# Patient Record
Sex: Female | Born: 1985 | Race: White | Hispanic: No | Marital: Married | Smoking: Former smoker
Health system: Southern US, Community
[De-identification: ages and names within clinical notes are randomized; demographics above are authoritative.]

## PROBLEM LIST (undated history)

## (undated) ENCOUNTER — Inpatient Hospital Stay (HOSPITAL_COMMUNITY): Payer: Self-pay

## (undated) DIAGNOSIS — F32A Depression, unspecified: Secondary | ICD-10-CM

## (undated) DIAGNOSIS — Z789 Other specified health status: Secondary | ICD-10-CM

## (undated) DIAGNOSIS — F329 Major depressive disorder, single episode, unspecified: Secondary | ICD-10-CM

## (undated) HISTORY — PX: TUBAL LIGATION: SHX77

## (undated) HISTORY — DX: Depression, unspecified: F32.A

## (undated) HISTORY — DX: Major depressive disorder, single episode, unspecified: F32.9

## (undated) HISTORY — DX: Other specified health status: Z78.9

## (undated) HISTORY — PX: WISDOM TOOTH EXTRACTION: SHX21

---

## 2012-09-14 ENCOUNTER — Emergency Department (INDEPENDENT_AMBULATORY_CARE_PROVIDER_SITE_OTHER)
Admission: EM | Admit: 2012-09-14 | Discharge: 2012-09-14 | Disposition: A | Discharge status: Home / Self Care | Payer: Managed Care, Other (non HMO) | Source: Home / Self Care | Attending: Family Medicine | Admitting: Family Medicine

## 2012-09-14 ENCOUNTER — Encounter: Payer: Self-pay | Admitting: *Deleted

## 2012-09-14 DIAGNOSIS — J069 Acute upper respiratory infection, unspecified: Secondary | ICD-10-CM

## 2012-09-14 DIAGNOSIS — R062 Wheezing: Secondary | ICD-10-CM

## 2012-09-14 MED ORDER — BENZONATATE 100 MG PO CAPS: 100.0000 mg | ORAL_CAPSULE | Freq: Three times a day (TID) | ORAL | Status: DC | PRN

## 2012-09-14 MED ORDER — METHYLPREDNISOLONE ACETATE 80 MG/ML IJ SUSP
80.0000 mg | Freq: Once | INTRAMUSCULAR | Status: AC
  Administered 2012-09-14: 80 mg via INTRAMUSCULAR

## 2012-09-14 NOTE — ED Notes (Signed)
Pt c/o HA and fatigue x 4 days. She also c/o productive cough x 2 days. Denies fever.

## 2012-09-14 NOTE — ED Provider Notes (Signed)
History     CSN: [id number]  Arrival date & time 09/14/12  2956   First MD Initiated Contact with Patient 09/14/12 [phone]      Chief Complaint  Patient presents with  . Cough   HPI  URI Symptoms Onset: 3-4 days  Description: rhinorrhea, nasal congestion, cough  Modifying factors:  Prior smoker (quit 1 year ago), has had some mild wheezing.   Symptoms Nasal discharge: yes Fever: no Sore throat: no Cough: yes Wheezing: yes Ear pain: yes GI symptoms: mild nausea  Sick contacts: yes  Red Flags  Stiff neck: no Dyspnea: no Rash: no Swallowing difficulty: no  Sinusitis Risk Factors Headache/face pain: no Double sickening: no tooth pain: no  Allergy Risk Factors Sneezing: yes Itchy scratchy throat: yes Seasonal symptoms: no  Flu Risk Factors Headache: no muscle aches: no severe fatigue: no   History reviewed. No pertinent past medical history.  History reviewed. No pertinent past surgical history.  Family History  Problem Relation Age of Onset  . Ulcerative colitis Father   . Seizures Sister     History  Substance Use Topics  . Smoking status: Former Games developer  . Smokeless tobacco: Not on file  . Alcohol Use: No    OB History   Grav Para Term Preterm Abortions TAB SAB Ect Mult Living                  Review of Systems  All other systems reviewed and are negative.    Allergies  Review of patient's allergies indicates no known allergies.  Home Medications   Current Outpatient Rx  Name  Route  Sig  Dispense  Refill  . benzonatate (TESSALON PERLES) 100 MG capsule   Oral   Take 1 capsule (100 mg total) by mouth 3 (three) times daily as needed for cough.   20 capsule   0     BP 136/72  Pulse 66  Temp(Src) 98.8 F (37.1 C) (Oral)  Ht 5\' 3"  (1.6 m)  Wt 156 lb (70.761 kg)  BMI 27.64 kg/m2  SpO2 99%  LMP 08/31/2012  Physical Exam  Constitutional: She appears well-developed and well-nourished.  HENT:  Head: Normocephalic and  atraumatic.  Right Ear: External ear normal.  Left Ear: External ear normal.  +nasal erythema, rhinorrhea bilaterally, + post oropharyngeal erythema    Eyes: Conjunctivae are normal. Pupils are equal, round, and reactive to light.  Neck: Normal range of motion. Neck supple.  Cardiovascular: Normal rate, regular rhythm and normal heart sounds.   Pulmonary/Chest: Effort normal.  Faint wheezes in bases    Abdominal: Soft.  Musculoskeletal: Normal range of motion.  Neurological: She is alert.  Skin: Skin is warm.    ED Course  Procedures (including critical care time)  Labs Reviewed - No data to display No results found.   1. URI (upper respiratory infection)   2. Wheezing       MDM  Suspect viral URI with some allergic overlap.  Discussed supportive care and infectious red flags. No clinical indication for abx currently.  Depomedrol for wheezing.  Tessalon perles for cough.  Discussed infectious and resp red flags.  Follow up as needed.     The patient and/or caregiver has been counseled thoroughly with regard to treatment plan and/or medications prescribed including dosage, schedule, interactions, rationale for use, and possible side effects and they verbalize understanding. Diagnoses and expected course of recovery discussed and will return if not improved as expected or if the condition  worsens. Patient and/or caregiver verbalized understanding.             Doree Albee, MD 09/14/12 [phone]

## 2012-11-30 ENCOUNTER — Emergency Department (INDEPENDENT_AMBULATORY_CARE_PROVIDER_SITE_OTHER)
Admission: EM | Admit: 2012-11-30 | Discharge: 2012-11-30 | Disposition: A | Discharge status: Home / Self Care | Payer: Managed Care, Other (non HMO) | Source: Home / Self Care | Attending: Family Medicine | Admitting: Family Medicine

## 2012-11-30 ENCOUNTER — Encounter: Payer: Self-pay | Admitting: *Deleted

## 2012-11-30 DIAGNOSIS — R6889 Other general symptoms and signs: Secondary | ICD-10-CM

## 2012-11-30 DIAGNOSIS — R509 Fever, unspecified: Secondary | ICD-10-CM

## 2012-11-30 DIAGNOSIS — J029 Acute pharyngitis, unspecified: Secondary | ICD-10-CM

## 2012-11-30 DIAGNOSIS — J111 Influenza due to unidentified influenza virus with other respiratory manifestations: Secondary | ICD-10-CM

## 2012-11-30 LAB — POCT RAPID STREP A (OFFICE): Rapid Strep A Screen: NEGATIVE

## 2012-11-30 MED ORDER — OSELTAMIVIR PHOSPHATE 75 MG PO CAPS: 75.0000 mg | ORAL_CAPSULE | Freq: Two times a day (BID) | ORAL | Status: DC

## 2012-11-30 NOTE — ED Notes (Signed)
Pt c/o sore throat, body aches, and chills x 3 days. Denies fever.

## 2012-11-30 NOTE — ED Provider Notes (Signed)
History     CSN: [id number]  Arrival date & time 11/30/12  1333   First MD Initiated Contact with Patient 11/30/12 1353      Chief Complaint  Patient presents with  . Sore Throat  . Generalized Body Aches  . Chills   HPI URI Symptoms Onset: 2 days  Description: rhinorrhea, congestion, generalized malaise, chills  Modifying factors: Has not had flu shot    Symptoms Nasal discharge: yes Fever: yes Sore throat: no Cough: yes Wheezing: no Ear pain: no GI symptoms: no Sick contacts: no  Red Flags  Stiff neck: no Dyspnea: nn Rash: no Swallowing difficulty: no  Sinusitis Risk Factors Headache/face pain: no Double sickening: no tooth pain: no  Allergy Risk Factors Sneezing: no Itchy scratchy throat: no Seasonal symptoms: no  Flu Risk Factors Headache: yes muscle aches: yes severe fatigue: yes     History reviewed. No pertinent past medical history.  History reviewed. No pertinent past surgical history.  Family History  Problem Relation Age of Onset  . Ulcerative colitis Father   . Seizures Sister     History  Substance Use Topics  . Smoking status: Former Games developer  . Smokeless tobacco: Not on file  . Alcohol Use: No    OB History   Grav Para Term Preterm Abortions TAB SAB Ect Mult Living                  Review of Systems  All other systems reviewed and are negative.    Allergies  Review of patient's allergies indicates no known allergies.  Home Medications   Current Outpatient Rx  Name  Route  Sig  Dispense  Refill  . benzonatate (TESSALON PERLES) 100 MG capsule   Oral   Take 1 capsule (100 mg total) by mouth 3 (three) times daily as needed for cough.   20 capsule   0     BP 121/76  Pulse 93  Temp(Src) 99.6 F (37.6 C) (Oral)  Resp 16  Wt 157 lb (71.215 kg)  BMI 27.82 kg/m2  SpO2 99%  Physical Exam  Constitutional:  Mildly ill appearing    HENT:  Head: Normocephalic and atraumatic.  Eyes: Conjunctivae are normal.  Pupils are equal, round, and reactive to light.  Neck: Normal range of motion. Neck supple.  Cardiovascular: Normal rate and regular rhythm.   Pulmonary/Chest: Effort normal.  Abdominal: Soft.  Musculoskeletal: Normal range of motion.  Neurological: She is alert.  Skin: Skin is warm.    ED Course  Procedures (including critical care time)  Labs Reviewed  STREP A DNA PROBE   Narrative:    Performed at:  [facility]                [address]                [place], [place] [address]  POCT RAPID STREP A (OFFICE)  POCT MONO SCREEN ([facility])   No results found.   1. Flu-like symptoms       MDM  Flu like illness Rapid strep and mono negative.  Tamiflu  Tessalon perles.  Discussed infectious and resp red flags.  Follow up as needed.     The patient and/or caregiver has been counseled thoroughly with regard to treatment plan and/or medications prescribed including dosage, schedule, interactions, rationale for use, and possible side effects and they verbalize understanding. Diagnoses and expected course of recovery discussed and will return if not  improved as expected or if the condition worsens. Patient and/or caregiver verbalized understanding.             Doree Albee, MD 12/09/12 [phone]

## 2012-12-01 LAB — STREP A DNA PROBE: GASP: NEGATIVE

## 2012-12-03 ENCOUNTER — Telehealth: Payer: Self-pay | Admitting: Emergency Medicine

## 2013-06-29 ENCOUNTER — Encounter: Payer: Self-pay | Admitting: Emergency Medicine

## 2013-06-29 ENCOUNTER — Emergency Department (INDEPENDENT_AMBULATORY_CARE_PROVIDER_SITE_OTHER)
Admission: EM | Admit: 2013-06-29 | Discharge: 2013-06-29 | Disposition: A | Discharge status: Home / Self Care | Payer: Managed Care, Other (non HMO) | Source: Home / Self Care | Attending: Family Medicine | Admitting: Family Medicine

## 2013-06-29 DIAGNOSIS — J029 Acute pharyngitis, unspecified: Secondary | ICD-10-CM

## 2013-06-29 LAB — POCT RAPID STREP A (OFFICE): Rapid Strep A Screen: NEGATIVE

## 2013-06-29 MED ORDER — PREDNISONE 20 MG PO TABS: 20.0000 mg | ORAL_TABLET | Freq: Two times a day (BID) | ORAL | Status: DC

## 2013-06-29 NOTE — ED Notes (Signed)
Reports onset sore throat within past 24 hours. Has not had Flu vaccination this season. Did get treated for Flu 11/2012.

## 2013-06-29 NOTE — ED Provider Notes (Signed)
CSN: [id number]     Arrival date & time 06/29/13  1340 History   First MD Initiated Contact with Patient 06/29/13 1506     Chief Complaint  Patient presents with  . Sore Throat     HPI Comments: Patient developed a right-side sore throat yesterday.  She has been fatigued, with headache and sweats.  The history is provided by the patient.    History reviewed. No pertinent past medical history. History reviewed. No pertinent past surgical history. Family History  Problem Relation Age of Onset  . Ulcerative colitis Father   . Seizures Sister    History  Substance Use Topics  . Smoking status: Former Games developer  . Smokeless tobacco: Not on file  . Alcohol Use: No   OB History   Grav Para Term Preterm Abortions TAB SAB Ect Mult Living                 Review of Systems + sore throat No cough No pleuritic pain No wheezing No nasal congestion ? post-nasal drainage No sinus pain/pressure No itchy/red eyes ? earache No hemoptysis No SOB No fever/chills; + sweats No nausea No vomiting No abdominal pain No diarrhea No urinary symptoms No skin rash + fatigue No myalgias + headache Used OTC meds without relief  Allergies  Review of patient's allergies indicates no known allergies.  Home Medications   Current Outpatient Rx  Name  Route  Sig  Dispense  Refill  . benzonatate (TESSALON PERLES) 100 MG capsule   Oral   Take 1 capsule (100 mg total) by mouth 3 (three) times daily as needed for cough.   20 capsule   0   . oseltamivir (TAMIFLU) 75 MG capsule   Oral   Take 1 capsule (75 mg total) by mouth 2 (two) times daily.   10 capsule   0   . predniSONE (DELTASONE) 20 MG tablet   Oral   Take 1 tablet (20 mg total) by mouth 2 (two) times daily.   10 tablet   0    BP 119/79  Pulse 66  Temp(Src) 98.1 F (36.7 C) (Oral)  Resp 16  Ht 5\' 4"  (1.626 m)  Wt 150 lb (68.04 kg)  BMI 25.73 kg/m2  SpO2 100%  LMP 05/31/2013 Physical Exam Nursing notes and Vital  Signs reviewed. Appearance:  Patient appears healthy, stated age, and in no acute distress Eyes:  Pupils are equal, round, and reactive to light and accomodation.  Extraocular movement is intact.  Conjunctivae are not inflamed  Ears:  Canals normal.  Tympanic membranes normal.  Nose:  Mildly congested turbinates.  No sinus tenderness.   Pharynx:  Normal Neck:  Supple.  Nontender shotty posterior nodes are palpated bilaterally  Lungs:  Clear to auscultation.  Breath sounds are equal.  Heart:  Regular rate and rhythm without murmurs, rubs, or gallops.  Abdomen:  Nontender without masses or hepatosplenomegaly.  Bowel sounds are present.  No CVA or flank tenderness.  Extremities:  No edema.  No calf tenderness Skin:  No rash present.   ED Course  Procedures  none    Labs Reviewed  STREP A DNA PROBE  POCT RAPID STREP A (OFFICE) negative         MDM   1. Acute pharyngitis; suspect early viral URI    Throat culture pending. Try warm salt water gargles. If cold like symptoms begin: Take Mucinex D (guaifenesin with decongestant) twice daily for congestion.  Increase fluid intake, rest. May  use Afrin nasal spray (or generic oxymetazoline) twice daily for about 5 days.  Also recommend using saline nasal spray several times daily and saline nasal irrigation (AYR is a common brand) May take Delsym Cough Suppressant at bedtime for nighttime cough.  Stop all antihistamines for now, and other non-prescription cough/cold preparations. Follow-up with family doctor if not improving about 7 to10 days.     Lattie Haw, MD 07/01/13 2003

## 2014-08-04 NOTE — L&D Delivery Note (Signed)
Patient is 29 y.o. [id number] 7994w3d admitted for PD-IOL, uncomplicated pregnancy. Patient progressed well and had epidural placed. She required a manual extraction of placenta and the specimen appeared intact. Of note the patient was noted to be a marginal placenta on US.   Delivery Note At 9:04 PM a viable female was delivered via Vaginal, Spontaneous Delivery (Presentation: Right Occiput Anterior).  APGAR: 9, 9; weight 8 lb 14.7 oz (4045 g).   Placenta status: Intact, Manual removal.  Cord: 3 vessels with the following complications: None.  Cord pH: n/a  Anesthesia: Epidural  Episiotomy: None Lacerations: None Suture Repair: NA Est. Blood Loss (mL): 150  Mom to postpartum.  Baby to Couplet care / Skin to Skin.  Isa RankinKimberly Niles Raynaldo Falco 05/22/2015, 12:20 AM

## 2014-09-25 ENCOUNTER — Other Ambulatory Visit: Payer: Self-pay | Admitting: Advanced Practice Midwife

## 2014-09-25 ENCOUNTER — Ambulatory Visit (INDEPENDENT_AMBULATORY_CARE_PROVIDER_SITE_OTHER): Payer: Managed Care, Other (non HMO) | Admitting: Advanced Practice Midwife

## 2014-09-25 ENCOUNTER — Encounter: Payer: Self-pay | Admitting: Advanced Practice Midwife

## 2014-09-25 VITALS — BP 134/77 | HR 67 | Wt 144.0 lb

## 2014-09-25 DIAGNOSIS — Z124 Encounter for screening for malignant neoplasm of cervix: Secondary | ICD-10-CM

## 2014-09-25 DIAGNOSIS — Z3481 Encounter for supervision of other normal pregnancy, first trimester: Secondary | ICD-10-CM | POA: Diagnosis not present

## 2014-09-25 DIAGNOSIS — Z23 Encounter for immunization: Secondary | ICD-10-CM

## 2014-09-25 DIAGNOSIS — Z3491 Encounter for supervision of normal pregnancy, unspecified, first trimester: Secondary | ICD-10-CM | POA: Insufficient documentation

## 2014-09-25 LAB — OB RESULTS CONSOLE GC/CHLAMYDIA
Chlamydia: NEGATIVE
GC PROBE AMP, GENITAL: NEGATIVE

## 2014-09-25 MED ORDER — INFLUENZA VAC SPLIT QUAD 0.5 ML IM SUSY
0.5000 mL | PREFILLED_SYRINGE | Freq: Once | INTRAMUSCULAR | Status: AC
  Administered 2014-09-25: 0.5 mL via INTRAMUSCULAR

## 2014-09-25 NOTE — Patient Instructions (Signed)
First Trimester of Pregnancy The first trimester of pregnancy is from week 1 until the end of week 12 (months 1 through 3). A week after a sperm fertilizes an egg, the egg will implant on the wall of the uterus. This embryo will begin to develop into a baby. Genes from you and your partner are forming the baby. The female genes determine whether the baby is a boy or a girl. At 6-8 weeks, the eyes and face are formed, and the heartbeat can be seen on ultrasound. At the end of 12 weeks, all the baby's organs are formed.  Now that you are pregnant, you will want to do everything you can to have a healthy baby. Two of the most important things are to get good prenatal care and to follow your health care provider's instructions. Prenatal care is all the medical care you receive before the baby's birth. This care will help prevent, find, and treat any problems during the pregnancy and childbirth. BODY CHANGES Your body goes through many changes during pregnancy. The changes vary from woman to woman.   You may gain or lose a couple of pounds at first.  You may feel sick to your stomach (nauseous) and throw up (vomit). If the vomiting is uncontrollable, call your health care provider.  You may tire easily.  You may develop headaches that can be relieved by medicines approved by your health care provider.  You may urinate more often. Painful urination may mean you have a bladder infection.  You may develop heartburn as a result of your pregnancy.  You may develop constipation because certain hormones are causing the muscles that push waste through your intestines to slow down.  You may develop hemorrhoids or swollen, bulging veins (varicose veins).  Your breasts may begin to grow larger and become tender. Your nipples may stick out more, and the tissue that surrounds them (areola) may become darker.  Your gums may bleed and may be sensitive to brushing and flossing.  Dark spots or blotches (chloasma,  mask of pregnancy) may develop on your face. This will likely fade after the baby is born.  Your menstrual periods will stop.  You may have a loss of appetite.  You may develop cravings for certain kinds of food.  You may have changes in your emotions from day to day, such as being excited to be pregnant or being concerned that something may go wrong with the pregnancy and baby.  You may have more vivid and strange dreams.  You may have changes in your hair. These can include thickening of your hair, rapid growth, and changes in texture. Some women also have hair loss during or after pregnancy, or hair that feels dry or thin. Your hair will most likely return to normal after your baby is born. WHAT TO EXPECT AT YOUR PRENATAL VISITS During a routine prenatal visit:  You will be weighed to make sure you and the baby are growing normally.  Your blood pressure will be taken.  Your abdomen will be measured to track your baby's growth.  The fetal heartbeat will be listened to starting around week 10 or 12 of your pregnancy.  Test results from any previous visits will be discussed. Your health care provider may ask you:  How you are feeling.  If you are feeling the baby move.  If you have had any abnormal symptoms, such as leaking fluid, bleeding, severe headaches, or abdominal cramping.  If you have any questions. Other tests   that may be performed during your first trimester include:  Blood tests to find your blood type and to check for the presence of any previous infections. They will also be used to check for low iron levels (anemia) and Rh antibodies. Later in the pregnancy, blood tests for diabetes will be done along with other tests if problems develop.  Urine tests to check for infections, diabetes, or protein in the urine.  An ultrasound to confirm the proper growth and development of the baby.  An amniocentesis to check for possible genetic problems.  Fetal screens for  spina bifida and Down syndrome.  You may need other tests to make sure you and the baby are doing well. HOME CARE INSTRUCTIONS  Medicines  Follow your health care provider's instructions regarding medicine use. Specific medicines may be either safe or unsafe to take during pregnancy.  Take your prenatal vitamins as directed.  If you develop constipation, try taking a stool softener if your health care provider approves. Diet  Eat regular, well-balanced meals. Choose a variety of foods, such as meat or vegetable-based protein, fish, milk and low-fat dairy products, vegetables, fruits, and whole grain breads and cereals. Your health care provider will help you determine the amount of weight gain that is right for you.  Avoid raw meat and uncooked cheese. These carry germs that can cause birth defects in the baby.  Eating four or five small meals rather than three large meals a day may help relieve nausea and vomiting. If you start to feel nauseous, eating a few soda crackers can be helpful. Drinking liquids between meals instead of during meals also seems to help nausea and vomiting.  If you develop constipation, eat more high-fiber foods, such as fresh vegetables or fruit and whole grains. Drink enough fluids to keep your urine clear or pale yellow. Activity and Exercise  Exercise only as directed by your health care provider. Exercising will help you:  Control your weight.  Stay in shape.  Be prepared for labor and delivery.  Experiencing pain or cramping in the lower abdomen or low back is a good sign that you should stop exercising. Check with your health care provider before continuing normal exercises.  Try to avoid standing for long periods of time. Move your legs often if you must stand in one place for a long time.  Avoid heavy lifting.  Wear low-heeled shoes, and practice good posture.  You may continue to have sex unless your health care provider directs you  otherwise. Relief of Pain or Discomfort  Wear a good support bra for breast tenderness.   Take warm sitz baths to soothe any pain or discomfort caused by hemorrhoids. Use hemorrhoid cream if your health care provider approves.   Rest with your legs elevated if you have leg cramps or low back pain.  If you develop varicose veins in your legs, wear support hose. Elevate your feet for 15 minutes, 3-4 times a day. Limit salt in your diet. Prenatal Care  Schedule your prenatal visits by the twelfth week of pregnancy. They are usually scheduled monthly at first, then more often in the last 2 months before delivery.  Write down your questions. Take them to your prenatal visits.  Keep all your prenatal visits as directed by your health care provider. Safety  Wear your seat belt at all times when driving.  Make a list of emergency phone numbers, including numbers for family, friends, the hospital, and police and fire departments. General Tips    Ask your health care provider for a referral to a local prenatal education class. Begin classes no later than at the beginning of month 6 of your pregnancy.  Ask for help if you have counseling or nutritional needs during pregnancy. Your health care provider can offer advice or refer you to specialists for help with various needs.  Do not use hot tubs, steam rooms, or saunas.  Do not douche or use tampons or scented sanitary pads.  Do not cross your legs for long periods of time.  Avoid cat litter boxes and soil used by cats. These carry germs that can cause birth defects in the baby and possibly loss of the fetus by miscarriage or stillbirth.  Avoid all smoking, herbs, alcohol, and medicines not prescribed by your health care provider. Chemicals in these affect the formation and growth of the baby.  Schedule a dentist appointment. At home, brush your teeth with a soft toothbrush and be gentle when you floss. SEEK MEDICAL CARE IF:   You have  dizziness.  You have mild pelvic cramps, pelvic pressure, or nagging pain in the abdominal area.  You have persistent nausea, vomiting, or diarrhea.  You have a bad smelling vaginal discharge.  You have pain with urination.  You notice increased swelling in your face, hands, legs, or ankles. SEEK IMMEDIATE MEDICAL CARE IF:   You have a fever.  You are leaking fluid from your vagina.  You have spotting or bleeding from your vagina.  You have severe abdominal cramping or pain.  You have rapid weight gain or loss.  You vomit blood or material that looks like coffee grounds.  You are exposed to German measles and have never had them.  You are exposed to fifth disease or chickenpox.  You develop a severe headache.  You have shortness of breath.  You have any kind of trauma, such as from a fall or a car accident. Document Released: 07/15/2001 Document Revised: 12/05/2013 Document Reviewed: 05/31/2013 Exit[employer] Patient Information 2015 [employer]. This information is not intended to replace advice given to you by your health care provider. Make sure you discuss any questions you have with your health care provider.  

## 2014-09-25 NOTE — Addendum Note (Signed)
Addended by: Granville LewisLARK, Gari Trovato L on: 09/25/2014 01:28 PM   Modules accepted: Orders

## 2014-09-25 NOTE — Progress Notes (Signed)
Pt has irregular periods and Beside U/S today shows IUP with CRL of12.811mm and GA is 6639w3d

## 2014-09-25 NOTE — Progress Notes (Signed)
   Subjective:    Bonnie White is a G2P1001 8356w3d by US today being seen today for her first obstetrical visit. Irreg uncertain LMP.   Her obstetrical history is significant for nothing. . Patient does intend to breast feed. Pregnancy history fully reviewed.  Patient reports no bleeding and no cramping.  Filed Vitals:   09/25/14 1120  BP: 134/77  Pulse: 67  Weight: 144 lb (65.318 kg)    HISTORY: OB History  Gravida Para Term Preterm AB SAB TAB Ectopic Multiple Living  2 1 1       1     # Outcome Date GA Lbr Len/2nd Weight Sex Delivery Anes PTL Lv  2 Current           1 Term 06/26/08    F Vag-Spont  Y Y     No past medical history on file. Past Surgical History  Procedure Laterality Date  . Wisdom tooth extraction     Family History  Problem Relation Age of Onset  . Ulcerative colitis Father   . Seizures Sister      Exam    Uterus:   8-week size. Pos cardiac activity per US today.  Pelvic Exam:    Perineum: No Hemorrhoids, Normal Perineum   Vulva: normal   Vagina:  normal mucosa, normal discharge   pH: NA   Cervix: multiparous appearance and scant bleeding following Pap, Cervix anterior, displaced to left.   Adnexa: normal adnexa and no mass, fullness, tenderness   Bony Pelvis: average  System: Breast:  normal appearance, no masses or tenderness, Taught monthly breast self examination   Skin: normal coloration and turgor, no rashes    Neurologic: oriented, normal mood, grossly non-focal   Extremities: normal strength, tone, and muscle mass   HEENT sclera clear, anicteric   Mouth/Teeth dental hygiene good   Neck supple and no masses   Cardiovascular: regular rate and rhythm   Respiratory:  appears well, vitals normal, no respiratory distress, acyanotic, normal RR, chest clear, no wheezing, crepitations, rhonchi, normal symmetric air entry   Abdomen: soft, non-tender; bowel sounds normal; no masses,  no organomegaly   Urinary: urethral meatus normal       Assessment:    Pregnancy: G2P1001 Patient Active Problem List   Diagnosis Date Noted  . Supervision of normal pregnancy in first trimester 09/25/2014        Plan:     Initial labs drawn. Prenatal vitamins. Problem list reviewed and updated. Genetic Screening discussed First Screen: ordered.  Ultrasound discussed; fetal survey: requested.  Follow up in 4 weeks. 50% of 30 min visit spent on counseling and coordination of care.  Flu vaccine today. Desires Waterbirth. Info given. Discussed class requirement and contraindications.    Dorathy KinsmanSMITH, Bonnie White 09/25/2014

## 2014-09-26 LAB — OBSTETRIC PANEL
Antibody Screen: NEGATIVE
Basophils Absolute: 0 10*3/uL (ref 0.0–0.1)
Basophils Relative: 0 % (ref 0–1)
Eosinophils Absolute: 0.1 10*3/uL (ref 0.0–0.7)
Eosinophils Relative: 1 % (ref 0–5)
HCT: 36.4 % (ref 36.0–46.0)
HEMOGLOBIN: 11.9 g/dL — AB (ref 12.0–15.0)
HEP B S AG: NEGATIVE
LYMPHS ABS: 2.2 10*3/uL (ref 0.7–4.0)
LYMPHS PCT: 32 % (ref 12–46)
MCH: 31.2 pg (ref 26.0–34.0)
MCHC: 32.7 g/dL (ref 30.0–36.0)
MCV: 95.3 fL (ref 78.0–100.0)
MPV: 11.3 fL (ref 8.6–12.4)
Monocytes Absolute: 0.4 10*3/uL (ref 0.1–1.0)
Monocytes Relative: 6 % (ref 3–12)
NEUTROS PCT: 61 % (ref 43–77)
Neutro Abs: 4.3 10*3/uL (ref 1.7–7.7)
Platelets: 191 10*3/uL (ref 150–400)
RBC: 3.82 MIL/uL — AB (ref 3.87–5.11)
RDW: 12.8 % (ref 11.5–15.5)
RUBELLA: 1.99 {index} — AB (ref <0.90)
Rh Type: POSITIVE
WBC: 7 10*3/uL (ref 4.0–10.5)

## 2014-09-26 LAB — GC/CHLAMYDIA PROBE AMP
CT Probe RNA: NEGATIVE
GC Probe RNA: NEGATIVE

## 2014-09-26 LAB — CYSTIC FIBROSIS DIAGNOSTIC STUDY

## 2014-09-26 LAB — HIV ANTIBODY (ROUTINE TESTING W REFLEX): HIV: NONREACTIVE

## 2014-09-27 LAB — CULTURE, URINE COMPREHENSIVE
Colony Count: NO GROWTH
ORGANISM ID, BACTERIA: NO GROWTH

## 2014-09-28 LAB — CYTOLOGY - PAP

## 2014-10-23 ENCOUNTER — Ambulatory Visit (INDEPENDENT_AMBULATORY_CARE_PROVIDER_SITE_OTHER): Payer: Managed Care, Other (non HMO) | Admitting: Advanced Practice Midwife

## 2014-10-23 VITALS — BP 123/76 | HR 79 | Wt 150.0 lb

## 2014-10-23 DIAGNOSIS — Z3491 Encounter for supervision of normal pregnancy, unspecified, first trimester: Secondary | ICD-10-CM

## 2014-10-23 DIAGNOSIS — Z3481 Encounter for supervision of other normal pregnancy, first trimester: Secondary | ICD-10-CM

## 2014-10-23 NOTE — Patient Instructions (Signed)
Second Trimester of Pregnancy The second trimester is from week 13 through week 28, months 4 through 6. The second trimester is often a time when you feel your best. Your body has also adjusted to being pregnant, and you begin to feel better physically. Usually, morning sickness has lessened or quit completely, you may have more energy, and you may have an increase in appetite. The second trimester is also a time when the fetus is growing rapidly. At the end of the sixth month, the fetus is about 9 inches long and weighs about 1 pounds. You will likely begin to feel the baby move (quickening) between 18 and 20 weeks of the pregnancy. BODY CHANGES Your body goes through many changes during pregnancy. The changes vary from woman to woman.   Your weight will continue to increase. You will notice your lower abdomen bulging out.  You may begin to get stretch marks on your hips, abdomen, and breasts.  You may develop headaches that can be relieved by medicines approved by your health care provider.  You may urinate more often because the fetus is pressing on your bladder.  You may develop or continue to have heartburn as a result of your pregnancy.  You may develop constipation because certain hormones are causing the muscles that push waste through your intestines to slow down.  You may develop hemorrhoids or swollen, bulging veins (varicose veins).  You may have back pain because of the weight gain and pregnancy hormones relaxing your joints between the bones in your pelvis and as a result of a shift in weight and the muscles that support your balance.  Your breasts will continue to grow and be tender.  Your gums may bleed and may be sensitive to brushing and flossing.  Dark spots or blotches (chloasma, mask of pregnancy) may develop on your face. This will likely fade after the baby is born.  A dark line from your belly button to the pubic area (linea nigra) may appear. This will likely fade  after the baby is born.  You may have changes in your hair. These can include thickening of your hair, rapid growth, and changes in texture. Some women also have hair loss during or after pregnancy, or hair that feels dry or thin. Your hair will most likely return to normal after your baby is born. WHAT TO EXPECT AT YOUR PRENATAL VISITS During a routine prenatal visit:  You will be weighed to make sure you and the fetus are growing normally.  Your blood pressure will be taken.  Your abdomen will be measured to track your baby's growth.  The fetal heartbeat will be listened to.  Any test results from the previous visit will be discussed. Your health care provider may ask you:  How you are feeling.  If you are feeling the baby move.  If you have had any abnormal symptoms, such as leaking fluid, bleeding, severe headaches, or abdominal cramping.  If you have any questions. Other tests that may be performed during your second trimester include:  Blood tests that check for:  Low iron levels (anemia).  Gestational diabetes (between 24 and 28 weeks).  Rh antibodies.  Urine tests to check for infections, diabetes, or protein in the urine.  An ultrasound to confirm the proper growth and development of the baby.  An amniocentesis to check for possible genetic problems.  Fetal screens for spina bifida and Down syndrome. HOME CARE INSTRUCTIONS   Avoid all smoking, herbs, alcohol, and unprescribed   drugs. These chemicals affect the formation and growth of the baby.  Follow your health care provider's instructions regarding medicine use. There are medicines that are either safe or unsafe to take during pregnancy.  Exercise only as directed by your health care provider. Experiencing uterine cramps is a good sign to stop exercising.  Continue to eat regular, healthy meals.  Wear a good support bra for breast tenderness.  Do not use hot tubs, steam rooms, or saunas.  Wear your  seat belt at all times when driving.  Avoid raw meat, uncooked cheese, cat litter boxes, and soil used by cats. These carry germs that can cause birth defects in the baby.  Take your prenatal vitamins.  Try taking a stool softener (if your health care provider approves) if you develop constipation. Eat more high-fiber foods, such as fresh vegetables or fruit and whole grains. Drink plenty of fluids to keep your urine clear or pale yellow.  Take warm sitz baths to soothe any pain or discomfort caused by hemorrhoids. Use hemorrhoid cream if your health care provider approves.  If you develop varicose veins, wear support hose. Elevate your feet for 15 minutes, 3-4 times a day. Limit salt in your diet.  Avoid heavy lifting, wear low heel shoes, and practice good posture.  Rest with your legs elevated if you have leg cramps or low back pain.  Visit your dentist if you have not gone yet during your pregnancy. Use a soft toothbrush to brush your teeth and be gentle when you floss.  A sexual relationship may be continued unless your health care provider directs you otherwise.  Continue to go to all your prenatal visits as directed by your health care provider. SEEK MEDICAL CARE IF:   You have dizziness.  You have mild pelvic cramps, pelvic pressure, or nagging pain in the abdominal area.  You have persistent nausea, vomiting, or diarrhea.  You have a bad smelling vaginal discharge.  You have pain with urination. SEEK IMMEDIATE MEDICAL CARE IF:   You have a fever.  You are leaking fluid from your vagina.  You have spotting or bleeding from your vagina.  You have severe abdominal cramping or pain.  You have rapid weight gain or loss.  You have shortness of breath with chest pain.  You notice sudden or extreme swelling of your face, hands, ankles, feet, or legs.  You have not felt your baby move in over an hour.  You have severe headaches that do not go away with  medicine.  You have vision changes. Document Released: 07/15/2001 Document Revised: 07/26/2013 Document Reviewed: 09/21/2012 [employer] Patient Information 2015 [employer]. This information is not intended to replace advice given to you by your health care provider. Make sure you discuss any questions you have with your health care provider.  

## 2014-10-23 NOTE — Progress Notes (Signed)
First trimester screen scheduled 3/28. Reviewed NOB labs.

## 2014-10-30 ENCOUNTER — Encounter (HOSPITAL_COMMUNITY): Payer: Self-pay

## 2014-10-30 ENCOUNTER — Ambulatory Visit (HOSPITAL_COMMUNITY)
Admission: RE | Admit: 2014-10-30 | Discharge: 2014-10-30 | Disposition: A | Discharge status: Home / Self Care | Payer: Managed Care, Other (non HMO) | Source: Ambulatory Visit | Attending: Advanced Practice Midwife | Admitting: Advanced Practice Midwife

## 2014-10-30 DIAGNOSIS — Z36 Encounter for antenatal screening of mother: Secondary | ICD-10-CM | POA: Insufficient documentation

## 2014-10-30 DIAGNOSIS — Z3A12 12 weeks gestation of pregnancy: Secondary | ICD-10-CM | POA: Diagnosis not present

## 2014-10-30 DIAGNOSIS — Z3491 Encounter for supervision of normal pregnancy, unspecified, first trimester: Secondary | ICD-10-CM

## 2014-11-03 DIAGNOSIS — Z3A12 12 weeks gestation of pregnancy: Secondary | ICD-10-CM | POA: Insufficient documentation

## 2014-11-03 DIAGNOSIS — Z3682 Encounter for antenatal screening for nuchal translucency: Secondary | ICD-10-CM | POA: Insufficient documentation

## 2014-11-07 ENCOUNTER — Encounter: Payer: Self-pay | Admitting: *Deleted

## 2014-11-07 ENCOUNTER — Other Ambulatory Visit (HOSPITAL_COMMUNITY): Payer: Self-pay | Admitting: Advanced Practice Midwife

## 2014-11-20 ENCOUNTER — Ambulatory Visit (INDEPENDENT_AMBULATORY_CARE_PROVIDER_SITE_OTHER): Payer: Managed Care, Other (non HMO) | Admitting: Advanced Practice Midwife

## 2014-11-20 VITALS — BP 110/63 | HR 63 | Wt 152.0 lb

## 2014-11-20 DIAGNOSIS — Z3492 Encounter for supervision of normal pregnancy, unspecified, second trimester: Secondary | ICD-10-CM

## 2014-11-20 DIAGNOSIS — Z36 Encounter for antenatal screening of mother: Secondary | ICD-10-CM

## 2014-11-20 NOTE — Progress Notes (Signed)
Doing well. Informed first screen normal Will do AFP only today.  Schedule US for 20 wks.

## 2014-11-20 NOTE — Patient Instructions (Signed)
Second Trimester of Pregnancy The second trimester is from week 13 through week 28, months 4 through 6. The second trimester is often a time when you feel your best. Your body has also adjusted to being pregnant, and you begin to feel better physically. Usually, morning sickness has lessened or quit completely, you may have more energy, and you may have an increase in appetite. The second trimester is also a time when the fetus is growing rapidly. At the end of the sixth month, the fetus is about 9 inches long and weighs about 1 pounds. You will likely begin to feel the baby move (quickening) between 18 and 20 weeks of the pregnancy. BODY CHANGES Your body goes through many changes during pregnancy. The changes vary from woman to woman.   Your weight will continue to increase. You will notice your lower abdomen bulging out.  You may begin to get stretch marks on your hips, abdomen, and breasts.  You may develop headaches that can be relieved by medicines approved by your health care provider.  You may urinate more often because the fetus is pressing on your bladder.  You may develop or continue to have heartburn as a result of your pregnancy.  You may develop constipation because certain hormones are causing the muscles that push waste through your intestines to slow down.  You may develop hemorrhoids or swollen, bulging veins (varicose veins).  You may have back pain because of the weight gain and pregnancy hormones relaxing your joints between the bones in your pelvis and as a result of a shift in weight and the muscles that support your balance.  Your breasts will continue to grow and be tender.  Your gums may bleed and may be sensitive to brushing and flossing.  Dark spots or blotches (chloasma, mask of pregnancy) may develop on your face. This will likely fade after the baby is born.  A dark line from your belly button to the pubic area (linea nigra) may appear. This will likely fade  after the baby is born.  You may have changes in your hair. These can include thickening of your hair, rapid growth, and changes in texture. Some women also have hair loss during or after pregnancy, or hair that feels dry or thin. Your hair will most likely return to normal after your baby is born. WHAT TO EXPECT AT YOUR PRENATAL VISITS During a routine prenatal visit:  You will be weighed to make sure you and the fetus are growing normally.  Your blood pressure will be taken.  Your abdomen will be measured to track your baby's growth.  The fetal heartbeat will be listened to.  Any test results from the previous visit will be discussed. Your health care provider may ask you:  How you are feeling.  If you are feeling the baby move.  If you have had any abnormal symptoms, such as leaking fluid, bleeding, severe headaches, or abdominal cramping.  If you have any questions. Other tests that may be performed during your second trimester include:  Blood tests that check for:  Low iron levels (anemia).  Gestational diabetes (between 24 and 28 weeks).  Rh antibodies.  Urine tests to check for infections, diabetes, or protein in the urine.  An ultrasound to confirm the proper growth and development of the baby.  An amniocentesis to check for possible genetic problems.  Fetal screens for spina bifida and Down syndrome. HOME CARE INSTRUCTIONS   Avoid all smoking, herbs, alcohol, and unprescribed   drugs. These chemicals affect the formation and growth of the baby.  Follow your health care provider's instructions regarding medicine use. There are medicines that are either safe or unsafe to take during pregnancy.  Exercise only as directed by your health care provider. Experiencing uterine cramps is a good sign to stop exercising.  Continue to eat regular, healthy meals.  Wear a good support bra for breast tenderness.  Do not use hot tubs, steam rooms, or saunas.  Wear your  seat belt at all times when driving.  Avoid raw meat, uncooked cheese, cat litter boxes, and soil used by cats. These carry germs that can cause birth defects in the baby.  Take your prenatal vitamins.  Try taking a stool softener (if your health care provider approves) if you develop constipation. Eat more high-fiber foods, such as fresh vegetables or fruit and whole grains. Drink plenty of fluids to keep your urine clear or pale yellow.  Take warm sitz baths to soothe any pain or discomfort caused by hemorrhoids. Use hemorrhoid cream if your health care provider approves.  If you develop varicose veins, wear support hose. Elevate your feet for 15 minutes, 3-4 times a day. Limit salt in your diet.  Avoid heavy lifting, wear low heel shoes, and practice good posture.  Rest with your legs elevated if you have leg cramps or low back pain.  Visit your dentist if you have not gone yet during your pregnancy. Use a soft toothbrush to brush your teeth and be gentle when you floss.  A sexual relationship may be continued unless your health care provider directs you otherwise.  Continue to go to all your prenatal visits as directed by your health care provider. SEEK MEDICAL CARE IF:   You have dizziness.  You have mild pelvic cramps, pelvic pressure, or nagging pain in the abdominal area.  You have persistent nausea, vomiting, or diarrhea.  You have a bad smelling vaginal discharge.  You have pain with urination. SEEK IMMEDIATE MEDICAL CARE IF:   You have a fever.  You are leaking fluid from your vagina.  You have spotting or bleeding from your vagina.  You have severe abdominal cramping or pain.  You have rapid weight gain or loss.  You have shortness of breath with chest pain.  You notice sudden or extreme swelling of your face, hands, ankles, feet, or legs.  You have not felt your baby move in over an hour.  You have severe headaches that do not go away with  medicine.  You have vision changes. Document Released: 07/15/2001 Document Revised: 07/26/2013 Document Reviewed: 09/21/2012 [employer] Patient Information 2015 [employer]. This information is not intended to replace advice given to you by your health care provider. Make sure you discuss any questions you have with your health care provider.  

## 2014-11-21 LAB — ALPHA FETOPROTEIN, MATERNAL
AFP: 54.6 ng/mL
CURR GEST AGE: 15.3 wks.days
MOM FOR AFP: 1.85
Open Spina bifida: NEGATIVE
Osb Risk: 1:1090 {titer}

## 2014-12-15 ENCOUNTER — Ambulatory Visit (HOSPITAL_COMMUNITY)
Admission: RE | Admit: 2014-12-15 | Discharge: 2014-12-15 | Disposition: A | Discharge status: Home / Self Care | Source: Ambulatory Visit | Attending: Advanced Practice Midwife | Admitting: Advanced Practice Midwife

## 2014-12-15 ENCOUNTER — Other Ambulatory Visit: Payer: Self-pay | Admitting: Advanced Practice Midwife

## 2014-12-15 DIAGNOSIS — Z3492 Encounter for supervision of normal pregnancy, unspecified, second trimester: Secondary | ICD-10-CM

## 2014-12-15 DIAGNOSIS — Z3A19 19 weeks gestation of pregnancy: Secondary | ICD-10-CM | POA: Diagnosis not present

## 2014-12-15 DIAGNOSIS — Z0372 Encounter for suspected placental problem ruled out: Secondary | ICD-10-CM | POA: Insufficient documentation

## 2014-12-15 DIAGNOSIS — Z36 Encounter for antenatal screening of mother: Secondary | ICD-10-CM | POA: Diagnosis not present

## 2014-12-15 DIAGNOSIS — Z3490 Encounter for supervision of normal pregnancy, unspecified, unspecified trimester: Secondary | ICD-10-CM | POA: Insufficient documentation

## 2014-12-18 ENCOUNTER — Ambulatory Visit (INDEPENDENT_AMBULATORY_CARE_PROVIDER_SITE_OTHER): Payer: Managed Care, Other (non HMO) | Admitting: Advanced Practice Midwife

## 2014-12-18 VITALS — BP 110/69 | HR 80 | Wt 156.0 lb

## 2014-12-18 DIAGNOSIS — Z3481 Encounter for supervision of other normal pregnancy, first trimester: Secondary | ICD-10-CM

## 2014-12-18 DIAGNOSIS — Z3491 Encounter for supervision of normal pregnancy, unspecified, first trimester: Secondary | ICD-10-CM

## 2014-12-18 DIAGNOSIS — Z0372 Encounter for suspected placental problem ruled out: Secondary | ICD-10-CM

## 2014-12-18 NOTE — Progress Notes (Signed)
US shows LVEIF, but genetic screening normal. There was concern for marginal previa on US, but final decision was that there was not a previa per Dr. Sherrie Georgeecker.  No F/U needed.

## 2014-12-18 NOTE — Progress Notes (Signed)
Low lying placenta on U/S

## 2014-12-18 NOTE — Patient Instructions (Addendum)
Bonnie White, [facility]  Second Trimester of Pregnancy The second trimester is from week 13 through week 28, months 4 through 6. The second trimester is often a time when you feel your best. Your body has also adjusted to being pregnant, and you begin to feel better physically. Usually, morning sickness has lessened or quit completely, you may have more energy, and you may have an increase in appetite. The second trimester is also a time when the fetus is growing rapidly. At the end of the sixth month, the fetus is about 9 inches long and weighs about 1 pounds. You will likely begin to feel the baby move (quickening) between 18 and 20 weeks of the pregnancy. BODY CHANGES Your body goes through many changes during pregnancy. The changes vary from woman to woman.   Your weight will continue to increase. You will notice your lower abdomen bulging out.  You may begin to get stretch marks on your hips, abdomen, and breasts.  You may develop headaches that can be relieved by medicines approved by your health care provider.  You may urinate more often because the fetus is pressing on your bladder.  You may develop or continue to have heartburn as a result of your pregnancy.  You may develop constipation because certain hormones are causing the muscles that push waste through your intestines to slow down.  You may develop hemorrhoids or swollen, bulging veins (varicose veins).  You may have back pain because of the weight gain and pregnancy hormones relaxing your joints between the bones in your pelvis and as a result of a shift in weight and the muscles that support your balance.  Your breasts will continue to grow and be tender.  Your gums may bleed and may be sensitive to brushing and flossing.  Dark spots or blotches (chloasma, mask of pregnancy) may develop on your face. This will likely fade after the baby is born.  A dark line from your belly button to the pubic area  (linea nigra) may appear. This will likely fade after the baby is born.  You may have changes in your hair. These can include thickening of your hair, rapid growth, and changes in texture. Some women also have hair loss during or after pregnancy, or hair that feels dry or thin. Your hair will most likely return to normal after your baby is born. WHAT TO EXPECT AT YOUR PRENATAL VISITS During a routine prenatal visit:  You will be weighed to make sure you and the fetus are growing normally.  Your blood pressure will be taken.  Your abdomen will be measured to track your baby's growth.  The fetal heartbeat will be listened to.  Any test results from the previous visit will be discussed. Your health care provider may ask you:  How you are feeling.  If you are feeling the baby move.  If you have had any abnormal symptoms, such as leaking fluid, bleeding, severe headaches, or abdominal cramping.  If you have any questions. Other tests that may be performed during your second trimester include:  Blood tests that check for:  Low iron levels (anemia).  Gestational diabetes (between 24 and 28 weeks).  Rh antibodies.  Urine tests to check for infections, diabetes, or protein in the urine.  An ultrasound to confirm the proper growth and development of the baby.  An amniocentesis to check for possible genetic problems.  Fetal screens for spina bifida and Down syndrome. HOME CARE INSTRUCTIONS  Avoid all smoking, herbs, alcohol, and unprescribed drugs. These chemicals affect the formation and growth of the baby.  Follow your health care provider's instructions regarding medicine use. There are medicines that are either safe or unsafe to take during pregnancy.  Exercise only as directed by your health care provider. Experiencing uterine cramps is a good sign to stop exercising.  Continue to eat regular, healthy meals.  Wear a good support bra for breast tenderness.  Do not use  hot tubs, steam rooms, or saunas.  Wear your seat belt at all times when driving.  Avoid raw meat, uncooked cheese, cat litter boxes, and soil used by cats. These carry germs that can cause birth defects in the baby.  Take your prenatal vitamins.  Try taking a stool softener (if your health care provider approves) if you develop constipation. Eat more high-fiber foods, such as fresh vegetables or fruit and whole grains. Drink plenty of fluids to keep your urine clear or pale yellow.  Take warm sitz baths to soothe any pain or discomfort caused by hemorrhoids. Use hemorrhoid cream if your health care provider approves.  If you develop varicose veins, wear support hose. Elevate your feet for 15 minutes, 3-4 times a day. Limit salt in your diet.  Avoid heavy lifting, wear low heel shoes, and practice good posture.  Rest with your legs elevated if you have leg cramps or low back pain.  Visit your dentist if you have not gone yet during your pregnancy. Use a soft toothbrush to brush your teeth and be gentle when you floss.  A sexual relationship may be continued unless your health care provider directs you otherwise.  Continue to go to all your prenatal visits as directed by your health care provider. SEEK MEDICAL CARE IF:   You have dizziness.  You have mild pelvic cramps, pelvic pressure, or nagging pain in the abdominal area.  You have persistent nausea, vomiting, or diarrhea.  You have a bad smelling vaginal discharge.  You have pain with urination. SEEK IMMEDIATE MEDICAL CARE IF:   You have a fever.  You are leaking fluid from your vagina.  You have spotting or bleeding from your vagina.  You have severe abdominal cramping or pain.  You have rapid weight gain or loss.  You have shortness of breath with chest pain.  You notice sudden or extreme swelling of your face, hands, ankles, feet, or legs.  You have not felt your baby move in over an hour.  You have severe  headaches that do not go away with medicine.  You have vision changes. Document Released: 07/15/2001 Document Revised: 07/26/2013 Document Reviewed: 09/21/2012 [facility] Patient Information 2015 [place], [place]. This information is not intended to replace advice given to you by your health care provider. Make sure you discuss any questions you have with your health care provider.  Breastfeeding Deciding to breastfeed is one of the best choices you can make for you and your baby. A change in hormones during pregnancy causes your breast tissue to grow and increases the number and size of your milk ducts. These hormones also allow proteins, sugars, and fats from your blood supply to make breast milk in your milk-producing glands. Hormones prevent breast milk from being released before your baby is born as well as prompt milk flow after birth. Once breastfeeding has begun, thoughts of your baby, as well as his or her sucking or crying, can stimulate the release of milk from your milk-producing glands.  BENEFITS OF  BREASTFEEDING For Your Baby  Your first milk (colostrum) helps your baby's digestive system function better.   There are antibodies in your milk that help your baby fight off infections.   Your baby has a lower incidence of asthma, allergies, and sudden infant death syndrome.   The nutrients in breast milk are better for your baby than infant formulas and are designed uniquely for your baby's needs.   Breast milk improves your baby's brain development.   Your baby is less likely to develop other conditions, such as childhood obesity, asthma, or type 2 diabetes mellitus.  For You   Breastfeeding helps to create a very special bond between you and your baby.   Breastfeeding is convenient. Breast milk is always available at the correct temperature and costs nothing.   Breastfeeding helps to burn calories and helps you lose the weight gained during pregnancy.   Breastfeeding  makes your uterus contract to its prepregnancy size faster and slows bleeding (lochia) after you give birth.   Breastfeeding helps to lower your risk of developing type 2 diabetes mellitus, osteoporosis, and breast or ovarian cancer later in life. SIGNS THAT YOUR BABY IS HUNGRY Early Signs of Hunger  Increased alertness or activity.  Stretching.  Movement of the head from side to side.  Movement of the head and opening of the mouth when the corner of the mouth or cheek is stroked (rooting).  Increased sucking sounds, smacking lips, cooing, sighing, or squeaking.  Hand-to-mouth movements.  Increased sucking of fingers or hands. Late Signs of Hunger  Fussing.  Intermittent crying. Extreme Signs of Hunger Signs of extreme hunger will require calming and consoling before your baby will be able to breastfeed successfully. Do not wait for the following signs of extreme hunger to occur before you initiate breastfeeding:   Restlessness.  A loud, strong cry.   Screaming. BREASTFEEDING BASICS Breastfeeding Initiation  Find a comfortable place to sit or lie down, with your neck and back well supported.  Place a pillow or rolled up blanket under your baby to bring him or her to the level of your breast (if you are seated). Nursing pillows are specially designed to help support your arms and your baby while you breastfeed.  Make sure that your baby's abdomen is facing your abdomen.   Gently massage your breast. With your fingertips, massage from your chest wall toward your nipple in a circular motion. This encourages milk flow. You may need to continue this action during the feeding if your milk flows slowly.  Support your breast with 4 fingers underneath and your thumb above your nipple. Make sure your fingers are well away from your nipple and your baby's mouth.   Stroke your baby's lips gently with your finger or nipple.   When your baby's mouth is open wide enough, quickly  bring your baby to your breast, placing your entire nipple and as much of the colored area around your nipple (areola) as possible into your baby's mouth.   More areola should be visible above your baby's upper lip than below the lower lip.   Your baby's tongue should be between his or her lower gum and your breast.   Ensure that your baby's mouth is correctly positioned around your nipple (latched). Your baby's lips should create a seal on your breast and be turned out (everted).  It is common for your baby to suck about 2-3 minutes in order to start the flow of breast milk. Latching Teaching your baby  how to latch on to your breast properly is very important. An improper latch can cause nipple pain and decreased milk supply for you and poor weight gain in your baby. Also, if your baby is not latched onto your nipple properly, he or she may swallow some air during feeding. This can make your baby fussy. Burping your baby when you switch breasts during the feeding can help to get rid of the air. However, teaching your baby to latch on properly is still the best way to prevent fussiness from swallowing air while breastfeeding. Signs that your baby has successfully latched on to your nipple:    Silent tugging or silent sucking, without causing you pain.   Swallowing heard between every 3-4 sucks.    Muscle movement above and in front of his or her ears while sucking.  Signs that your baby has not successfully latched on to nipple:   Sucking sounds or smacking sounds from your baby while breastfeeding.  Nipple pain. If you think your baby has not latched on correctly, slip your finger into the corner of your baby's mouth to break the suction and place it between your baby's gums. Attempt breastfeeding initiation again. Signs of Successful Breastfeeding Signs from your baby:   A gradual decrease in the number of sucks or complete cessation of sucking.   Falling asleep.    Relaxation of his or her body.   Retention of a small amount of milk in his or her mouth.   Letting go of your breast by himself or herself. Signs from you:  Breasts that have increased in firmness, weight, and size 1-3 hours after feeding.   Breasts that are softer immediately after breastfeeding.  Increased milk volume, as well as a change in milk consistency and color by the fifth day of breastfeeding.   Nipples that are not sore, cracked, or bleeding. Signs That Your Pecola Leisure is Getting Enough Milk  Wetting at least 3 diapers in a 24-hour period. The urine should be clear and pale yellow by age 29 days.  At least 3 stools in a 24-hour period by age 29 days. The stool should be soft and yellow.  At least 3 stools in a 24-hour period by age 287 days. The stool should be seedy and yellow.  No loss of weight greater than 10% of birth weight during the first 52 days of age.  Average weight gain of 4-7 ounces (113-198 g) per week after age 24 days.  Consistent daily weight gain by age 29 days, without weight loss after the age of 2 weeks. After a feeding, your baby may spit up a small amount. This is common. BREASTFEEDING FREQUENCY AND DURATION Frequent feeding will help you make more milk and can prevent sore nipples and breast engorgement. Breastfeed when you feel the need to reduce the fullness of your breasts or when your baby shows signs of hunger. This is called "breastfeeding on demand." Avoid introducing a pacifier to your baby while you are working to establish breastfeeding (the first 4-6 weeks after your baby is born). After this time you may choose to use a pacifier. Research has shown that pacifier use during the first year of a baby's life decreases the risk of sudden infant death syndrome (SIDS). Allow your baby to feed on each breast as long as he or she wants. Breastfeed until your baby is finished feeding. When your baby unlatches or falls asleep while feeding from the  first breast, offer the second breast.  Because newborns are often sleepy in the first few weeks of life, you may need to awaken your baby to get him or her to feed. Breastfeeding times will vary from baby to baby. However, the following rules can serve as a guide to help you ensure that your baby is properly fed:  Newborns (babies 47 weeks of age or younger) may breastfeed every 1-3 hours.  Newborns should not go longer than 3 hours during the day or 5 hours during the night without breastfeeding.  You should breastfeed your baby a minimum of 8 times in a 24-hour period until you begin to introduce solid foods to your baby at around 32 months of age. BREAST MILK PUMPING Pumping and storing breast milk allows you to ensure that your baby is exclusively fed your breast milk, even at times when you are unable to breastfeed. This is especially important if you are going back to work while you are still breastfeeding or when you are not able to be present during feedings. Your lactation consultant can give you guidelines on how long it is safe to store breast milk.  A breast pump is a machine that allows you to pump milk from your breast into a sterile bottle. The pumped breast milk can then be stored in a refrigerator or freezer. Some breast pumps are operated by hand, while others use electricity. Ask your lactation consultant which type will work best for you. Breast pumps can be purchased, but some hospitals and breastfeeding support groups lease breast pumps on a monthly basis. A lactation consultant can teach you how to hand express breast milk, if you prefer not to use a pump.  CARING FOR YOUR BREASTS WHILE YOU BREASTFEED Nipples can become dry, cracked, and sore while breastfeeding. The following recommendations can help keep your breasts moisturized and healthy:  Avoid using soap on your nipples.   Wear a supportive bra. Although not required, special nursing bras and tank tops are designed to  allow access to your breasts for breastfeeding without taking off your entire bra or top. Avoid wearing underwire-style bras or extremely tight bras.  Air dry your nipples for 3-18minutes after each feeding.   Use only cotton bra pads to absorb leaked breast milk. Leaking of breast milk between feedings is normal.   Use lanolin on your nipples after breastfeeding. Lanolin helps to maintain your skin's normal moisture barrier. If you use pure lanolin, you do not need to wash it off before feeding your baby again. Pure lanolin is not toxic to your baby. You may also hand express a few drops of breast milk and gently massage that milk into your nipples and allow the milk to air dry. In the first few weeks after giving birth, some women experience extremely full breasts (engorgement). Engorgement can make your breasts feel heavy, warm, and tender to the touch. Engorgement peaks within 3-5 days after you give birth. The following recommendations can help ease engorgement:  Completely empty your breasts while breastfeeding or pumping. You may want to start by applying warm, moist heat (in the shower or with warm water-soaked hand towels) just before feeding or pumping. This increases circulation and helps the milk flow. If your baby does not completely empty your breasts while breastfeeding, pump any extra milk after he or she is finished.  Wear a snug bra (nursing or regular) or tank top for 1-2 days to signal your body to slightly decrease milk production.  Apply ice packs to your breasts, unless  this is too uncomfortable for you.  Make sure that your baby is latched on and positioned properly while breastfeeding. If engorgement persists after 48 hours of following these recommendations, contact your health care provider or a Advertising copywriter. OVERALL HEALTH CARE RECOMMENDATIONS WHILE BREASTFEEDING  Eat healthy foods. Alternate between meals and snacks, eating 3 of each per day. Because what you  eat affects your breast milk, some of the foods may make your baby more irritable than usual. Avoid eating these foods if you are sure that they are negatively affecting your baby.  Drink milk, fruit juice, and water to satisfy your thirst (about 10 glasses a day).   Rest often, relax, and continue to take your prenatal vitamins to prevent fatigue, stress, and anemia.  Continue breast self-awareness checks.  Avoid chewing and smoking tobacco.  Avoid alcohol and drug use. Some medicines that may be harmful to your baby can pass through breast milk. It is important to ask your health care provider before taking any medicine, including all over-the-counter and prescription medicine as well as vitamin and herbal supplements. It is possible to become pregnant while breastfeeding. If birth control is desired, ask your health care provider about options that will be safe for your baby. SEEK MEDICAL CARE IF:   You feel like you want to stop breastfeeding or have become frustrated with breastfeeding.  You have painful breasts or nipples.  Your nipples are cracked or bleeding.  Your breasts are red, tender, or warm.  You have a swollen area on either breast.  You have a fever or chills.  You have nausea or vomiting.  You have drainage other than breast milk from your nipples.  Your breasts do not become full before feedings by the fifth day after you give birth.  You feel sad and depressed.  Your baby is too sleepy to eat well.  Your baby is having trouble sleeping.   Your baby is wetting less than 3 diapers in a 24-hour period.  Your baby has less than 3 stools in a 24-hour period.  Your baby's skin or the white part of his or her eyes becomes yellow.   Your baby is not gaining weight by 54 days of age. SEEK IMMEDIATE MEDICAL CARE IF:   Your baby is overly tired (lethargic) and does not want to wake up and feed.  Your baby develops an unexplained fever. Document Released:  07/21/2005 Document Revised: 07/26/2013 Document Reviewed: 01/12/2013 [facility] Patient Information 2015 [place], [place]. This information is not intended to replace advice given to you by your health care provider. Make sure you discuss any questions you have with your health care provider.

## 2015-01-15 ENCOUNTER — Ambulatory Visit (INDEPENDENT_AMBULATORY_CARE_PROVIDER_SITE_OTHER): Admitting: Advanced Practice Midwife

## 2015-01-15 VITALS — Wt 161.0 lb

## 2015-01-15 DIAGNOSIS — Z3491 Encounter for supervision of normal pregnancy, unspecified, first trimester: Secondary | ICD-10-CM

## 2015-01-15 DIAGNOSIS — Z3481 Encounter for supervision of other normal pregnancy, first trimester: Secondary | ICD-10-CM

## 2015-01-15 MED ORDER — CYCLOBENZAPRINE HCL 10 MG PO TABS: 5.0000 mg | ORAL_TABLET | Freq: Three times a day (TID) | ORAL | Status: DC | PRN

## 2015-01-15 NOTE — Addendum Note (Signed)
Addended by: Dorathy Kinsman on: 01/15/2015 09:47 AM   Modules accepted: Orders

## 2015-01-15 NOTE — Progress Notes (Signed)
Pt states she had a lot of abd tightening, pain, pressure last night.

## 2015-01-15 NOTE — Progress Notes (Signed)
Anatomy scan shows EIF, normal first trimester screen normal. No F/U Needed.

## 2015-01-15 NOTE — Patient Instructions (Signed)

## 2015-02-07 ENCOUNTER — Encounter (HOSPITAL_COMMUNITY): Payer: Self-pay | Admitting: *Deleted

## 2015-02-07 ENCOUNTER — Inpatient Hospital Stay (HOSPITAL_COMMUNITY)
Admission: AD | Admit: 2015-02-07 | Discharge: 2015-02-08 | DRG: 778 | Disposition: A | Discharge status: Home / Self Care | Source: Ambulatory Visit | Attending: Obstetrics & Gynecology | Admitting: Obstetrics & Gynecology

## 2015-02-07 DIAGNOSIS — Z87891 Personal history of nicotine dependence: Secondary | ICD-10-CM

## 2015-02-07 DIAGNOSIS — Z3491 Encounter for supervision of normal pregnancy, unspecified, first trimester: Secondary | ICD-10-CM

## 2015-02-07 DIAGNOSIS — R1032 Left lower quadrant pain: Secondary | ICD-10-CM | POA: Diagnosis present

## 2015-02-07 DIAGNOSIS — Z3A26 26 weeks gestation of pregnancy: Secondary | ICD-10-CM | POA: Diagnosis present

## 2015-02-07 LAB — TYPE AND SCREEN
ABO/RH(D): O POS
Antibody Screen: NEGATIVE

## 2015-02-07 LAB — CBC
HCT: 32.9 % — ABNORMAL LOW (ref 36.0–46.0)
HEMOGLOBIN: 11.4 g/dL — AB (ref 12.0–15.0)
MCH: 32.1 pg (ref 26.0–34.0)
MCHC: 34.7 g/dL (ref 30.0–36.0)
MCV: 92.7 fL (ref 78.0–100.0)
Platelets: 233 10*3/uL (ref 150–400)
RBC: 3.55 MIL/uL — AB (ref 3.87–5.11)
RDW: 12.6 % (ref 11.5–15.5)
WBC: 15.9 10*3/uL — AB (ref 4.0–10.5)

## 2015-02-07 LAB — FETAL FIBRONECTIN: Fetal Fibronectin: POSITIVE — AB

## 2015-02-07 LAB — ABO/RH: ABO/RH(D): O POS

## 2015-02-07 MED ORDER — PRENATAL MULTIVITAMIN CH
1.0000 | ORAL_TABLET | Freq: Every day | ORAL | Status: DC
  Administered 2015-02-07 – 2015-02-08 (×2): 1 via ORAL
  Filled 2015-02-07 (×4): qty 1

## 2015-02-07 MED ORDER — ACETAMINOPHEN 325 MG PO TABS: 650.0000 mg | ORAL_TABLET | ORAL | Status: DC | PRN

## 2015-02-07 MED ORDER — MAGNESIUM SULFATE 50 % IJ SOLN
2.0000 g/h | INTRAVENOUS | Status: AC
  Administered 2015-02-07: 2 g/h via INTRAVENOUS
  Filled 2015-02-07: qty 80

## 2015-02-07 MED ORDER — CALCIUM CARBONATE ANTACID 500 MG PO CHEW
2.0000 | CHEWABLE_TABLET | ORAL | Status: DC | PRN
  Filled 2015-02-07: qty 2

## 2015-02-07 MED ORDER — NIFEDIPINE 10 MG PO CAPS
10.0000 mg | ORAL_CAPSULE | Freq: Once | ORAL | Status: AC
  Administered 2015-02-07: 10 mg via ORAL
  Filled 2015-02-07: qty 1

## 2015-02-07 MED ORDER — MAGNESIUM SULFATE BOLUS VIA INFUSION
4.0000 g | Freq: Once | INTRAVENOUS | Status: AC
  Administered 2015-02-07: 4 g via INTRAVENOUS
  Filled 2015-02-07: qty 500

## 2015-02-07 MED ORDER — DOCUSATE SODIUM 100 MG PO CAPS
100.0000 mg | ORAL_CAPSULE | Freq: Every day | ORAL | Status: DC
  Administered 2015-02-07 – 2015-02-08 (×2): 100 mg via ORAL
  Filled 2015-02-07 (×4): qty 1

## 2015-02-07 MED ORDER — ZOLPIDEM TARTRATE 5 MG PO TABS
5.0000 mg | ORAL_TABLET | Freq: Every evening | ORAL | Status: DC | PRN
  Administered 2015-02-07: 5 mg via ORAL
  Filled 2015-02-07: qty 1

## 2015-02-07 MED ORDER — LACTATED RINGERS IV SOLN
INTRAVENOUS | Status: DC
  Administered 2015-02-07 – 2015-02-08 (×2): via INTRAVENOUS

## 2015-02-07 MED ORDER — BETAMETHASONE SOD PHOS & ACET 6 (3-3) MG/ML IJ SUSP
12.0000 mg | INTRAMUSCULAR | Status: AC
  Administered 2015-02-07 – 2015-02-08 (×2): 12 mg via INTRAMUSCULAR
  Filled 2015-02-07 (×2): qty 2

## 2015-02-07 NOTE — H&P (Signed)
History     CSN: [id number]  Arrival date and time: 02/07/15 0336   First Provider Initiated Contact with Patient 02/07/15 0413      No chief complaint on file.  Abdominal Pain This is a new problem. The current episode started today. The onset quality is gradual. The problem occurs intermittently. The problem has been gradually improving. The pain is located in the suprapubic region, RLQ and LLQ (More on LLQ). The pain is mild. The quality of the pain is aching and cramping. The abdominal pain does not radiate. Pertinent negatives include no constipation, diarrhea, dysuria, fever, frequency, headaches, hematuria, nausea or vomiting. Nothing aggravates the pain. The pain is relieved by bowel movements and urination. She has tried nothing for the symptoms. The treatment provided no relief.   This is a 29 y.o. female at [redacted]w[redacted]d who presents from [facility] ED with c/o lower abdominal pain and contractions since 8-9pm this evening. States they got worse, then got better after BM. Pain is crampy, dull. Comes and goes.  Went to ED and was sent here for evaluation. Denies leaking or bleeding. Has never had any preterm labor before.  Gets care at our [place] office  OB History    Gravida Para Term Preterm AB TAB SAB Ectopic Multiple Living   2 1 1       1      History reviewed. No pertinent past medical history.  Past Surgical History  Procedure Laterality Date  . Wisdom tooth extraction      Family History  Problem Relation Age of Onset  . Ulcerative colitis Father   . Seizures Sister     History  Substance Use Topics  . Smoking status: Former Smoker -- 8.00 packs/day for 1 years    Types: Cigarettes  . Smokeless tobacco: Never Used  . Alcohol Use: No    Allergies: No Known Allergies  Prescriptions prior to admission  Medication Sig Dispense Refill Last Dose  . cyclobenzaprine (FLEXERIL) 10 MG tablet Take 0.5-1 tablets (5-10 mg total) by mouth 3 (three) times daily as needed  for muscle spasms. 20 tablet 0 02/06/2015 at Unknown time  . Prenatal Multivit-Min-Fe-FA (PRENATAL VITAMINS PO) Take by mouth daily.   02/06/2015 at Unknown time   Medical, Surgical, Family and Social histories reviewed and are listed above.  Medications and allergies reviewed.   Review of Systems  Constitutional: Negative for fever, chills and malaise/fatigue.  Gastrointestinal: Positive for abdominal pain. Negative for nausea, vomiting, diarrhea and constipation.  Genitourinary: Negative for dysuria, urgency, frequency, hematuria and flank pain.  Musculoskeletal: Negative for back pain and neck pain.  Neurological: Negative for dizziness, weakness and headaches.  Other systems negative  Physical Exam   Blood pressure 112/57, pulse 71, temperature 98.3 F (36.8 C), temperature source Oral, resp. rate 15, height 5' 3" (1.6 m), weight 165 lb (74.844 kg), last menstrual period 07/28/2014, SpO2 100 %.  Physical Exam  Constitutional: She is oriented to person, place, and time. She appears well-developed and well-nourished. No distress.  HENT:  Head: Normocephalic.  Neck: Normal range of motion.  Cardiovascular: Normal rate and regular rhythm.   Respiratory: Effort normal. No respiratory distress.  GI: Soft. She exhibits no distension and no mass. There is no tenderness. There is no rebound and no guarding.  Genitourinary: Vagina normal. No vaginal discharge found.  Dilation: Fingertip Effacement (%): 40 Exam by:: Dexton Zwilling,CNM   Musculoskeletal: Normal range of motion.  Neurological: She is alert and oriented to person, place,   and time.  Skin: Skin is warm and dry.  Psychiatric: She has a normal mood and affect.    MAU Course  Procedures  MDM Given one Procardia which did lessen severity of UCs but did not stop them Second dose given FFn came back Positive Consulted Dr Stinson Admit for steroids and MgSO4 neuroprophylaxis  Assessment and Plan  A:  SIUP at [redacted]w[redacted]d        Preterm  labor with + FFn        P:  Admit to antenatal      Routine orders      Magnesium Sulfate for neuroprophylaxis      Betamethasone series  Jp Eastham 02/07/2015, 4:20 AM  

## 2015-02-07 NOTE — MAU Provider Note (Signed)
History     CSN: [id number]  Arrival date and time: 02/07/15 4098   First Provider Initiated Contact with Patient 02/07/15 0413      No chief complaint on file.  Abdominal Pain This is a new problem. The current episode started today. The onset quality is gradual. The problem occurs intermittently. The problem has been gradually improving. The pain is located in the suprapubic region, RLQ and LLQ (More on LLQ). The pain is mild. The quality of the pain is aching and cramping. The abdominal pain does not radiate. Pertinent negatives include no constipation, diarrhea, dysuria, fever, frequency, headaches, hematuria, nausea or vomiting. Nothing aggravates the pain. The pain is relieved by bowel movements and urination. She has tried nothing for the symptoms. The treatment provided no relief.   This is a 29 y.o. female at [redacted]w[redacted]d who presents from [facility] ED with c/o lower abdominal pain and contractions since 8-9pm this evening. States they got worse, then got better after BM. Pain is crampy, dull. Comes and goes.  Went to ED and was sent here for evaluation. Denies leaking or bleeding. Has never had any preterm labor before.  Gets care at our [place] office  OB History    Gravida Para Term Preterm AB TAB SAB Ectopic Multiple Living   History reviewed. No pertinent past medical history.  Past Surgical History  Procedure Laterality Date  . Wisdom tooth extraction      Family History  Problem Relation Age of Onset  . Ulcerative colitis Father   . Seizures Sister     History  Substance Use Topics  . Smoking status: Former Smoker -- 8.00 packs/day for 1 years    Types: Cigarettes  . Smokeless tobacco: Never Used  . Alcohol Use: No    Allergies: No Known Allergies  Prescriptions prior to admission  Medication Sig Dispense Refill Last Dose  . cyclobenzaprine (FLEXERIL) 10 MG tablet Take 0.5-1 tablets (5-10 mg total) by mouth 3 (three) times daily as needed  for muscle spasms. 20 tablet 0 02/06/2015 at Unknown time  . Prenatal Multivit-Min-Fe-FA (PRENATAL VITAMINS PO) Take by mouth daily.   02/06/2015 at Unknown time   Medical, Surgical, Family and Social histories reviewed and are listed above.  Medications and allergies reviewed.   Review of Systems  Constitutional: Negative for fever, chills and malaise/fatigue.  Gastrointestinal: Positive for abdominal pain. Negative for nausea, vomiting, diarrhea and constipation.  Genitourinary: Negative for dysuria, urgency, frequency, hematuria and flank pain.  Musculoskeletal: Negative for back pain and neck pain.  Neurological: Negative for dizziness, weakness and headaches.  Other systems negative  Physical Exam   Blood pressure 112/57, pulse 71, temperature 98.3 F (36.8 C), temperature source Oral, resp. rate 15, height  (1.6 m), weight 165 lb (74.844 kg), last menstrual period 07/28/2014, SpO2 100 %.  Physical Exam  Constitutional: She is oriented to person, place, and time. She appears well-developed and well-nourished. No distress.  HENT:  Head: Normocephalic.  Neck: Normal range of motion.  Cardiovascular: Normal rate and regular rhythm.   Respiratory: Effort normal. No respiratory distress.  GI: Soft. She exhibits no distension and no mass. There is no tenderness. There is no rebound and no guarding.  Genitourinary: Vagina normal. No vaginal discharge found.  Dilation: Fingertip Effacement (%): 40 Exam by:: Aadi Bordner,CNM   Musculoskeletal: Normal range of motion.  Neurological: She is alert and oriented to person, place,  and time.  Skin: Skin is warm and dry.  Psychiatric: She has a normal mood and affect.    MAU Course  Procedures  MDM Given one Procardia which did lessen severity of UCs but did not stop them Second dose given FFn came back Positive Consulted Dr Adrian BlackwaterStinson Admit for steroids and MgSO4 neuroprophylaxis  Assessment and Plan  A:  SIUP at 6561w5d        Preterm  labor with + FFn        P:  Admit to antenatal      Routine orders      Magnesium Sulfate for neuroprophylaxis      Betamethasone series  [facility]Carrah Eppolito 02/07/2015, 4:20 AM

## 2015-02-07 NOTE — MAU Note (Signed)
[facility]

## 2015-02-08 MED ORDER — NIFEDIPINE ER OSMOTIC RELEASE 30 MG PO TB24: 30.0000 mg | ORAL_TABLET | Freq: Two times a day (BID) | ORAL | Status: DC | PRN

## 2015-02-08 NOTE — Discharge Summary (Signed)
Antenatal Physician Discharge Summary  Patient ID: Bonnie White MRN: [id number] DOB/AGE: [birth year] 29 y.o.  Admit date: 02/07/2015 Discharge date: 02/08/2015  Admission Diagnoses: Principal Problem:   Preterm labor in second trimester without delivery   Discharge Diagnoses: same  Prenatal Procedures: NST  Consults: Neonatology, Maternal Fetal Medicine  Significant Diagnostic Studies:  Results for orders placed or performed during the hospital encounter of 02/07/15 (from the past 168 hour(s))  Fetal fibronectin   Collection Time: 02/07/15  4:14 AM  Result Value Ref Range   Fetal Fibronectin POSITIVE (A) NEGATIVE  CBC   Collection Time: 02/07/15  6:15 AM  Result Value Ref Range   WBC 15.9 (H) 4.0 - 10.5 K/uL   RBC 3.55 (L) 3.87 - 5.11 MIL/uL   Hemoglobin 11.4 (L) 12.0 - 15.0 g/dL   HCT 40.932.9 (L) 81.136.0 - 91.446.0 %   MCV 92.7 78.0 - 100.0 fL   MCH 32.1 26.0 - 34.0 pg   MCHC 34.7 30.0 - 36.0 g/dL   RDW 78.212.6 95.611.5 - 21.315.5 %   Platelets 233 150 - 400 K/uL  Type and screen   Collection Time: 02/07/15  6:15 AM  Result Value Ref Range   ABO/RH(D) O POS    Antibody Screen NEG    Sample Expiration 02/10/2015   ABO/Rh   Collection Time: 02/07/15  6:15 AM  Result Value Ref Range   ABO/RH(D) O POS     Treatments: IV hydration, steroids: Betamethasone x 2 and Magnesium Sulfate for CP prophylaxis  Hospital Course:  This is a 29 y.o. G2P1001 with IUP at 3240w6d admitted for Preterm labor. She was admitted with contractions, noted to have a cervical exam of 0.5/40/high.  No leaking of fluid and no bleeding.  She was initially started on magnesium sulfate for tocolysis and neuroprotection and also received betamethasone x 2 doses.  Her tocolysis was transitioned to Procardia.   She was observed, fetal heart rate monitoring remained reassuring, and she had no signs/symptoms of progressing preterm labor or other maternal-fetal concerns.  Her cervical exam was unchanged from admission.  She was  deemed stable for discharge to home with outpatient follow up.  Discharge Exam: BP 115/79 mmHg  Pulse 93  Temp(Src) 97.9 F (36.6 C) (Oral)  Resp 18  Ht 5\' 3"  (1.6 m)  Wt 165 lb (74.844 kg)  BMI 29.24 kg/m2  SpO2 99%  LMP 07/28/2014 (Approximate) General appearance: alert, cooperative and appears stated age GI: gravid, NT Extremities: Homans sign is negative, no sign of DVT  Discharge Condition: Stable  Disposition: 01-Home or Self Care  Discharge Instructions    Discharge activity:  Bathroom / Shower only    Complete by:  As directed      Discharge activity: Bedrest    Complete by:  As directed      Discharge diet:  No restrictions    Complete by:  As directed      Do not have sex or do anything that might make you have an orgasm    Complete by:  As directed      Fetal Kick Count:  Lie on our left side for one hour after a meal, and count the number of times your baby kicks.  If it is less than 5 times, get up, move around and drink some juice.  Repeat the test 30 minutes later.  If it is still less than 5 kicks in an hour, notify your doctor.    Complete by:  As directed  Notify physician for a general feeling that "something is not right"    Complete by:  As directed      Notify physician for increase or change in vaginal discharge    Complete by:  As directed      Notify physician for intestinal cramps, with or without diarrhea, sometimes described as "gas pain"    Complete by:  As directed      Notify physician for leaking of fluid    Complete by:  As directed      Notify physician for low, dull backache, unrelieved by heat or Tylenol    Complete by:  As directed      Notify physician for menstrual like cramps    Complete by:  As directed      Notify physician for pelvic pressure    Complete by:  As directed      Notify physician for uterine contractions.  These may be painless and feel like the uterus is tightening or the baby is  "balling up"    Complete by:   As directed      Notify physician for vaginal bleeding    Complete by:  As directed      PRETERM LABOR:  Includes any of the follwing symptoms that occur between 20 - [redacted] weeks gestation.  If these symptoms are not stopped, preterm labor can result in preterm delivery, placing your baby at risk    Complete by:  As directed             Medication List    TAKE these medications        cyclobenzaprine 10 MG tablet  Commonly known as:  FLEXERIL  Take 0.5-1 tablets (5-10 mg total) by mouth 3 (three) times daily as needed for muscle spasms.     NIFEdipine 30 MG 24 hr tablet  Commonly known as:  PROCARDIA XL  Take 1 tablet (30 mg total) by mouth 2 (two) times daily as needed (contractions).     PRENATAL VITAMINS PO  Take by mouth daily.           Follow-up Information    Follow up with [facility] [place].   Specialty:  Obstetrics and Gynecology   Why:  keep next scheduled appointment   Contact information:   [address], [address] [place] [address] [phone]      Signed: Reva Bores M.D. 02/08/2015, 11:59 AM

## 2015-02-08 NOTE — Progress Notes (Signed)
Pr discharged to home with spouse.  Understands instructions.

## 2015-02-08 NOTE — Discharge Instructions (Signed)

## 2015-02-12 ENCOUNTER — Ambulatory Visit (INDEPENDENT_AMBULATORY_CARE_PROVIDER_SITE_OTHER): Admitting: Advanced Practice Midwife

## 2015-02-12 VITALS — BP 117/71 | HR 70 | Wt 165.0 lb

## 2015-02-12 DIAGNOSIS — Z23 Encounter for immunization: Secondary | ICD-10-CM

## 2015-02-12 DIAGNOSIS — Z3491 Encounter for supervision of normal pregnancy, unspecified, first trimester: Secondary | ICD-10-CM

## 2015-02-12 DIAGNOSIS — Z3490 Encounter for supervision of normal pregnancy, unspecified, unspecified trimester: Secondary | ICD-10-CM

## 2015-02-12 DIAGNOSIS — Z3483 Encounter for supervision of other normal pregnancy, third trimester: Secondary | ICD-10-CM

## 2015-02-12 LAB — CBC
HEMATOCRIT: 33.8 % — AB (ref 36.0–46.0)
Hemoglobin: 11.4 g/dL — ABNORMAL LOW (ref 12.0–15.0)
MCH: 32.2 pg (ref 26.0–34.0)
MCHC: 33.7 g/dL (ref 30.0–36.0)
MCV: 95.5 fL (ref 78.0–100.0)
MPV: 10.7 fL (ref 8.6–12.4)
Platelets: 255 10*3/uL (ref 150–400)
RBC: 3.54 MIL/uL — ABNORMAL LOW (ref 3.87–5.11)
RDW: 13 % (ref 11.5–15.5)
WBC: 9.5 10*3/uL (ref 4.0–10.5)

## 2015-02-12 MED ORDER — TETANUS-DIPHTH-ACELL PERTUSSIS 5-2.5-18.5 LF-MCG/0.5 IM SUSP
0.5000 mL | Freq: Once | INTRAMUSCULAR | Status: AC
  Administered 2015-02-12: 0.5 mL via INTRAMUSCULAR

## 2015-02-12 NOTE — Progress Notes (Signed)
28 week labs today. Hospitalized 7/6-7/7 for preterm contractions,pos fFN. BMZ x 2. TDaP.

## 2015-02-12 NOTE — Addendum Note (Signed)
Addended by: Anell BarrHOWARD, Clarann Helvey L on: 02/12/2015 10:18 AM   Modules accepted: Orders

## 2015-02-12 NOTE — Patient Instructions (Addendum)

## 2015-02-13 ENCOUNTER — Telehealth: Payer: Self-pay | Admitting: *Deleted

## 2015-02-13 LAB — RPR

## 2015-02-13 LAB — GLUCOSE TOLERANCE, 1 HOUR (50G) W/O FASTING: GLUCOSE 1 HOUR GTT: 151 mg/dL — AB (ref 70–140)

## 2015-02-13 LAB — HIV ANTIBODY (ROUTINE TESTING W REFLEX): HIV 1&2 Ab, 4th Generation: NONREACTIVE

## 2015-02-13 NOTE — Telephone Encounter (Signed)
Pt notified of abnormal 1 hr GTT and is scheduled for 3 hr GTT.

## 2015-02-14 ENCOUNTER — Other Ambulatory Visit

## 2015-02-14 DIAGNOSIS — R7309 Other abnormal glucose: Secondary | ICD-10-CM

## 2015-02-15 ENCOUNTER — Telehealth: Payer: Self-pay | Admitting: *Deleted

## 2015-02-15 LAB — GLUCOSE TOLERANCE, 3 HOURS
GLUCOSE, FASTING-GESTATIONAL: 77 mg/dL (ref 70–104)
Glucose Tolerance, 1 hour: 173 mg/dL (ref 70–189)
Glucose Tolerance, 2 hour: 158 mg/dL (ref 70–164)
Glucose, GTT - 3 Hour: 103 mg/dL (ref 70–144)

## 2015-02-15 NOTE — Telephone Encounter (Signed)
Pt notified of 3 hr GTT results 

## 2015-02-26 ENCOUNTER — Ambulatory Visit (INDEPENDENT_AMBULATORY_CARE_PROVIDER_SITE_OTHER): Admitting: Advanced Practice Midwife

## 2015-02-26 VITALS — BP 98/68 | HR 74 | Wt 169.0 lb

## 2015-02-26 DIAGNOSIS — Z3491 Encounter for supervision of normal pregnancy, unspecified, first trimester: Secondary | ICD-10-CM

## 2015-02-26 DIAGNOSIS — K5900 Constipation, unspecified: Secondary | ICD-10-CM

## 2015-02-26 DIAGNOSIS — O99613 Diseases of the digestive system complicating pregnancy, third trimester: Secondary | ICD-10-CM

## 2015-02-26 DIAGNOSIS — Z3483 Encounter for supervision of other normal pregnancy, third trimester: Secondary | ICD-10-CM

## 2015-02-26 MED ORDER — ZOLPIDEM TARTRATE 5 MG PO TABS: 5.0000 mg | ORAL_TABLET | Freq: Every evening | ORAL | Status: DC | PRN

## 2015-02-26 NOTE — Patient Instructions (Signed)
Basic Carbohydrate Counting for Diabetes Mellitus Carbohydrate counting is a method for keeping track of the amount of carbohydrates you eat. Eating carbohydrates naturally increases the level of sugar (glucose) in your blood, so it is important for you to know the amount that is okay for you to have in every meal. Carbohydrate counting helps keep the level of glucose in your blood within normal limits. The amount of carbohydrates allowed is different for every person. A dietitian can help you calculate the amount that is right for you. Once you know the amount of carbohydrates you can have, you can count the carbohydrates in the foods you want to eat. Carbohydrates are found in the following foods:  Grains, such as breads and cereals.  Dried beans and soy products.  Starchy vegetables, such as potatoes, peas, and corn.  Fruit and fruit juices.  Milk and yogurt.  Sweets and snack foods, such as cake, cookies, candy, chips, soft drinks, and fruit drinks. CARBOHYDRATE COUNTING There are two ways to count the carbohydrates in your food. You can use either of the methods or a combination of both. Reading the "Nutrition Facts" on Packaged Food The "Nutrition Facts" is an area that is included on the labels of almost all packaged food and beverages in the [place]. It includes the serving size of that food or beverage and information about the nutrients in each serving of the food, including the grams (g) of carbohydrate per serving.  Decide the number of servings of this food or beverage that you will be able to eat or drink. Multiply that number of servings by the number of grams of carbohydrate that is listed on the label for that serving. The total will be the amount of carbohydrates you will be having when you eat or drink this food or beverage. Learning Standard Serving Sizes of Food When you eat food that is not packaged or does not include "Nutrition Facts" on the label, you need to  measure the servings in order to count the amount of carbohydrates.A serving of most carbohydrate-rich foods contains about 15 g of carbohydrates. The following list includes serving sizes of carbohydrate-rich foods that provide 15 g ofcarbohydrate per serving:   1 slice of bread (1 oz) or 1 six-inch tortilla.    of a hamburger bun or English muffin.  4-6 crackers.   cup unsweetened dry cereal.    cup hot cereal.   cup rice or pasta.    cup mashed potatoes or  of a large baked potato.  1 cup fresh fruit or one small piece of fruit.    cup canned or frozen fruit or fruit juice.  1 cup milk.   cup plain fat-free yogurt or yogurt sweetened with artificial sweeteners.   cup cooked dried beans or starchy vegetable, such as peas, corn, or potatoes.  Decide the number of standard-size servings that you will eat. Multiply that number of servings by 15 (the grams of carbohydrates in that serving). For example, if you eat 2 cups of strawberries, you will have eaten 2 servings and 30 g of carbohydrates (2 servings x 15 g = 30 g). For foods such as soups and casseroles, in which more than one food is mixed in, you will need to count the carbohydrates in each food that is included. EXAMPLE OF CARBOHYDRATE COUNTING Sample Dinner  3 oz chicken breast.   cup of brown rice.   cup of corn.  1 cup milk.   1 cup strawberries with   sugar-free whipped topping.  Carbohydrate Calculation Step 1: Identify the foods that contain carbohydrates:   Rice.   Corn.   Milk.   Strawberries. Step 2:Calculate the number of servings eaten of each:   2 servings of rice.   1 serving of corn.   1 serving of milk.   1 serving of strawberries. Step 3: Multiply each of those number of servings by 15 g:   2 servings of rice x 15 g = 30 g.   1 serving of corn x 15 g = 15 g.   1 serving of milk x 15 g = 15 g.   1 serving of strawberries x 15 g = 15 g. Step 4: Add  together all of the amounts to find the total grams of carbohydrates eaten: 30 g + 15 g + 15 g + 15 g = 75 g. Document Released: 07/21/2005 Document Revised: 12/05/2013 Document Reviewed: 06/17/2013 ExitCare Patient Information 2015 [employer]. This information is not intended to replace advice given to you by your health care provider. Make sure you discuss any questions you have with your health care provider. Diabetes Mellitus and Food It is important for you to manage your blood sugar (glucose) level. Your blood glucose level can be greatly affected by what you eat. Eating healthier foods in the appropriate amounts throughout the day at about the same time each day will help you control your blood glucose level. It can also help slow or prevent worsening of your diabetes mellitus. Healthy eating may even help you improve the level of your blood pressure and reach or maintain a healthy weight.  HOW CAN FOOD AFFECT ME? Carbohydrates Carbohydrates affect your blood glucose level more than any other type of food. Your dietitian will help you determine how many carbohydrates to eat at each meal and teach you how to count carbohydrates. Counting carbohydrates is important to keep your blood glucose at a healthy level, especially if you are using insulin or taking certain medicines for diabetes mellitus. Alcohol Alcohol can cause sudden decreases in blood glucose (hypoglycemia), especially if you use insulin or take certain medicines for diabetes mellitus. Hypoglycemia can be a life-threatening condition. Symptoms of hypoglycemia (sleepiness, dizziness, and disorientation) are similar to symptoms of having too much alcohol.  If your health care provider has given you approval to drink alcohol, do so in moderation and use the following guidelines:  Women should not have more than one drink per day, and men should not have more than two drinks per day. One drink is equal to:  12 oz of beer.  5 oz of  wine.  1 oz of hard liquor.  Do not drink on an empty stomach.  Keep yourself hydrated. Have water, diet soda, or unsweetened iced tea.  Regular soda, juice, and other mixers might contain a lot of carbohydrates and should be counted. WHAT FOODS ARE NOT RECOMMENDED? As you make food choices, it is important to remember that all foods are not the same. Some foods have fewer nutrients per serving than other foods, even though they might have the same number of calories or carbohydrates. It is difficult to get your body what it needs when you eat foods with fewer nutrients. Examples of foods that you should avoid that are high in calories and carbohydrates but low in nutrients include:  Trans fats (most processed foods list trans fats on the Nutrition Facts label).  Regular soda.  Juice.  Candy.  Sweets, such as cake, pie,   doughnuts, and cookies.  Fried foods. WHAT FOODS CAN I EAT? Have nutrient-rich foods, which will nourish your body and keep you healthy. The food you should eat also will depend on several factors, including:  The calories you need.  The medicines you take.  Your weight.  Your blood glucose level.  Your blood pressure level.  Your cholesterol level. You also should eat a variety of foods, including:  Protein, such as meat, poultry, fish, tofu, nuts, and seeds (lean animal proteins are best).  Fruits.  Vegetables.  Dairy products, such as milk, cheese, and yogurt (low fat is best).  Breads, grains, pasta, cereal, rice, and beans.  Fats such as olive oil, trans fat-free margarine, canola oil, avocado, and olives. DOES EVERYONE WITH DIABETES MELLITUS HAVE THE SAME MEAL PLAN? Because every person with diabetes mellitus is different, there is not one meal plan that works for everyone. It is very important that you meet with a dietitian who will help you create a meal plan that is just right for you. Document Released: 04/17/2005 Document Revised:  07/26/2013 Document Reviewed: 06/17/2013 ExitCare Patient Information 2015 [employer]. This information is not intended to replace advice given to you by your health care provider. Make sure you discuss any questions you have with your health care provider.  

## 2015-02-26 NOTE — Progress Notes (Signed)
3 hour GTT 1 abn value. Rec diabetic diet. Constipation. Discussed diet, exercise, Miralax.

## 2015-03-06 ENCOUNTER — Other Ambulatory Visit: Payer: Self-pay | Admitting: *Deleted

## 2015-03-06 MED ORDER — NIFEDIPINE ER OSMOTIC RELEASE 30 MG PO TB24: 30.0000 mg | ORAL_TABLET | Freq: Two times a day (BID) | ORAL | Status: DC | PRN

## 2015-03-12 ENCOUNTER — Ambulatory Visit (INDEPENDENT_AMBULATORY_CARE_PROVIDER_SITE_OTHER): Admitting: Advanced Practice Midwife

## 2015-03-12 VITALS — BP 106/67 | HR 90 | Wt 168.0 lb

## 2015-03-12 DIAGNOSIS — Z3481 Encounter for supervision of other normal pregnancy, first trimester: Secondary | ICD-10-CM

## 2015-03-12 DIAGNOSIS — N949 Unspecified condition associated with female genital organs and menstrual cycle: Secondary | ICD-10-CM

## 2015-03-12 DIAGNOSIS — O26893 Other specified pregnancy related conditions, third trimester: Secondary | ICD-10-CM

## 2015-03-12 DIAGNOSIS — Z3491 Encounter for supervision of normal pregnancy, unspecified, first trimester: Secondary | ICD-10-CM

## 2015-03-12 NOTE — Progress Notes (Signed)
Increased pelvic pressure. Rare contractions. Less than when hospitalized for preterm contractions. Cervix long and closed.  PTL precautions.

## 2015-03-12 NOTE — Patient Instructions (Signed)
Braxton Hicks Contractions Contractions of the uterus can occur throughout pregnancy. Contractions are not always a sign that you are in labor.  WHAT ARE BRAXTON HICKS CONTRACTIONS?  Contractions that occur before labor are called Braxton Hicks contractions, or false labor. Toward the end of pregnancy (32-34 weeks), these contractions can develop more often and may become more forceful. This is not true labor because these contractions do not result in opening (dilatation) and thinning of the cervix. They are sometimes difficult to tell apart from true labor because these contractions can be forceful and people have different pain tolerances. You should not feel embarrassed if you go to the hospital with false labor. Sometimes, the only way to tell if you are in true labor is for your health care provider to look for changes in the cervix. If there are no prenatal problems or other health problems associated with the pregnancy, it is completely safe to be sent home with false labor and await the onset of true labor. HOW CAN YOU TELL THE DIFFERENCE BETWEEN TRUE AND FALSE LABOR? False Labor  The contractions of false labor are usually shorter and not as hard as those of true labor.   The contractions are usually irregular.   The contractions are often felt in the front of the lower abdomen and in the groin.   The contractions may go away when you walk around or change positions while lying down.   The contractions get weaker and are shorter lasting as time goes on.   The contractions do not usually become progressively stronger, regular, and closer together as with true labor.  True Labor  Contractions in true labor last 30-70 seconds, become very regular, usually become more intense, and increase in frequency.   The contractions do not go away with walking.   The discomfort is usually felt in the top of the uterus and spreads to the lower abdomen and low back.   True labor can be  determined by your health care provider with an exam. This will show that the cervix is dilating and getting thinner.  WHAT TO REMEMBER  Keep up with your usual exercises and follow other instructions given by your health care provider.   Take medicines as directed by your health care provider.   Keep your regular prenatal appointments.   Eat and drink lightly if you think you are going into labor.   If Braxton Hicks contractions are making you uncomfortable:   Change your position from lying down or resting to walking, or from walking to resting.   Sit and rest in a tub of warm water.   Drink 2-3 glasses of water. Dehydration may cause these contractions.   Do slow and deep breathing several times an hour.  WHEN SHOULD I SEEK IMMEDIATE MEDICAL CARE? Seek immediate medical care if:  Your contractions become stronger, more regular, and closer together.   You have fluid leaking or gushing from your vagina.   You have a fever.   You pass blood-tinged mucus.   You have vaginal bleeding.   You have continuous abdominal pain.   You have low back pain that you never had before.   You feel your baby's head pushing down and causing pelvic pressure.   Your baby is not moving as much as it used to.  Document Released: 07/21/2005 Document Revised: 07/26/2013 Document Reviewed: 05/02/2013 [employer] Patient Information 2015 [employer]. This information is not intended to replace advice given to you by your health care   provider. Make sure you discuss any questions you have with your health care provider.   Breastfeeding Challenges and Solutions Even though breastfeeding is natural, it can be challenging, especially in the first few weeks after childbirth. It is normal for problems to arise when starting to breastfeed your new baby, even if you have breastfed before. This document provides some solutions to the most common breastfeeding challenges.  CHALLENGES AND  SOLUTIONS Challenge--Cracked or Sore Nipples Cracked or sore nipples are commonly experienced by breastfeeding mothers. Cracked or sore nipples often are caused by inadequate latching (when your baby's mouth attaches to your breast to breastfeed). Soreness can also happen if your baby is not positioned properly at your breast. Although nipple cracking and soreness are common during the first week after birth, nipple pain is never normal. If you experience nipple cracking or soreness that lasts longer than 1 week or nipple pain, call your health care provider or lactation consultant.  Solution Ensure proper latching and positioning of your baby by following the steps below:  Find a comfortable place to sit or lie down, with your neck and back well supported.  Place a pillow or rolled up blanket under your baby to bring him or her to the level of your breast (if you are seated).  Make sure that your baby's abdomen is facing your abdomen.  Gently massage your breast. With your fingertips, massage from your chest wall toward your nipple in a circular motion. This encourages milk flow. You may need to continue this action during the feeding if your milk flows slowly.  Support your breast with 4 fingers underneath and your thumb above your nipple. Make sure your fingers are well away from your nipple and your baby's mouth.  Stroke your baby's lips gently with your finger or nipple.  When your baby's mouth is open wide enough, quickly bring your baby to your breast, placing your entire nipple and as much of the colored area around your nipple (areola) as possible into your baby's mouth.  More areola should be visible above your baby's upper lip than below the lower lip.  Your baby's tongue should be between his or her lower gum and your breast.  Ensure that your baby's mouth is correctly positioned around your nipple (latched). Your baby's lips should create a seal on your breast and be turned out  (everted).  It is common for your baby to suck for about 2-3 minutes in order to start the flow of breast milk. Signs that your baby has successfully latched on to your nipple include:   Quietly tugging or quietly sucking without causing you pain.   Swallowing heard between every 3-4 sucks.   Muscle movement above and in front of his or her ears with sucking.  Signs that your baby has not successfully latched on to nipple include:   Sucking sounds or smacking sounds from your baby while nursing.   Nipple pain.  Ensure that your breasts stay moisturized and healthy by:  Avoiding the use of soap on your nipples.   Wearing a supportive bra. Avoid wearing underwire-style bras or tight bras.   Air drying your nipples for 3-4 minutes after each feeding.   Using only cotton bra pads to absorb breast milk leakage. Leaking of breast milk between feedings is normal. Be sure to change the pads if they become soaked with milk.  Using lanolin on your nipples after nursing. Lanolin helps to maintain your skin's normal moisture barrier. If you use   pure lanolin you do not need to wash it off before feeding your baby again. Pure lanolin is not toxic to your baby. You may also hand express a few drops of breast milk and gently massage that milk into your nipples, allowing it to air dry. Challenge--Breast Engorgement Breast engorgement is the overfilling of your breasts with breast milk. In the first few weeks after giving birth, you may experience breast engorgement. Breast engorgement can make your breasts throb and feel hard, tightly stretched, warm, and tender. Engorgement peaks about the fifth day after you give birth. Having breast engorgement does not mean you have to stop breastfeeding your baby. Solution  Breastfeed when you feel the need to reduce the fullness of your breasts or when your baby shows signs of hunger. This is called "breastfeeding on demand."  Newborns (babies younger  than 4 weeks) often breastfeed every 1-3 hours during the day. You may need to awaken your baby to feed if he or she is asleep at a feeding time.  Do not allow your baby to sleep longer than 5 hours during the night without a feeding.  Pump or hand express breast milk before breastfeeding to soften your breast, areola, and nipple.  Apply warm, moist heat (in the shower or with warm water-soaked hand towels) just before feeding or pumping, or massage your breast before or during breastfeeding. This increases circulation and helps your milk to flow.  Completely empty your breasts when breastfeeding or pumping. Afterward, wear a snug bra (nursing or regular) or tank top for 1-2 days to signal your body to slightly decrease milk production. Only wear snug bras or tank tops to treat engorgement. Tight bras typically should be avoided by breastfeeding mothers. Once engorgement is relieved, return to wearing regular, loose-fitting clothes.  Apply ice packs to your breasts to lessen the pain from engorgement and relieve swelling, unless the ice is uncomfortable for you.  Do not delay feedings. Try to relax when it is time to feed your baby. This helps to trigger your "let-down reflex," which releases milk from your breast.  Ensure your baby is latched on to your breast and positioned properly while breastfeeding.  Allow your baby to remain at your breast as long as he or she is latched on well and actively sucking. Your baby will let you know when he or she is done breastfeeding by pulling away from your breast or falling asleep.  Avoid introducing bottles or pacifiers to your baby in the early weeks of breastfeeding. Wait to introduce these things until after resolving any breastfeeding challenges.  Try to pump your milk on the same schedule as when your baby would breastfeed if you are returning to work or away from home for an extended period.  Drink plenty of fluids to avoid dehydration, which can  eventually put you at greater risk of breast engorgement. If you follow these suggestions, your engorgement should improve in 24-48 hours. If you are still experiencing difficulty, call your lactation consultant or health care provider.  Challenge--Plugged Milk Ducts Plugged milk ducts occur when the duct does not drain milk effectively and becomes swollen. Wearing a tight-fitting nursing bra or having difficulty with latching may cause plugged milk ducts. Not drinking enough water (8-10 c [1.9-2.4 L] per day) can contribute to plugged milk ducts. Once a duct has become plugged, hard lumps, soreness, and redness may develop in your breast.  Solution Do not delay feedings. Feed your baby frequently and try to empty your   breasts of milk at each feeding. Try breastfeeding from the affected side first so there is a better chance that the milk will drain completely from that breast. Apply warm, moist towels to your breasts for 5-10 minutes before feeding. Alternatively, a hot shower right before breastfeeding can provide the moist heat that can encourage milk flow. Gentle massage of the sore area before and during a feeding may also help. Avoid wearing tight clothing or bras that put pressure on your breasts. Wear bras that offer good support to your breasts, but avoid underwire bras. If you have a plugged milk duct and develop a fever, you need to see your health care provider.  Challenge--Mastitis Mastitis is inflammation of your breast. It usually is caused by a bacterial infection and can cause flu-like symptoms. You may develop redness in your breast and a fever. Often when mastitis occurs, your breast becomes firm, warm, and very painful. The most common causes of mastitis are poor latching, ineffective sucking from your baby, consistent pressure on your breast (possibly from wearing a tight-fitting bra or shirt that restricts the milk flow), unusual stress or fatigue, or missed feedings.  Solution You  will be given antibiotic medicine to treat the infection. It is still important to breastfeed frequently to empty your breasts. Continuing to breastfeed while you recover from mastitis will not harm your baby. Make sure your baby is positioned properly during every feeding. Apply moist heat to your breasts for a few minutes before feeding to help the milk flow and to help your breasts empty more easily. Challenge--Thrush Thrush is a yeast infection that can form on your nipples, in your breast, or in your baby's mouth. It causes itching, soreness, burning or stabbing pain, and sometimes a rash.  Solution You will be given a medicated ointment for your nipples, and your baby will be given a liquid medicine for his or her mouth. It is important that you and your baby are treated at the same time because thrush can be passed between you and your baby. Change disposable nursing pads often. Any bras, towels, or clothing that come in contact with infected areas of your body or your baby's body need to be washed in very hot water every day. Wash your hands and your baby's hands often. All pacifiers, bottle nipples, or toys your baby puts in his or her mouth should be boiled once a day for 20 minutes. After 1 week of treatment, discard pacifiers and bottle nipples and buy new ones. All breast pump parts that touch the milk need to be boiled for 20 minutes every day. Challenge--Low Milk Supply You may not be producing enough milk if your baby is not gaining the proper amount of weight. Breast milk production is based on a supply-and-demand system. Your milk supply depends on how frequently and effectively your baby empties your breast. Solution The more you breastfeed and pump, the more breast milk you will produce. It is important that your baby empties at least one of your breasts at each feeding. If this is not happening, then use a breast pump or hand express any milk that remains. This will help to drain as  much milk as possible at each feeding. It will also signal your body to produce more milk. If your baby is not emptying your breasts, it may be due to latching, sucking, or positioning problems. If low milk supply continues after addressing these issues, contact your health care provider or a lactation specialist as soon   as possible. Challenge--Inverted or Flat Nipples Some women have nipples that turn inward instead of protruding outward. Other women have nipples that are flat. Inverted or flat nipples can sometimes make it more difficult for your baby to latch onto your breast. Solution You may be given a small device that pulls out inverted nipples. This device should be applied right before your baby is brought to your breast. You can also try using a breast pump for a short time before placing the baby at your breast. The pump can pull your nipple outwards to help your infant latch more easily. The baby's sucking motion will help the inverted nipple protrude as well.  If you have flat nipples, encourage your baby to latch onto your breast and feed frequently in the early days after birth. This will give your baby practice latching on correctly while your breast is still soft. When your milk supply increases, between the second and fifth day after birth and your breasts become full, your baby will have an easier time latching.  Contact a lactation consultant if you still have concerns. She or he can teach you additional techniques to address breastfeeding problems related to nipple shape and position.  FOR MORE INFORMATION [employer]: www.llli.org Document Released: 01/12/2006 Document Revised: 07/26/2013 Document Reviewed: 01/14/2013 [employer] Patient Information 2015 [employer]. This information is not intended to replace advice given to you by your health care provider. Make sure you discuss any questions you have with your health care provider.  

## 2015-03-26 ENCOUNTER — Ambulatory Visit (INDEPENDENT_AMBULATORY_CARE_PROVIDER_SITE_OTHER): Admitting: Advanced Practice Midwife

## 2015-03-26 VITALS — BP 103/67 | HR 112 | Wt 168.0 lb

## 2015-03-26 DIAGNOSIS — Z3483 Encounter for supervision of other normal pregnancy, third trimester: Secondary | ICD-10-CM

## 2015-03-26 DIAGNOSIS — O26843 Uterine size-date discrepancy, third trimester: Secondary | ICD-10-CM

## 2015-03-26 NOTE — Progress Notes (Signed)
Subjective:  Bonnie White is a 29 y.o. G2P1001 at [redacted]w[redacted]d being seen today for ongoing prenatal care.  Patient reports no complaints.  Contractions: Not present.  Vag. Bleeding: None. Movement: Present. Denies leaking of fluid.   The following portions of the patient's history were reviewed and updated as appropriate: allergies, current medications, past family history, past medical history, past social history, past surgical history and problem list.   Objective:   Filed Vitals:   03/26/15 0932  BP: 103/67  Pulse: 112  Weight: 168 lb (76.204 kg)    Fetal Status: Fetal Heart Rate (bpm): 143   Movement: Present     General:  Alert, oriented and cooperative. Patient is in no acute distress.  Skin: Skin is warm and dry. No rash noted.   Cardiovascular: Normal heart rate noted  Respiratory: Normal respiratory effort, no problems with respiration noted  Abdomen: Soft, gravid, appropriate for gestational age. Pain/Pressure: Absent     Pelvic: Vag. Bleeding: None Vag D/C Character: Thin   Cervical exam deferred        Extremities: Normal range of motion.  Edema: Trace  Mental Status: Normal mood and affect. Normal behavior. Normal judgment and thought content.   Urinalysis: Urine Protein: Trace Urine Glucose: Negative  Assessment and Plan:  Pregnancy: G2P1001 at [redacted]w[redacted]d  1. Uterine size date discrepancy pregnancy, third trimester  - US OB Follow Up; Future  Preterm labor symptoms and general obstetric precautions including but not limited to vaginal bleeding, contractions, leaking of fluid and fetal movement were reviewed in detail with the patient. Please refer to After Visit Summary for other counseling recommendations.  No Follow-up on file.   Hurshel Party, CNM

## 2015-04-05 ENCOUNTER — Other Ambulatory Visit: Payer: Self-pay | Admitting: Advanced Practice Midwife

## 2015-04-05 ENCOUNTER — Ambulatory Visit (HOSPITAL_COMMUNITY)
Admission: RE | Admit: 2015-04-05 | Discharge: 2015-04-05 | Disposition: A | Discharge status: Home / Self Care | Source: Ambulatory Visit | Attending: Advanced Practice Midwife | Admitting: Advanced Practice Midwife

## 2015-04-05 DIAGNOSIS — O26843 Uterine size-date discrepancy, third trimester: Secondary | ICD-10-CM | POA: Diagnosis present

## 2015-04-05 DIAGNOSIS — Z3A34 34 weeks gestation of pregnancy: Secondary | ICD-10-CM

## 2015-04-13 ENCOUNTER — Other Ambulatory Visit: Payer: Self-pay | Admitting: Family

## 2015-04-13 ENCOUNTER — Ambulatory Visit (INDEPENDENT_AMBULATORY_CARE_PROVIDER_SITE_OTHER): Admitting: Family

## 2015-04-13 VITALS — BP 105/71 | HR 100 | Wt 171.0 lb

## 2015-04-13 DIAGNOSIS — Z36 Encounter for antenatal screening of mother: Secondary | ICD-10-CM | POA: Diagnosis not present

## 2015-04-13 DIAGNOSIS — O4700 False labor before 37 completed weeks of gestation, unspecified trimester: Secondary | ICD-10-CM

## 2015-04-13 DIAGNOSIS — Z3483 Encounter for supervision of other normal pregnancy, third trimester: Secondary | ICD-10-CM

## 2015-04-13 DIAGNOSIS — Z3493 Encounter for supervision of normal pregnancy, unspecified, third trimester: Secondary | ICD-10-CM

## 2015-04-13 LAB — OB RESULTS CONSOLE GBS: GBS: POSITIVE

## 2015-04-13 NOTE — Progress Notes (Signed)
Subjective:  Bonnie White is a 29 y.o. G2P1001 at [redacted]w[redacted]d being seen today for ongoing prenatal care.  Patient reports occasional contractions.  Contractions: Irritability.  Vag. Bleeding: None. Movement: Present. Denies leaking of fluid. Closing on house today!!!  The following portions of the patient's history were reviewed and updated as appropriate: allergies, current medications, past family history, past medical history, past social history, past surgical history and problem list.   Objective:   Filed Vitals:   04/13/15 0848  BP: 105/71  Pulse: 100  Weight: 171 lb (77.565 kg)    Fetal Status: Fetal Heart Rate (bpm): 141   Movement: Present     General:  Alert, oriented and cooperative. Patient is in no acute distress.  Skin: Skin is warm and dry. No rash noted.   Cardiovascular: Normal heart rate noted  Respiratory: Normal respiratory effort, no problems with respiration noted  Abdomen: Soft, gravid, appropriate for gestational age. Pain/Pressure: Absent     Pelvic: Vag. Bleeding: None Vag D/C Character: Thin   Cervical exam performed      1/thick  Extremities: Normal range of motion.  Edema: Trace  Mental Status: Normal mood and affect. Normal behavior. Normal judgment and thought content.   Urinalysis: Urine Protein: Trace Urine Glucose: Negative  Assessment and Plan:  Pregnancy: G2P1001 at [redacted]w[redacted]d  1. Normal pregnancy, third trimester - Culture, beta strep (group b only) - GC/Chlamydia Probe Amp  Preterm labor symptoms and general obstetric precautions including but not limited to vaginal bleeding, contractions, leaking of fluid and fetal movement were reviewed in detail with the patient. Please refer to After Visit Summary for other counseling recommendations.  Return in about 1 week (around 04/20/2015).   Eino Farber Kennith Gain, CNM

## 2015-04-14 LAB — CULTURE, BETA STREP (GROUP B ONLY)

## 2015-04-16 LAB — GC/CHLAMYDIA PROBE AMP
CT Probe RNA: NEGATIVE
GC Probe RNA: NEGATIVE

## 2015-04-20 ENCOUNTER — Ambulatory Visit (INDEPENDENT_AMBULATORY_CARE_PROVIDER_SITE_OTHER): Admitting: Advanced Practice Midwife

## 2015-04-20 VITALS — BP 116/72 | HR 76 | Wt 176.0 lb

## 2015-04-20 DIAGNOSIS — Z3481 Encounter for supervision of other normal pregnancy, first trimester: Secondary | ICD-10-CM

## 2015-04-20 DIAGNOSIS — O403XX1 Polyhydramnios, third trimester, fetus 1: Secondary | ICD-10-CM

## 2015-04-20 DIAGNOSIS — Z3491 Encounter for supervision of normal pregnancy, unspecified, first trimester: Secondary | ICD-10-CM

## 2015-04-20 DIAGNOSIS — O99820 Streptococcus B carrier state complicating pregnancy: Secondary | ICD-10-CM

## 2015-04-23 DIAGNOSIS — O403XX0 Polyhydramnios, third trimester, not applicable or unspecified: Secondary | ICD-10-CM | POA: Insufficient documentation

## 2015-04-23 DIAGNOSIS — O99820 Streptococcus B carrier state complicating pregnancy: Secondary | ICD-10-CM | POA: Insufficient documentation

## 2015-04-23 NOTE — Patient Instructions (Signed)
Group B Streptococcus Infection During Pregnancy Group B streptococcus (GBS) is a type of bacteria often found in healthy women. GBS is not the same as the bacteria that causes strep throat. You may have GBS in your vagina, rectum, or bladder. GBS does not spread through sexual contact, but it can be passed to a baby during childbirth. This can be dangerous for your baby. It is not dangerous to you and usually does not cause any symptoms. Your health care provider may test you for GBS when your pregnancy is between 35 and 37 weeks. GBS is dangerous only during birth, so there is no need to test for it earlier. It is possible to have GBS during pregnancy and never pass it to your baby. If your test results are positive for GBS, your health care provider may recommend giving you antibiotic medicine during delivery to make sure your baby stays healthy. RISK FACTORS You are more likely to pass GBS to your baby if:   Your water breaks (ruptured membrane) or you go into labor before 37 weeks.  Your water breaks 18 hours before you deliver.  You passed GBS during a previous pregnancy.  You have a urinary tract infection caused by GBS any time during pregnancy.  You have a fever during labor. SYMPTOMS Most women who have GBS do not have any symptoms. If you have a urinary tract infection caused by GBS, you might have frequent or painful urination and fever. Babies who get GBS usually show symptoms within 7 days of birth. Symptoms may include:   Breathing problems.  Heart and blood pressure problems.  Digestive and kidney problems. DIAGNOSIS Routine screening for GBS is recommended for all pregnant women. A health care provider takes a sample of the fluid in your vagina and rectum with a swab. It is then sent to a lab to be checked for GBS. A sample of your urine may also be checked for the bacteria.  TREATMENT If you test positive for GBS, you may need treatment with an antibiotic medicine during  labor. As soon as you go into labor, or as soon as your membranes rupture, you will get the antibiotic medicine through an IV access. You will continue to get the medicine until after you give birth. You do not need antibiotic medicine if you are having a cesarean delivery.If your baby shows signs or symptoms of GBS after birth, your baby can also be treated with an antibiotic medicine. HOME CARE INSTRUCTIONS   Take all antibiotic medicine as prescribed by your health care provider. Only take medicine as directed.   Continue with prenatal visits and care.   Keep all follow-up appointments.  SEEK MEDICAL CARE IF:   You have pain when you urinate.   You have to urinate frequently.   You have a fever.  SEEK IMMEDIATE MEDICAL CARE IF:   Your membranes rupture.  You go into labor. Document Released: 10/28/2007 Document Revised: 07/26/2013 Document Reviewed: 05/13/2013 [employer] Patient Information 2015 [employer]. This information is not intended to replace advice given to you by your health care provider. Make sure you discuss any questions you have with your health care provider.  

## 2015-04-23 NOTE — Progress Notes (Signed)
Subjective:  Bonnie White is a 29 y.o. G2P1001 at [redacted]w[redacted]d being seen today for ongoing prenatal care.  Patient reports no complaints.  Contractions: Irregular.  Vag. Bleeding: None. Movement: Present. Denies leaking of fluid.   The following portions of the patient's history were reviewed and updated as appropriate: allergies, current medications, past family history, past medical history, past social history, past surgical history and problem list.   Objective:   Filed Vitals:   04/20/15 1140  BP: 116/72  Pulse: 76  Weight: 176 lb (79.833 kg)    Fetal Status: Fetal Heart Rate (bpm): 138   Movement: Present     General:  Alert, oriented and cooperative. Patient is in no acute distress.  Skin: Skin is warm and dry. No rash noted.   Cardiovascular: Normal heart rate noted  Respiratory: Normal respiratory effort, no problems with respiration noted  Abdomen: Soft, gravid, appropriate for gestational age. Pain/Pressure: Absent     Pelvic: Vag. Bleeding: None Vag D/C Character: Thin   Cervical exam deferred        Extremities: Normal range of motion.  Edema: Mild pitting, slight indentation  Mental Status: Normal mood and affect. Normal behavior. Normal judgment and thought content.   Urinalysis: Urine Protein: Negative Urine Glucose: Negative  Assessment and Plan:  Pregnancy: G2P1001 at [redacted]w[redacted]d 1. Polyhydramnios, third trimester, antepartum condition or complication, fetus 1   2. Group B Streptococcus carrier, antepartum   3. Supervision of normal pregnancy in first trimester   Term labor symptoms and general obstetric precautions including but not limited to vaginal bleeding, contractions, leaking of fluid and fetal movement were reviewed in detail with the patient. Please refer to After Visit Summary for other counseling recommendations.  F/U 1 week. PCN in labor  [place], CNM   Addendum: Reviewed [place] from 9/1. No report. Mild poly present. Will call pt and start  antenatal testing.

## 2015-04-24 ENCOUNTER — Inpatient Hospital Stay (HOSPITAL_COMMUNITY)
Admission: AD | Admit: 2015-04-24 | Discharge: 2015-04-24 | Disposition: A | Discharge status: Home / Self Care | Source: Ambulatory Visit | Attending: Obstetrics & Gynecology | Admitting: Obstetrics & Gynecology

## 2015-04-24 ENCOUNTER — Ambulatory Visit (INDEPENDENT_AMBULATORY_CARE_PROVIDER_SITE_OTHER): Admitting: *Deleted

## 2015-04-24 ENCOUNTER — Inpatient Hospital Stay (HOSPITAL_COMMUNITY)

## 2015-04-24 ENCOUNTER — Telehealth: Payer: Self-pay | Admitting: Advanced Practice Midwife

## 2015-04-24 VITALS — BP 119/66 | HR 88 | Wt 172.0 lb

## 2015-04-24 DIAGNOSIS — O368390 Maternal care for abnormalities of the fetal heart rate or rhythm, unspecified trimester, not applicable or unspecified: Secondary | ICD-10-CM

## 2015-04-24 DIAGNOSIS — O403XX1 Polyhydramnios, third trimester, fetus 1: Secondary | ICD-10-CM

## 2015-04-24 DIAGNOSIS — O99820 Streptococcus B carrier state complicating pregnancy: Secondary | ICD-10-CM

## 2015-04-24 DIAGNOSIS — Z3A37 37 weeks gestation of pregnancy: Secondary | ICD-10-CM

## 2015-04-24 DIAGNOSIS — Z3493 Encounter for supervision of normal pregnancy, unspecified, third trimester: Secondary | ICD-10-CM | POA: Diagnosis present

## 2015-04-24 DIAGNOSIS — O4700 False labor before 37 completed weeks of gestation, unspecified trimester: Secondary | ICD-10-CM

## 2015-04-24 DIAGNOSIS — Z3491 Encounter for supervision of normal pregnancy, unspecified, first trimester: Secondary | ICD-10-CM

## 2015-04-24 MED ORDER — ZOLPIDEM TARTRATE 5 MG PO TABS: 5.0000 mg | ORAL_TABLET | Freq: Every day | ORAL | Status: DC

## 2015-04-24 MED ORDER — CYCLOBENZAPRINE HCL 10 MG PO TABS: 5.0000 mg | ORAL_TABLET | Freq: Three times a day (TID) | ORAL | Status: DC | PRN

## 2015-04-24 NOTE — OB Triage Note (Signed)
Per MD pt BPP normal and pt is ready for discharge with follow up appointment 04/27/15.

## 2015-04-24 NOTE — Discharge Instructions (Signed)

## 2015-04-24 NOTE — Progress Notes (Signed)
Pt seen in office for AFI, NST for polyhydramnios. AFI 21cm (normal). NST with either prolonged accels or tachycardia and variable decel x 1. I reviewed tracing remotely. Pt sent to MAU for BPP. Consulted w/ Dr. Erin Fulling, agrees.

## 2015-04-24 NOTE — Telephone Encounter (Signed)
Called to discuss Dx Polyhydramnios, need to start twice weekly testing.

## 2015-04-24 NOTE — Progress Notes (Signed)
Patient ID: Bonnie White, female   DOB: Jun 28, 1986, 29 y.o.   MRN: [id number] Chief Complaint:  for monitoring    First Provider Initiated Contact with Patient 04/24/2015 at 1920.    HPI: Bonnie White is a 29 y.o. G2P1001 at [redacted]w[redacted]d who was sent to maternity admissions after NST in the office (performed for polyhydramnios which is now resolved) showed possible fetal tachycardia versus prolonged accelerations in the office.   Denies fever, chills, abdominal pain, contractions, leakage of fluid or vaginal bleeding. Good fetal movement.   Pregnancy Course: Elevated 1 hour GTT with normal 3 hour gtt. Mild Polyhydramnios diagnosed 04/05/2015. High-normal AFI on ultrasound today.   Past Medical History: No past medical history on file.  Past obstetric history: OB History  Gravida Para Term Preterm AB SAB TAB Ectopic Multiple Living  # Outcome Date GA Lbr Len/2nd Weight Sex Delivery Anes PTL Lv  2 Current           1 Term 06/26/08    F Vag-Spont  Y Y      Past Surgical History: Past Surgical History  Procedure Laterality Date  . Wisdom tooth extraction       Family History: Family History  Problem Relation Age of Onset  . Ulcerative colitis Father   . Seizures Sister     Social History: Social History  Substance Use Topics  . Smoking status: Former Smoker -- 8.00 packs/day for 1 years    Types: Cigarettes  . Smokeless tobacco: Never Used  . Alcohol Use: No    Allergies: No Known Allergies  Meds:  Prescriptions prior to admission  Medication Sig Dispense Refill Last Dose  . cyclobenzaprine (FLEXERIL) 10 MG tablet Take 0.5-1 tablets (5-10 mg total) by mouth 3 (three) times daily as needed for muscle spasms. 20 tablet 0 Taking  . NIFEdipine (PROCARDIA XL) 30 MG 24 hr tablet Take 1 tablet (30 mg total) by mouth 2 (two) times daily as needed (contractions). 60 tablet 3 Taking  . Prenatal Multivit-Min-Fe-FA (PRENATAL VITAMINS PO) Take by mouth daily.    Taking  . zolpidem (AMBIEN) 5 MG tablet Take 1 tablet (5 mg total) by mouth at bedtime as needed for sleep. 30 tablet 1 Taking    I have reviewed patient's Past Medical Hx, Surgical Hx, Family Hx, Social Hx, medications and allergies.   ROS:  Review of Systems  Constitutional: Negative for fever and chills.  HENT: Negative for congestion and sore throat.   Respiratory: Negative for cough.   Gastrointestinal: Negative for abdominal pain.  Genitourinary: Negative for dysuria, urgency, frequency, hematuria, flank pain and vaginal bleeding.       Negative for leaking of fluid.    Physical Exam  Patient Vitals for the past 24 hrs:  BP Pulse  04/24/15 1818 126/81 mmHg (!) 108   Constitutional: Well-developed, well-nourished female in no acute distress.  Cardiovascular: Mild tachycardia Respiratory: normal effort GI: Abd soft, non-tender, gravid , size greater than dates.  MS: Extremities nontender, no edema, normal ROM Neurologic: Alert and oriented x 4.  GU: Neg CVAT.   FHT:  Baseline 135 , moderate variability, accelerations present, 2 questionable variables versus tracing maternal heart rate and 3 mild decelerations. Contractions: Rare, painless   Labs: No results found for this or any previous visit (from the past 24 hour(s)).  Imaging:  BPP 8/8  MAU Course: Few variableS seen on prolonged monitoring. BPP  ordered.  MDM: 29 year old pregnant female at 37.4 weeks with BPP performed for few variables on NST performed for polyhydramnios. BPP 8/8 and monitoring 1 hour after variables showed no further variables. Fetal status overall reassuring.   Assessment: 1. Polyhydramnios, third trimester, antepartum condition or complication, fetus 1   2. Variable fetal heart rate decelerations, antepartum    Plan: Discharge home in stable condition.  Labor precautions and fetal kick counts   Follow-up Information    Follow up with [facility] On  04/27/2015.   Specialty:  Obstetrics and Gynecology   Why:  Routine prenatal visit   Contact information:   [address], [address] [place] [address] [phone]      Follow up with [facility] [place] [facility].   Why:  As needed in emergencies   Contact information:   [address] [id number] mc [place] [address] [phone]         Medication List    STOP taking these medications        NIFEdipine 30 MG 24 hr tablet  Commonly known as:  PROCARDIA XL      TAKE these medications        acetaminophen 325 MG tablet  Commonly known as:  TYLENOL  Take 650 mg by mouth every 6 (six) hours as needed for moderate pain.     calcium carbonate 500 MG chewable tablet  Commonly known as:  TUMS - dosed in mg elemental calcium  Chew 2 tablets by mouth as needed for indigestion or heartburn.     cyclobenzaprine 10 MG tablet  Commonly known as:  FLEXERIL  Take 0.5 tablets (5 mg total) by mouth 3 (three) times daily as needed for muscle spasms. Muscle spasms     PRENATAL VITAMINS PO  Take 1 tablet by mouth daily.     zolpidem 5 MG tablet  Commonly known as:  AMBIEN  Take 1 tablet (5 mg total) by mouth at bedtime.        [place], [place] 04/24/2015 7:16 PM

## 2015-04-27 ENCOUNTER — Ambulatory Visit (INDEPENDENT_AMBULATORY_CARE_PROVIDER_SITE_OTHER): Admitting: Obstetrics and Gynecology

## 2015-04-27 ENCOUNTER — Encounter: Payer: Self-pay | Admitting: Obstetrics and Gynecology

## 2015-04-27 VITALS — BP 111/80 | HR 92 | Wt 172.0 lb

## 2015-04-27 DIAGNOSIS — Z3483 Encounter for supervision of other normal pregnancy, third trimester: Secondary | ICD-10-CM

## 2015-04-27 DIAGNOSIS — O403XX1 Polyhydramnios, third trimester, fetus 1: Secondary | ICD-10-CM

## 2015-04-27 DIAGNOSIS — Z3493 Encounter for supervision of normal pregnancy, unspecified, third trimester: Secondary | ICD-10-CM

## 2015-04-27 NOTE — Patient Instructions (Signed)
Vaginal Delivery During delivery, your health care Joscelynn Brutus will help you give birth to your baby. During a vaginal delivery, you will work to push the baby out of your vagina. However, before you can push your baby out, a few things need to happen. The opening of your uterus (cervix) has to soften, thin out, and open up (dilate) all the way to 10 cm. Also, your baby has to move down from the uterus into your vagina.  SIGNS OF LABOR  Your health care Davionna Blacksher will first need to make sure you are in labor. Signs of labor include:   Passing what is called the mucous plug before labor begins. This is a small amount of blood-stained mucus.  Having regular, painful uterine contractions.   The time between contractions gets shorter.   The discomfort and pain gradually get more intense.  Contraction pains get worse when walking and do not go away when resting.   Your cervix becomes thinner (effacement) and dilates. BEFORE THE DELIVERY Once you are in labor and admitted into the hospital or care center, your health care Brayant Dorr may do the following:   Perform a complete physical exam.  Review any complications related to pregnancy or labor.  Check your blood pressure, pulse, temperature, and heart rate (vital signs).   Determine if, and when, the rupture of amniotic membranes occurred.  Do a vaginal exam (using a sterile glove and lubricant) to determine:   The position (presentation) of the baby. Is the baby's head presenting first (vertex) in the birth canal (vagina), or are the feet or buttocks first (breech)?   The level (station) of the baby's head within the birth canal.   The effacement and dilatation of the cervix.   An electronic fetal monitor is usually placed on your abdomen when you first arrive. This is used to monitor your contractions and the baby's heart rate.  When the monitor is on your abdomen (external fetal monitor), it can only pick up the frequency and  length of your contractions. It cannot tell the strength of your contractions.  If it becomes necessary for your health care Ferrah Panagopoulos to know exactly how strong your contractions are or to see exactly what the baby's heart rate is doing, an internal monitor may be inserted into your vagina and uterus. Your health care Lyndzee Kliebert will discuss the benefits and risks of using an internal monitor and obtain your permission before inserting the device.  Continuous fetal monitoring may be needed if you have an epidural, are receiving certain medicines (such as oxytocin), or have pregnancy or labor complications.  An IV access tube may be placed into a vein in your arm to deliver fluids and medicines if necessary. THREE STAGES OF LABOR AND DELIVERY Normal labor and delivery is divided into three stages. First Stage This stage starts when you begin to contract regularly and your cervix begins to efface and dilate. It ends when your cervix is completely open (fully dilated). The first stage is the longest stage of labor and can last from 3 hours to 15 hours.  Several methods are available to help with labor pain. You and your health care Taleeya Blondin will decide which option is best for you. Options include:   Opioid medicines. These are strong pain medicines that you can get through your IV tube or as a shot into your muscle. These medicines lessen pain but do not make it go away completely.  Epidural. A medicine is given through a thin tube that   is inserted in your back. The medicine numbs the lower part of your body and prevents any pain in that area.  Paracervical pain medicine. This is an injection of an anesthetic on each side of your cervix.   You may request natural childbirth, which does not involve the use of pain medicines or an epidural during labor and delivery. Instead, you will use other things, such as breathing exercises, to help cope with the pain. Second Stage The second stage of labor  begins when your cervix is fully dilated at 10 cm. It continues until you push your baby down through the birth canal and the baby is born. This stage can take only minutes or several hours.  The location of your baby's head as it moves through the birth canal is reported as a number called a station. If the baby's head has not started its descent, the station is described as being at minus 3 (-3). When your baby's head is at the zero station, it is at the middle of the birth canal and is engaged in the pelvis. The station of your baby helps indicate the progress of the second stage of labor.  When your baby is born, your health care Martinez Boxx may hold the baby with his or her head lowered to prevent amniotic fluid, mucus, and blood from getting into the baby's lungs. The baby's mouth and nose may be suctioned with a small bulb syringe to remove any additional fluid.  Your health care Raymona Boss may then place the baby on your stomach. It is important to keep the baby from getting cold. To do this, the health care Byrd Terrero will dry the baby off, place the baby directly on your skin (with no blankets between you and the baby), and cover the baby with warm, dry blankets.   The umbilical cord is cut. Third Stage During the third stage of labor, your health care Sanda Dejoy will deliver the placenta (afterbirth) and make sure your bleeding is under control. The delivery of the placenta usually takes about 5 minutes but can take up to 30 minutes. After the placenta is delivered, a medicine may be given either by IV or injection to help contract the uterus and control bleeding. If you are planning to breastfeed, you can try to do so now. After you deliver the placenta, your uterus should contract and get very firm. If your uterus does not remain firm, your health care Loda Bialas will massage it. This is important because the contraction of the uterus helps cut off bleeding at the site where the placenta was attached  to your uterus. If your uterus does not contract properly and stay firm, you may continue to bleed heavily. If there is a lot of bleeding, medicines may be given to contract the uterus and stop the bleeding.  Document Released: 04/29/2008 Document Revised: 12/05/2013 Document Reviewed: 01/09/2013 Exit[employer] Patient Information 2015 [employer]. This information is not intended to replace advice given to you by your health care Mieko Kneebone. Make sure you discuss any questions you have with your health care Ary Rudnick.  

## 2015-04-27 NOTE — Progress Notes (Signed)
Subjective:  Bonnie White is a 29 y.o. G2P1001 at [redacted]w[redacted]d being seen today for ongoing prenatal care.  Patient reports no complaints.  Contractions: Irregular.  Vag. Bleeding: None. Movement: Present. Denies leaking of fluid. More intense and frequent UCs. No mucus d/c.   The following portions of the patient's history were reviewed and updated as appropriate: allergies, current medications, past family history, past medical history, past social history, past surgical history and problem list. AFI normalized.  Objective:   Filed Vitals:   04/27/15 1041  BP: 111/80  Pulse: 92  Weight: 172 lb (78.019 kg)    Fetal Status: Fetal Heart Rate (bpm): NST   Movement: Present     General:  Alert, oriented and cooperative. Patient is in no acute distress.  Skin: Skin is warm and dry. No rash noted.   Cardiovascular: Normal heart rate noted  Respiratory: Normal respiratory effort, no problems with respiration noted  Abdomen: Soft, gravid, appropriate for gestational age. Pain/Pressure: Present     Pelvic: Vag. Bleeding: None Vag D/C Character: Thin   Cervical exam performed      1/L/H  Extremities: Normal range of motion.  Edema: Mild pitting, slight indentation  Mental Status: Normal mood and affect. Normal behavior. Normal judgment and thought content.   Urinalysis: Urine Protein: Negative Urine Glucose: Negative  Assessment and Plan:  Pregnancy: G2P1001 at [redacted]w[redacted]d  Normal pregnancy, third trimester Will d/c fetal testing.  Term labor symptoms and general obstetric precautions including but not limited to vaginal bleeding, contractions, leaking of fluid and fetal movement were reviewed in detail with the patient. Please refer to After Visit Summary for other counseling recommendations.  Return in about 1 week (around 05/04/2015).   Danae Orleans, CNM

## 2015-04-27 NOTE — Progress Notes (Signed)
Pt here for OB and NST - Wants cervical check

## 2015-05-04 ENCOUNTER — Ambulatory Visit (INDEPENDENT_AMBULATORY_CARE_PROVIDER_SITE_OTHER): Admitting: Family

## 2015-05-04 VITALS — BP 125/78 | HR 84 | Wt 172.0 lb

## 2015-05-04 DIAGNOSIS — Z3483 Encounter for supervision of other normal pregnancy, third trimester: Secondary | ICD-10-CM

## 2015-05-04 DIAGNOSIS — Z3493 Encounter for supervision of normal pregnancy, unspecified, third trimester: Secondary | ICD-10-CM

## 2015-05-04 NOTE — Progress Notes (Signed)
Subjective:  Bonnie White is a 29 y.o. G2P1001 [redacted]w[redacted]d being seen today for ongoing prenatal care.  Patient reports occasional contractions.  Contractions: Irregular.  Vag. Bleeding: None. Movement: Present. Denies leaking of fluid.   The following portions of the patient's history were reviewed and updated as appropriate: allergies, current medications, past family history, past medical history, past social history, past surgical history and problem list.   Objective:   Filed Vitals:   05/04/15 0829  BP: 125/78  Pulse: 84  Weight: 172 lb (78.019 kg)    Fetal Status: Fetal Heart Rate (bpm): 134   Movement: Present     General:  Alert, oriented and cooperative. Patient is in no acute distress.  Skin: Skin is warm and dry. No rash noted.   Cardiovascular: Normal heart rate noted  Respiratory: Normal respiratory effort, no problems with respiration noted  Abdomen: Soft, gravid, appropriate for gestational age. Pain/Pressure: Present     Pelvic: Vag. Bleeding: None Vag D/C Character: Thin   Cervical exam performed      1/thick/ballatoble  Extremities: Normal range of motion.  Edema: Trace  Mental Status: Normal mood and affect. Normal behavior. Normal judgment and thought content.   Urinalysis: Urine Protein: Negative Urine Glucose: Negative  Assessment and Plan:  Pregnancy: G2P1001 at [redacted]w[redacted]d  Term labor symptoms and general obstetric precautions including but not limited to vaginal bleeding, contractions, leaking of fluid and fetal movement were reviewed in detail with the patient. Please refer to After Visit Summary for other counseling recommendations.  Return in about 1 week (around 05/11/2015).   Eino Farber Kennith Gain, CNM

## 2015-05-11 ENCOUNTER — Ambulatory Visit (INDEPENDENT_AMBULATORY_CARE_PROVIDER_SITE_OTHER): Admitting: Advanced Practice Midwife

## 2015-05-11 VITALS — BP 120/82 | HR 78 | Wt 173.0 lb

## 2015-05-11 DIAGNOSIS — O368131 Decreased fetal movements, third trimester, fetus 1: Secondary | ICD-10-CM

## 2015-05-11 DIAGNOSIS — Z3483 Encounter for supervision of other normal pregnancy, third trimester: Secondary | ICD-10-CM

## 2015-05-11 DIAGNOSIS — Z3493 Encounter for supervision of normal pregnancy, unspecified, third trimester: Secondary | ICD-10-CM

## 2015-05-11 NOTE — Patient Instructions (Signed)
Vaginal Delivery °During delivery, your health care provider will help you give birth to your baby. During a vaginal delivery, you will work to push the baby out of your vagina. However, before you can push your baby out, a few things need to happen. The opening of your uterus (cervix) has to soften, thin out, and open up (dilate) all the way to 10 cm. Also, your baby has to move down from the uterus into your vagina.  °SIGNS OF LABOR  °Your health care provider will first need to make sure you are in labor. Signs of labor include:  °· Passing what is called the mucous plug before labor begins. This is a small amount of blood-stained mucus. °· Having regular, painful uterine contractions.   °· The time between contractions gets shorter.   °· The discomfort and pain gradually get more intense. °· Contraction pains get worse when walking and do not go away when resting.   °· Your cervix becomes thinner (effacement) and dilates. °BEFORE THE DELIVERY °Once you are in labor and admitted into the hospital or care center, your health care provider may do the following:  °· Perform a complete physical exam. °· Review any complications related to pregnancy or labor.  °· Check your blood pressure, pulse, temperature, and heart rate (vital signs).   °· Determine if, and when, the rupture of amniotic membranes occurred. °· Do a vaginal exam (using a sterile glove and lubricant) to determine:   °¨ The position (presentation) of the baby. Is the baby's head presenting first (vertex) in the birth canal (vagina), or are the feet or buttocks first (breech)?   °¨ The level (station) of the baby's head within the birth canal.   °¨ The effacement and dilatation of the cervix.   °· An electronic fetal monitor is usually placed on your abdomen when you first arrive. This is used to monitor your contractions and the baby's heart rate. °¨ When the monitor is on your abdomen (external fetal monitor), it can only pick up the frequency and  length of your contractions. It cannot tell the strength of your contractions. °¨ If it becomes necessary for your health care provider to know exactly how strong your contractions are or to see exactly what the baby's heart rate is doing, an internal monitor may be inserted into your vagina and uterus. Your health care provider will discuss the benefits and risks of using an internal monitor and obtain your permission before inserting the device. °¨ Continuous fetal monitoring may be needed if you have an epidural, are receiving certain medicines (such as oxytocin), or have pregnancy or labor complications. °· An IV access tube may be placed into a vein in your arm to deliver fluids and medicines if necessary. °THREE STAGES OF LABOR AND DELIVERY °Normal labor and delivery is divided into three stages. °First Stage °This stage starts when you begin to contract regularly and your cervix begins to efface and dilate. It ends when your cervix is completely open (fully dilated). The first stage is the longest stage of labor and can last from 3 hours to 15 hours.  °Several methods are available to help with labor pain. You and your health care provider will decide which option is best for you. Options include:  °· Opioid medicines. These are strong pain medicines that you can get through your IV tube or as a shot into your muscle. These medicines lessen pain but do not make it go away completely.  °· Epidural. A medicine is given through a thin tube that   is inserted in your back. The medicine numbs the lower part of your body and prevents any pain in that area. °· Paracervical pain medicine. This is an injection of an anesthetic on each side of your cervix.   °· You may request natural childbirth, which does not involve the use of pain medicines or an epidural during labor and delivery. Instead, you will use other things, such as breathing exercises, to help cope with the pain. °Second Stage °The second stage of labor  begins when your cervix is fully dilated at 10 cm. It continues until you push your baby down through the birth canal and the baby is born. This stage can take only minutes or several hours. °· The location of your baby's head as it moves through the birth canal is reported as a number called a station. If the baby's head has not started its descent, the station is described as being at minus 3 (-3). When your baby's head is at the zero station, it is at the middle of the birth canal and is engaged in the pelvis. The station of your baby helps indicate the progress of the second stage of labor. °· When your baby is born, your health care provider may hold the baby with his or her head lowered to prevent amniotic fluid, mucus, and blood from getting into the baby's lungs. The baby's mouth and nose may be suctioned with a small bulb syringe to remove any additional fluid. °· Your health care provider may then place the baby on your stomach. It is important to keep the baby from getting cold. To do this, the health care provider will dry the baby off, place the baby directly on your skin (with no blankets between you and the baby), and cover the baby with warm, dry blankets.   °· The umbilical cord is cut. °Third Stage °During the third stage of labor, your health care provider will deliver the placenta (afterbirth) and make sure your bleeding is under control. The delivery of the placenta usually takes about 5 minutes but can take up to 30 minutes. After the placenta is delivered, a medicine may be given either by IV or injection to help contract the uterus and control bleeding. If you are planning to breastfeed, you can try to do so now. °After you deliver the placenta, your uterus should contract and get very firm. If your uterus does not remain firm, your health care provider will massage it. This is important because the contraction of the uterus helps cut off bleeding at the site where the placenta was attached  to your uterus. If your uterus does not contract properly and stay firm, you may continue to bleed heavily. If there is a lot of bleeding, medicines may be given to contract the uterus and stop the bleeding.  °  °This information is not intended to replace advice given to you by your health care provider. Make sure you discuss any questions you have with your health care provider. °  °Document Released: 04/29/2008 Document Revised: 08/11/2014 Document Reviewed: 03/17/2012 °Elsevier Interactive Patient Education ©2016 [employer] Inc[employer] ° °

## 2015-05-11 NOTE — Progress Notes (Signed)
Subjective:  Bonnie White is a 29 y.o. G2P1001 at [redacted]w[redacted]d being seen today for ongoing prenatal care.  Patient reports no leaking and occasional contractions.  Contractions: Irregular.  Vag. Bleeding: None. Movement: (!) Decreased. Denies leaking of fluid.   The following portions of the patient's history were reviewed and updated as appropriate: allergies, current medications, past family history, past medical history, past social history, past surgical history and problem list.   Objective:   Filed Vitals:   05/11/15 0831  BP: 120/82  Pulse: 78  Weight: 78.472 kg (173 lb)    Fetal Status: Fetal Heart Rate (bpm): 136   Movement: (!) Decreased     General:  Alert, oriented and cooperative. Patient is in no acute distress.  Skin: Skin is warm and dry. No rash noted.   Cardiovascular: Normal heart rate noted  Respiratory: Normal respiratory effort, no problems with respiration noted  Abdomen: Soft, gravid, appropriate for gestational age. Pain/Pressure: Present     Pelvic: Vag. Bleeding: None Vag D/C Character: Watery   Cervical exam performed        Membranes swept per request  Extremities: Normal range of motion.  Edema: Trace  Mental Status: Normal mood and affect. Normal behavior. Normal judgment and thought content.   Urinalysis: Urine Protein: Negative Urine Glucose: Negative  Assessment and Plan:  Pregnancy: G2P1001 at [redacted]w[redacted]d  There are no diagnoses linked to this encounter. Term labor symptoms and general obstetric precautions including but not limited to vaginal bleeding, contractions, leaking of fluid and fetal movement were reviewed in detail with the patient. Please refer to After Visit Summary for other counseling recommendations.  No Follow-up on file. NST reactive  IOL for 05/21/15  At 0630   Aviva Signs, CNM

## 2015-05-12 ENCOUNTER — Inpatient Hospital Stay (HOSPITAL_COMMUNITY)
Admission: AD | Admit: 2015-05-12 | Discharge: 2015-05-12 | Disposition: A | Discharge status: Home / Self Care | Source: Ambulatory Visit | Attending: Obstetrics & Gynecology | Admitting: Obstetrics & Gynecology

## 2015-05-12 ENCOUNTER — Encounter (HOSPITAL_COMMUNITY): Payer: Self-pay | Admitting: *Deleted

## 2015-05-12 DIAGNOSIS — Z3491 Encounter for supervision of normal pregnancy, unspecified, first trimester: Secondary | ICD-10-CM

## 2015-05-12 DIAGNOSIS — O4700 False labor before 37 completed weeks of gestation, unspecified trimester: Secondary | ICD-10-CM

## 2015-05-12 DIAGNOSIS — O403XX0 Polyhydramnios, third trimester, not applicable or unspecified: Secondary | ICD-10-CM

## 2015-05-12 DIAGNOSIS — Z3493 Encounter for supervision of normal pregnancy, unspecified, third trimester: Secondary | ICD-10-CM | POA: Diagnosis not present

## 2015-05-12 DIAGNOSIS — O99820 Streptococcus B carrier state complicating pregnancy: Secondary | ICD-10-CM

## 2015-05-12 NOTE — MAU Note (Signed)
Contractions since 2100. Was seen at office yesterday and membranes stripped. Some bloody show. 1-2cm on Friday.

## 2015-05-12 NOTE — Discharge Instructions (Signed)
Third Trimester of Pregnancy °The third trimester is from week 29 through week 42, months 7 through 9. The third trimester is a time when the fetus is growing rapidly. At the end of the ninth month, the fetus is about 20 inches in length and weighs 6-10 pounds.  °BODY CHANGES °Your body goes through many changes during pregnancy. The changes vary from woman to woman.  °· Your weight will continue to increase. You can expect to gain 25-35 pounds (11-16 kg) by the end of the pregnancy. °· You may begin to get stretch marks on your hips, abdomen, and breasts. °· You may urinate more often because the fetus is moving lower into your pelvis and pressing on your bladder. °· You may develop or continue to have heartburn as a result of your pregnancy. °· You may develop constipation because certain hormones are causing the muscles that push waste through your intestines to slow down. °· You may develop hemorrhoids or swollen, bulging veins (varicose veins). °· You may have pelvic pain because of the weight gain and pregnancy hormones relaxing your joints between the bones in your pelvis. Backaches may result from overexertion of the muscles supporting your posture. °· You may have changes in your hair. These can include thickening of your hair, rapid growth, and changes in texture. Some women also have hair loss during or after pregnancy, or hair that feels dry or thin. Your hair will most likely return to normal after your baby is born. °· Your breasts will continue to grow and be tender. A yellow discharge may leak from your breasts called colostrum. °· Your belly button may stick out. °· You may feel short of breath because of your expanding uterus. °· You may notice the fetus "dropping," or moving lower in your abdomen. °· You may have a bloody mucus discharge. This usually occurs a few days to a week before labor begins. °· Your cervix becomes thin and soft (effaced) near your due date. °WHAT TO EXPECT AT YOUR PRENATAL  EXAMS  °You will have prenatal exams every 2 weeks until week 36. Then, you will have weekly prenatal exams. During a routine prenatal visit: °· You will be weighed to make sure you and the fetus are growing normally. °· Your blood pressure is taken. °· Your abdomen will be measured to track your baby's growth. °· The fetal heartbeat will be listened to. °· Any test results from the previous visit will be discussed. °· You may have a cervical check near your due date to see if you have effaced. °At around 36 weeks, your caregiver will check your cervix. At the same time, your caregiver will also perform a test on the secretions of the vaginal tissue. This test is to determine if a type of bacteria, Group B streptococcus, is present. Your caregiver will explain this further. °Your caregiver may ask you: °· What your birth plan is. °· How you are feeling. °· If you are feeling the baby move. °· If you have had any abnormal symptoms, such as leaking fluid, bleeding, severe headaches, or abdominal cramping. °· If you are using any tobacco products, including cigarettes, chewing tobacco, and electronic cigarettes. °· If you have any questions. °Other tests or screenings that may be performed during your third trimester include: °· Blood tests that check for low iron levels (anemia). °· Fetal testing to check the health, activity level, and growth of the fetus. Testing is done if you have certain medical conditions or if   there are problems during the pregnancy. °· HIV (human immunodeficiency virus) testing. If you are at high risk, you may be screened for HIV during your third trimester of pregnancy. °FALSE LABOR °You may feel small, irregular contractions that eventually go away. These are called Braxton Hicks contractions, or false labor. Contractions may last for hours, days, or even weeks before true labor sets in. If contractions come at regular intervals, intensify, or become painful, it is best to be seen by your  caregiver.  °SIGNS OF LABOR  °· Menstrual-like cramps. °· Contractions that are 5 minutes apart or less. °· Contractions that start on the top of the uterus and spread down to the lower abdomen and back. °· A sense of increased pelvic pressure or back pain. °· A watery or bloody mucus discharge that comes from the vagina. °If you have any of these signs before the 37th week of pregnancy, call your caregiver right away. You need to go to the hospital to get checked immediately. °HOME CARE INSTRUCTIONS  °· Avoid all smoking, herbs, alcohol, and unprescribed drugs. These chemicals affect the formation and growth of the baby. °· Do not use any tobacco products, including cigarettes, chewing tobacco, and electronic cigarettes. If you need help quitting, ask your health care provider. You may receive counseling support and other resources to help you quit. °· Follow your caregiver's instructions regarding medicine use. There are medicines that are either safe or unsafe to take during pregnancy. °· Exercise only as directed by your caregiver. Experiencing uterine cramps is a good sign to stop exercising. °· Continue to eat regular, healthy meals. °· Wear a good support bra for breast tenderness. °· Do not use hot tubs, steam rooms, or saunas. °· Wear your seat belt at all times when driving. °· Avoid raw meat, uncooked cheese, cat litter boxes, and soil used by cats. These carry germs that can cause birth defects in the baby. °· Take your prenatal vitamins. °· Take 1500-2000 mg of calcium daily starting at the 20th week of pregnancy until you deliver your baby. °· Try taking a stool softener (if your caregiver approves) if you develop constipation. Eat more high-fiber foods, such as fresh vegetables or fruit and whole grains. Drink plenty of fluids to keep your urine clear or pale yellow. °· Take warm sitz baths to soothe any pain or discomfort caused by hemorrhoids. Use hemorrhoid cream if your caregiver approves. °· If  you develop varicose veins, wear support hose. Elevate your feet for 15 minutes, 3-4 times a day. Limit salt in your diet. °· Avoid heavy lifting, wear low heal shoes, and practice good posture. °· Rest a lot with your legs elevated if you have leg cramps or low back pain. °· Visit your dentist if you have not gone during your pregnancy. Use a soft toothbrush to brush your teeth and be gentle when you floss. °· A sexual relationship may be continued unless your caregiver directs you otherwise. °· Do not travel far distances unless it is absolutely necessary and only with the approval of your caregiver. °· Take prenatal classes to understand, practice, and ask questions about the labor and delivery. °· Make a trial run to the hospital. °· Pack your hospital bag. °· Prepare the baby's nursery. °· Continue to go to all your prenatal visits as directed by your caregiver. °SEEK MEDICAL CARE IF: °· You are unsure if you are in labor or if your water has broken. °· You have dizziness. °· You have   mild pelvic cramps, pelvic pressure, or nagging pain in your abdominal area.  You have persistent nausea, vomiting, or diarrhea.  You have a bad smelling vaginal discharge.  You have pain with urination. SEEK IMMEDIATE MEDICAL CARE IF:   You have a fever.  You are leaking fluid from your vagina.  You have spotting or bleeding from your vagina.  You have severe abdominal cramping or pain.  You have rapid weight loss or gain.  You have shortness of breath with chest pain.  You notice sudden or extreme swelling of your face, hands, ankles, feet, or legs.  You have not felt your baby move in over an hour.  You have severe headaches that do not go away with medicine.  You have vision changes.   This information is not intended to replace advice given to you by your health care provider. Make sure you discuss any questions you have with your health care provider.   Document Released: 07/15/2001 Document  Revised: 08/11/2014 Document Reviewed: 09/21/2012 Elsevier Interactive Patient Education 2016 [employer] of the uterus can occur throughout pregnancy. Contractions are not always a sign that you are in labor.  WHAT ARE BRAXTON HICKS CONTRACTIONS?  Contractions that occur before labor are called Braxton Hicks contractions, or false labor. Toward the end of pregnancy (32-34 weeks), these contractions can develop more often and may become more forceful. This is not true labor because these contractions do not result in opening (dilatation) and thinning of the cervix. They are sometimes difficult to tell apart from true labor because these contractions can be forceful and people have different pain tolerances. You should not feel embarrassed if you go to the hospital with false labor. Sometimes, the only way to tell if you are in true labor is for your health care provider to look for changes in the cervix. If there are no prenatal problems or other health problems associated with the pregnancy, it is completely safe to be sent home with false labor and await the onset of true labor. HOW CAN YOU TELL THE DIFFERENCE BETWEEN TRUE AND FALSE LABOR? False Labor  The contractions of false labor are usually shorter and not as hard as those of true labor.   The contractions are usually irregular.   The contractions are often felt in the front of the lower abdomen and in the groin.   The contractions may go away when you walk around or change positions while lying down.   The contractions get weaker and are shorter lasting as time goes on.   The contractions do not usually become progressively stronger, regular, and closer together as with true labor.  True Labor  Contractions in true labor last 30-70 seconds, become very regular, usually become more intense, and increase in frequency.   The contractions do not go away with walking.   The discomfort  is usually felt in the top of the uterus and spreads to the lower abdomen and low back.   True labor can be determined by your health care provider with an exam. This will show that the cervix is dilating and getting thinner.  WHAT TO REMEMBER  Keep up with your usual exercises and follow other instructions given by your health care provider.   Take medicines as directed by your health care provider.   Keep your regular prenatal appointments.   Eat and drink lightly if you think you are going into labor.   If Braxton Hicks contractions are making  you uncomfortable:   °· Change your position from lying down or resting to walking, or from walking to resting.   °· Sit and rest in a tub of warm water.   °· Drink 2-3 glasses of water. Dehydration may cause these contractions.   °· Do slow and deep breathing several times an hour.   °WHEN SHOULD I SEEK IMMEDIATE MEDICAL CARE? °Seek immediate medical care if: °· Your contractions become stronger, more regular, and closer together.   °· You have fluid leaking or gushing from your vagina.   °· You have a fever.   °· You pass blood-tinged mucus.   °· You have vaginal bleeding.   °· You have continuous abdominal pain.   °· You have low back pain that you never had before.   °· You feel your baby's head pushing down and causing pelvic pressure.   °· Your baby is not moving as much as it used to.   °  °This information is not intended to replace advice given to you by your health care provider. Make sure you discuss any questions you have with your health care provider. °  °Document Released: 07/21/2005 Document Revised: 07/26/2013 Document Reviewed: 05/02/2013 °Elsevier Interactive Patient Education ©2016 [employer] Inc[employer] ° °

## 2015-05-15 ENCOUNTER — Ambulatory Visit (INDEPENDENT_AMBULATORY_CARE_PROVIDER_SITE_OTHER): Admitting: *Deleted

## 2015-05-15 VITALS — BP 112/72 | HR 114 | Wt 173.0 lb

## 2015-05-15 DIAGNOSIS — O480 Post-term pregnancy: Secondary | ICD-10-CM

## 2015-05-18 ENCOUNTER — Encounter (HOSPITAL_COMMUNITY): Payer: Self-pay | Admitting: *Deleted

## 2015-05-18 ENCOUNTER — Telehealth (HOSPITAL_COMMUNITY): Payer: Self-pay | Admitting: *Deleted

## 2015-05-18 ENCOUNTER — Ambulatory Visit (INDEPENDENT_AMBULATORY_CARE_PROVIDER_SITE_OTHER): Admitting: Advanced Practice Midwife

## 2015-05-18 VITALS — BP 133/69 | HR 71 | Wt 179.0 lb

## 2015-05-18 DIAGNOSIS — O480 Post-term pregnancy: Secondary | ICD-10-CM | POA: Diagnosis not present

## 2015-05-18 DIAGNOSIS — Z3483 Encounter for supervision of other normal pregnancy, third trimester: Secondary | ICD-10-CM

## 2015-05-18 NOTE — Telephone Encounter (Signed)
Preadmission screen  

## 2015-05-18 NOTE — Progress Notes (Signed)
Subjective:  Ashley Royaltyllen C Gorsline is a 29 y.o. G2P1001 at 429w0d being seen today for ongoing prenatal care.  Patient reports no complaints.  Contractions: Irregular.  Vag. Bleeding: None. Movement: Present. Denies leaking of fluid.   The following portions of the patient's history were reviewed and updated as appropriate: allergies, current medications, past family history, past medical history, past social history, past surgical history and problem list. Problem list updated.  Objective:   Filed Vitals:   05/18/15 0855  BP: 133/69  Pulse: 71  Weight: 81.194 kg (179 lb)    Fetal Status: Fetal Heart Rate (bpm): NST   Movement: Present  Presentation: Vertex  General:  Alert, oriented and cooperative. Patient is in no acute distress.  Skin: Skin is warm and dry. No rash noted.   Cardiovascular: Normal heart rate noted  Respiratory: Normal respiratory effort, no problems with respiration noted  Abdomen: Soft, gravid, appropriate for gestational age. Pain/Pressure: Present     Pelvic: Vag. Bleeding: None Vag D/C Character: Thin   Cervical exam performed Dilation: 3 Effacement (%): 60 Station: -3  Extremities: Normal range of motion.  Edema: Mild pitting, slight indentation  Mental Status: Normal mood and affect. Normal behavior. Normal judgment and thought content.   Urinalysis:      Assessment and Plan:  Pregnancy: G2P1001 at 429w0d  1. Post-term pregnancy, 40-42 weeks of gestation  - Fetal nonstress test - Membranes swept during cervical exam today at pt request  Term labor symptoms and general obstetric precautions including but not limited to vaginal bleeding, contractions, leaking of fluid and fetal movement were reviewed in detail with the patient. Please refer to After Visit Summary for other counseling recommendations.   IOL scheduled 05/21/15.  No Follow-up on file.   Hurshel PartyLisa A Leftwich-Kirby, CNM

## 2015-05-21 ENCOUNTER — Inpatient Hospital Stay (HOSPITAL_COMMUNITY): Admitting: Anesthesiology

## 2015-05-21 ENCOUNTER — Encounter (HOSPITAL_COMMUNITY): Payer: Self-pay

## 2015-05-21 ENCOUNTER — Inpatient Hospital Stay (HOSPITAL_COMMUNITY)
Admission: RE | Admit: 2015-05-21 | Discharge: 2015-05-23 | DRG: 775 | Disposition: A | Discharge status: Home / Self Care | Source: Ambulatory Visit | Attending: Family Medicine | Admitting: Family Medicine

## 2015-05-21 DIAGNOSIS — O99214 Obesity complicating childbirth: Secondary | ICD-10-CM | POA: Diagnosis present

## 2015-05-21 DIAGNOSIS — Z8759 Personal history of other complications of pregnancy, childbirth and the puerperium: Secondary | ICD-10-CM

## 2015-05-21 DIAGNOSIS — O403XX0 Polyhydramnios, third trimester, not applicable or unspecified: Secondary | ICD-10-CM | POA: Diagnosis present

## 2015-05-21 DIAGNOSIS — Z6831 Body mass index (BMI) 31.0-31.9, adult: Secondary | ICD-10-CM

## 2015-05-21 DIAGNOSIS — E669 Obesity, unspecified: Secondary | ICD-10-CM | POA: Diagnosis present

## 2015-05-21 DIAGNOSIS — O99820 Streptococcus B carrier state complicating pregnancy: Secondary | ICD-10-CM | POA: Diagnosis not present

## 2015-05-21 DIAGNOSIS — Z820 Family history of epilepsy and other diseases of the nervous system: Secondary | ICD-10-CM | POA: Diagnosis not present

## 2015-05-21 DIAGNOSIS — Z3A41 41 weeks gestation of pregnancy: Secondary | ICD-10-CM

## 2015-05-21 DIAGNOSIS — Z8379 Family history of other diseases of the digestive system: Secondary | ICD-10-CM | POA: Diagnosis not present

## 2015-05-21 DIAGNOSIS — Z0372 Encounter for suspected placental problem ruled out: Secondary | ICD-10-CM | POA: Diagnosis present

## 2015-05-21 DIAGNOSIS — Z3491 Encounter for supervision of normal pregnancy, unspecified, first trimester: Secondary | ICD-10-CM

## 2015-05-21 DIAGNOSIS — O480 Post-term pregnancy: Secondary | ICD-10-CM | POA: Diagnosis present

## 2015-05-21 DIAGNOSIS — O99824 Streptococcus B carrier state complicating childbirth: Secondary | ICD-10-CM | POA: Diagnosis present

## 2015-05-21 DIAGNOSIS — Z3490 Encounter for supervision of normal pregnancy, unspecified, unspecified trimester: Secondary | ICD-10-CM

## 2015-05-21 DIAGNOSIS — Z87891 Personal history of nicotine dependence: Secondary | ICD-10-CM | POA: Diagnosis not present

## 2015-05-21 LAB — CBC
HCT: 33 % — ABNORMAL LOW (ref 36.0–46.0)
Hemoglobin: 11.1 g/dL — ABNORMAL LOW (ref 12.0–15.0)
MCH: 31.1 pg (ref 26.0–34.0)
MCHC: 33.6 g/dL (ref 30.0–36.0)
MCV: 92.4 fL (ref 78.0–100.0)
PLATELETS: 184 10*3/uL (ref 150–400)
RBC: 3.57 MIL/uL — ABNORMAL LOW (ref 3.87–5.11)
RDW: 13 % (ref 11.5–15.5)
WBC: 8 10*3/uL (ref 4.0–10.5)

## 2015-05-21 LAB — TYPE AND SCREEN
ABO/RH(D): O POS
ANTIBODY SCREEN: NEGATIVE

## 2015-05-21 LAB — RPR: RPR: NONREACTIVE

## 2015-05-21 MED ORDER — FLEET ENEMA 7-19 GM/118ML RE ENEM: 1.0000 | ENEMA | RECTAL | Status: DC | PRN

## 2015-05-21 MED ORDER — ACETAMINOPHEN 325 MG PO TABS: 650.0000 mg | ORAL_TABLET | ORAL | Status: DC | PRN

## 2015-05-21 MED ORDER — DIPHENHYDRAMINE HCL 50 MG/ML IJ SOLN: 12.5000 mg | INTRAMUSCULAR | Status: DC | PRN

## 2015-05-21 MED ORDER — ONDANSETRON HCL 4 MG PO TABS: 4.0000 mg | ORAL_TABLET | ORAL | Status: DC | PRN

## 2015-05-21 MED ORDER — ONDANSETRON HCL 4 MG/2ML IJ SOLN: 4.0000 mg | INTRAMUSCULAR | Status: DC | PRN

## 2015-05-21 MED ORDER — SIMETHICONE 80 MG PO CHEW: 80.0000 mg | CHEWABLE_TABLET | ORAL | Status: DC | PRN

## 2015-05-21 MED ORDER — OXYTOCIN 40 UNITS IN LACTATED RINGERS INFUSION - SIMPLE MED
1.0000 m[IU]/min | INTRAVENOUS | Status: DC
  Administered 2015-05-21: 2 m[IU]/min via INTRAVENOUS
  Filled 2015-05-21: qty 1000

## 2015-05-21 MED ORDER — LACTATED RINGERS IV SOLN: 500.0000 mL | INTRAVENOUS | Status: DC | PRN

## 2015-05-21 MED ORDER — LIDOCAINE HCL (PF) 1 % IJ SOLN
INTRAMUSCULAR | Status: DC | PRN
  Administered 2015-05-21: 6 mL via EPIDURAL
  Administered 2015-05-21: 4 mL via EPIDURAL

## 2015-05-21 MED ORDER — PRENATAL MULTIVITAMIN CH
1.0000 | ORAL_TABLET | Freq: Every day | ORAL | Status: DC
  Administered 2015-05-22 – 2015-05-23 (×2): 1 via ORAL
  Filled 2015-05-21 (×2): qty 1

## 2015-05-21 MED ORDER — FENTANYL 2.5 MCG/ML BUPIVACAINE 1/10 % EPIDURAL INFUSION (WH - ANES)
INTRAMUSCULAR | Status: AC
  Filled 2015-05-21: qty 125

## 2015-05-21 MED ORDER — ONDANSETRON HCL 4 MG/2ML IJ SOLN
4.0000 mg | Freq: Four times a day (QID) | INTRAMUSCULAR | Status: DC | PRN
  Administered 2015-05-21: 4 mg via INTRAVENOUS
  Filled 2015-05-21: qty 2

## 2015-05-21 MED ORDER — FENTANYL CITRATE (PF) 100 MCG/2ML IJ SOLN
100.0000 ug | INTRAMUSCULAR | Status: DC | PRN
  Administered 2015-05-21: 100 ug via INTRAVENOUS
  Filled 2015-05-21: qty 2

## 2015-05-21 MED ORDER — INFLUENZA VAC SPLIT QUAD 0.5 ML IM SUSY
0.5000 mL | PREFILLED_SYRINGE | INTRAMUSCULAR | Status: AC
  Administered 2015-05-22: 0.5 mL via INTRAMUSCULAR

## 2015-05-21 MED ORDER — OXYTOCIN BOLUS FROM INFUSION: 500.0000 mL | INTRAVENOUS | Status: DC

## 2015-05-21 MED ORDER — DEXTROSE 5 % IV SOLN
5.0000 10*6.[IU] | Freq: Once | INTRAVENOUS | Status: AC
  Administered 2015-05-21: 5 10*6.[IU] via INTRAVENOUS
  Filled 2015-05-21: qty 5

## 2015-05-21 MED ORDER — OXYCODONE-ACETAMINOPHEN 5-325 MG PO TABS
1.0000 | ORAL_TABLET | ORAL | Status: DC | PRN
Start: 2015-05-21 — End: 2015-05-21

## 2015-05-21 MED ORDER — TERBUTALINE SULFATE 1 MG/ML IJ SOLN
0.2500 mg | Freq: Once | INTRAMUSCULAR | Status: DC | PRN
  Filled 2015-05-21: qty 1

## 2015-05-21 MED ORDER — LANOLIN HYDROUS EX OINT: TOPICAL_OINTMENT | CUTANEOUS | Status: DC | PRN

## 2015-05-21 MED ORDER — OXYCODONE-ACETAMINOPHEN 5-325 MG PO TABS
1.0000 | ORAL_TABLET | ORAL | Status: DC | PRN
  Administered 2015-05-22 – 2015-05-23 (×3): 1 via ORAL
  Filled 2015-05-21 (×3): qty 1

## 2015-05-21 MED ORDER — LIDOCAINE HCL (PF) 1 % IJ SOLN
30.0000 mL | INTRAMUSCULAR | Status: DC | PRN
  Filled 2015-05-21: qty 30

## 2015-05-21 MED ORDER — PHENYLEPHRINE 40 MCG/ML (10ML) SYRINGE FOR IV PUSH (FOR BLOOD PRESSURE SUPPORT)
PREFILLED_SYRINGE | INTRAVENOUS | Status: AC
  Filled 2015-05-21: qty 20

## 2015-05-21 MED ORDER — EPHEDRINE 5 MG/ML INJ
10.0000 mg | INTRAVENOUS | Status: DC | PRN
  Filled 2015-05-21: qty 2

## 2015-05-21 MED ORDER — OXYCODONE-ACETAMINOPHEN 5-325 MG PO TABS
2.0000 | ORAL_TABLET | ORAL | Status: DC | PRN
Start: 2015-05-21 — End: 2015-05-21

## 2015-05-21 MED ORDER — WITCH HAZEL-GLYCERIN EX PADS: 1.0000 "application " | MEDICATED_PAD | CUTANEOUS | Status: DC | PRN

## 2015-05-21 MED ORDER — PENICILLIN G POTASSIUM 5000000 UNITS IJ SOLR
2.5000 10*6.[IU] | INTRAMUSCULAR | Status: DC
  Administered 2015-05-21 (×3): 2.5 10*6.[IU] via INTRAVENOUS
  Filled 2015-05-21 (×8): qty 2.5

## 2015-05-21 MED ORDER — DIBUCAINE 1 % RE OINT: 1.0000 "application " | TOPICAL_OINTMENT | RECTAL | Status: DC | PRN

## 2015-05-21 MED ORDER — LACTATED RINGERS IV SOLN
INTRAVENOUS | Status: DC
  Administered 2015-05-21 (×2): via INTRAVENOUS

## 2015-05-21 MED ORDER — OXYTOCIN 40 UNITS IN LACTATED RINGERS INFUSION - SIMPLE MED: 62.5000 mL/h | INTRAVENOUS | Status: DC | PRN

## 2015-05-21 MED ORDER — IBUPROFEN 600 MG PO TABS
600.0000 mg | ORAL_TABLET | Freq: Four times a day (QID) | ORAL | Status: DC
  Administered 2015-05-22 – 2015-05-23 (×7): 600 mg via ORAL
  Filled 2015-05-21 (×7): qty 1

## 2015-05-21 MED ORDER — BENZOCAINE-MENTHOL 20-0.5 % EX AERO: 1.0000 "application " | INHALATION_SPRAY | CUTANEOUS | Status: DC | PRN

## 2015-05-21 MED ORDER — CITRIC ACID-SODIUM CITRATE 334-500 MG/5ML PO SOLN: 30.0000 mL | ORAL | Status: DC | PRN

## 2015-05-21 MED ORDER — FENTANYL 2.5 MCG/ML BUPIVACAINE 1/10 % EPIDURAL INFUSION (WH - ANES)
14.0000 mL/h | INTRAMUSCULAR | Status: DC | PRN
  Administered 2015-05-21: 9 mL/h via EPIDURAL

## 2015-05-21 MED ORDER — PHENYLEPHRINE 40 MCG/ML (10ML) SYRINGE FOR IV PUSH (FOR BLOOD PRESSURE SUPPORT)
80.0000 ug | PREFILLED_SYRINGE | INTRAVENOUS | Status: DC | PRN
  Filled 2015-05-21: qty 2

## 2015-05-21 MED ORDER — OXYTOCIN 40 UNITS IN LACTATED RINGERS INFUSION - SIMPLE MED: 62.5000 mL/h | INTRAVENOUS | Status: DC

## 2015-05-21 MED ORDER — DIPHENHYDRAMINE HCL 25 MG PO CAPS: 25.0000 mg | ORAL_CAPSULE | Freq: Four times a day (QID) | ORAL | Status: DC | PRN

## 2015-05-21 MED ORDER — DOCUSATE SODIUM 100 MG PO CAPS
100.0000 mg | ORAL_CAPSULE | Freq: Two times a day (BID) | ORAL | Status: DC
  Administered 2015-05-22 – 2015-05-23 (×3): 100 mg via ORAL
  Filled 2015-05-21 (×3): qty 1

## 2015-05-21 NOTE — H&P (Signed)
OBSTETRIC ADMISSION HISTORY AND PHYSICAL  Bonnie White is a 29 y.o. female G2P1001 with IUP at 1961w3d by L/7 presenting for postdates IOL. She reports +FMs, No LOF, no VB, no blurry vision, headaches or peripheral edema, and RUQ pain.  She plans on breast feeding. She request IUD-Mirena for birth control.  Dating: By L/7 --->  Estimated Date of Delivery: 05/11/15   Prenatal History/Complications:  Clinic KV Prenatal Labs  Dating 7 week US Blood type: --/--/O POS, O POS (07/06 10270615)   Genetic Screen 1 Screen: Normal  AFP:   Neg  Quad:     NIPS:   CF: Neg Antibody:NEG (07/06 0615)  Anatomic US  Normal female, except for LVEIF Rubella: 1.99 (02/22 1107)  GTT Early:               Third trimester: 151. 3 hour GTT one elevated value RPR: NON REAC (02/22 1107)   Flu vaccine 09/25/14 HBsAg: NEGATIVE (02/22 1107)   TDaP vaccine 02/12/15                                        Rhogam: NA HIV: NONREACTIVE (02/22 1107)   GBS     Pos                                         (For PCN allergy, check sensitivities) NA GBS: Pos  Contraception Mirena Pap:09/25/14 neg  Baby Food Breast   Circumcision IP   Pediatrician    Support Person FOB-jason    Past Medical History: Past Medical History  Diagnosis Date  . Medical history non-contributory     Past Surgical History: Past Surgical History  Procedure Laterality Date  . Wisdom tooth extraction    . No past surgeries      Obstetrical History: OB History    Gravida Para Term Preterm AB TAB SAB Ectopic Multiple Living   2 1 1       1       Social History: Social History   Social History  . Marital Status: Married    Spouse Name: N/A  . Number of Children: N/A  . Years of Education: N/A   Social History Main Topics  . Smoking status: Former Smoker -- 8.00 packs/day for 1 years    Types: Cigarettes  . Smokeless tobacco: Never Used  . Alcohol Use: No  . Drug Use: No  . Sexual Activity:    Partners: Male   Other Topics Concern  . None    Social History Narrative    Family History: Family History  Problem Relation Age of Onset  . Ulcerative colitis Father   . Seizures Sister     Allergies: No Known Allergies  Prescriptions prior to admission  Medication Sig Dispense Refill Last Dose  . acetaminophen (TYLENOL) 325 MG tablet Take 650 mg by mouth every 6 (six) hours as needed for moderate pain.   05/20/2015 at Unknown time  . calcium carbonate (TUMS - DOSED IN MG ELEMENTAL CALCIUM) 500 MG chewable tablet Chew 2 tablets by mouth as needed for indigestion or heartburn.   05/21/2015 at Unknown time  . Prenatal Multivit-Min-Fe-FA (PRENATAL VITAMINS PO) Take 1 tablet by mouth daily.    05/20/2015 at Unknown time  . zolpidem (AMBIEN) 5 MG tablet Take 1  tablet (5 mg total) by mouth at bedtime. 30 tablet 1 05/20/2015 at Unknown time  . cyclobenzaprine (FLEXERIL) 10 MG tablet Take 0.5 tablets (5 mg total) by mouth 3 (three) times daily as needed for muscle spasms. Muscle spasms (Patient not taking: Reported on 05/21/2015) 20 tablet 0 Not Taking at Unknown time     Review of Systems   All systems reviewed and negative except as stated in HPI  Blood pressure 128/95, pulse 83, temperature 97.7 F (36.5 C), temperature source Oral, resp. rate 17, height  (1.6 m), weight 175 lb (79.379 kg), last menstrual period 07/28/2014. General appearance: alert, cooperative and appears stated age Lungs: clear to auscultation bilaterally Heart: regular rate and rhythm Abdomen: soft, non-tender; bowel sounds normal Extremities: Homans sign is negative, no sign of DVT DTR's 2+ Presentation: cephalic Fetal monitoringBaseline: 125 bpm, Variability: Good {> 6 bpm), Accelerations: Reactive and Decelerations: Absent Uterine activityNone Dilation: 3 Effacement (%): 60 Station: -3 Exam by:: Rolan Bucco, RN   Prenatal labs: ABO, Rh: --/--/O POS (10/17 1610) Antibody: NEG (10/17 0625) Rubella:   RPR: NON REAC (07/11 0934)  HBsAg:  NEGATIVE (02/22 1107)  HIV: NONREACTIVE (07/11 0934)  GBS: Positive (09/09 0000)    Prenatal Transfer Tool  Maternal Diabetes: No- abnormal 1 hr (151),  Passed 3hr Genetic Screening: Normal Maternal Ultrasounds/Referrals: Normal Fetal Ultrasounds or other Referrals:  None Maternal Substance Abuse:  No Significant Maternal Medications:  None Significant Maternal Lab Results: Lab values include: Group B Strep positive  Results for orders placed or performed during the hospital encounter of 05/21/15 (from the past 24 hour(s))  CBC   Collection Time: 05/21/15  6:25 AM  Result Value Ref Range   WBC 8.0 4.0 - 10.5 K/uL   RBC 3.57 (L) 3.87 - 5.11 MIL/uL   Hemoglobin 11.1 (L) 12.0 - 15.0 g/dL   HCT 96.0 (L) 45.4 - 09.8 %   MCV 92.4 78.0 - 100.0 fL   MCH 31.1 26.0 - 34.0 pg   MCHC 33.6 30.0 - 36.0 g/dL   RDW 11.9 14.7 - 82.9 %   Platelets 184 150 - 400 K/uL  Type and screen [facility] [place]   Collection Time: 05/21/15  6:25 AM  Result Value Ref Range   ABO/RH(D) O POS    Antibody Screen NEG    Sample Expiration 05/24/2015     Patient Active Problem List   Diagnosis Date Noted  . Pregnancy 05/21/2015  . Polyhydramnios, third trimester, antepartum condition or complication 04/23/2015  . Group B Streptococcus carrier, antepartum 04/23/2015  . Threatened preterm labor, antepartum 04/13/2015  . Preterm labor in second trimester without delivery 02/07/2015  . Placental problem suspected but not found   . Supervision of normal pregnancy in first trimester 09/25/2014    Assessment: Bonnie White is a 29 y.o. G2P1001 at [redacted]w[redacted]d here for postdates induction of labor  #Labor: Pitocin started given favorable cervix and Bishop score 7.  #Pain: Prn pain medications, prn epidural #FWB: Cat I #ID: GBS positive, started PCN #MOF: Breast #MOC:IUD Mirena #Circ:  Desired inpatient, infant will be on [employer].  Consented for circumcision and consent form signed and in chart.    Isa Rankin [facility] 05/21/2015, 11:30 AM

## 2015-05-21 NOTE — Anesthesia Procedure Notes (Signed)
Epidural Patient location during procedure: OB Start time: 05/21/2015 7:56 PM End time: 05/21/2015 8:00 PM  Staffing Anesthesiologist: Leilani AbleHATCHETT, Caroleena Paolini Performed by: anesthesiologist   Preanesthetic Checklist Completed: patient identified, surgical consent, pre-op evaluation, timeout performed, IV checked, risks and benefits discussed and monitors and equipment checked  Epidural Patient position: sitting Prep: site prepped and draped and DuraPrep Patient monitoring: continuous pulse ox and blood pressure Approach: midline Location: L3-L4 Injection technique: LOR air  Needle:  Needle type: Tuohy  Needle gauge: 17 G Needle length: 9 cm and 9 Needle insertion depth: 5 cm cm Catheter type: closed end flexible Catheter size: 19 Gauge Catheter at skin depth: 10 cm Test dose: negative and Other  Assessment Sensory level: T9 Events: blood not aspirated, injection not painful, no injection resistance, negative IV test and no paresthesia  Additional Notes Reason for block:procedure for pain

## 2015-05-21 NOTE — Progress Notes (Signed)
Labor Progress Note Bonnie White is a 29 y.o. G2P1001 at 363w3d presented for PD-IOL   S: Contractions are now more intense.   O:  BP 142/87 mmHg  Pulse 69  Temp(Src) 98.8 F (37.1 C) (Oral)  Resp 19  Ht 5\' 3"  (1.6 m)  Wt 175 lb (79.379 kg)  BMI 31.01 kg/m2  LMP 07/28/2014 (Approximate) EFM: 135/mod/+accels, no decels  CVE: Dilation: 5.5 Effacement (%): 100 Cervical Position: Posterior Station: -2 Exam by:: Ralene BatheK. Timothy Trudell, MD   A&P: 29 y.o. G2P1001 3263w3d here for PD-IOL #Labor: Continue pitocin, entering into Active phase. Patient SROMed at [id number].  AROM at ~1900 of forebag.  Expect vaginal delivery.  #Pain: Coping well currently. IV Fentanyl prn #FWB: Cat I #GBS POS, on PCN  Bonnie FlakeKimberly Niles Jomes Giraldo, MD 7:00 PM

## 2015-05-21 NOTE — Progress Notes (Signed)
Discussed the frequency of intermittent maternal HR tracing for FHR with Dr. Alvester MorinNewton. Pt is actively walking, on birthing ball, and moving around in room on the wireless monitors for comfort measures with contractions. Dr is aware of reactive tracing and is okay with FHR strip. Plan to check cervix at 1300 per MD discussed with patient who agrees with plan of care.

## 2015-05-21 NOTE — Progress Notes (Signed)
Labor Progress Note Bonnie White is a 29 y.o. G2P1001 at 4548w3d presented for  PD-IOL  S: feeling ctx more strongly  O:  BP 141/85 mmHg  Pulse 65  Temp(Src) 98 F (36.7 C) (Oral)  Resp 19  Ht 5\' 3"  (1.6 m)  Wt 175 lb (79.379 kg)  BMI 31.01 kg/m2  LMP 07/28/2014 (Approximate) EFM: 145/mod/+accels, no decels  CVE: Dilation: 3.5 Effacement (%): 60 Cervical Position: Posterior Station: [place], -3 Exam by:: Bonnie BatheK. Patina Spanier, MD   A&P: 29 y.o. G2P1001 7348w3d here for PD-IOL #Labor: continue pitocin.  #Pain: prn IV fentanyl #FWB: Cat I #GBS Pos, PCN started. ROM when appropriate.   Bonnie FlakeKimberly Niles Chiara Coltrin, MD 1:42 PM

## 2015-05-21 NOTE — Progress Notes (Signed)
Reviewed strip with RN 155/mod/+accels (multiple), occasional variable but not recurrent nor deep.  Low risk of acidemia.   Federico FlakeKimberly Niles Felicidad Sugarman, MD

## 2015-05-21 NOTE — Anesthesia Preprocedure Evaluation (Signed)
Anesthesia Evaluation  Patient identified by MRN, date of birth, ID band Patient awake    Reviewed: Allergy & Precautions, H&P , NPO status , Patient's Chart, lab work & pertinent test results  Airway Mallampati: I  TM Distance: >3 FB Neck ROM: full    Dental no notable dental hx.    Pulmonary former smoker,    Pulmonary exam normal        Cardiovascular negative cardio ROS Normal cardiovascular exam     Neuro/Psych negative neurological ROS  negative psych ROS   GI/Hepatic negative GI ROS, Neg liver ROS,   Endo/Other  negative endocrine ROS  Renal/GU negative Renal ROS     Musculoskeletal   Abdominal (+) + obese,   Peds  Hematology negative hematology ROS (+)   Anesthesia Other Findings   Reproductive/Obstetrics (+) Pregnancy                             Anesthesia Physical Anesthesia Plan  ASA: II  Anesthesia Plan: Epidural   Post-op Pain Management:    Induction:   Airway Management Planned:   Additional Equipment:   Intra-op Plan:   Post-operative Plan:   Informed Consent: I have reviewed the patients History and Physical, chart, labs and discussed the procedure including the risks, benefits and alternatives for the proposed anesthesia with the patient or authorized representative who has indicated his/her understanding and acceptance.     Plan Discussed with:   Anesthesia Plan Comments:         Anesthesia Quick Evaluation  

## 2015-05-22 DIAGNOSIS — Z3A41 41 weeks gestation of pregnancy: Secondary | ICD-10-CM

## 2015-05-22 DIAGNOSIS — O99820 Streptococcus B carrier state complicating pregnancy: Secondary | ICD-10-CM

## 2015-05-22 DIAGNOSIS — O480 Post-term pregnancy: Secondary | ICD-10-CM

## 2015-05-22 NOTE — Anesthesia Postprocedure Evaluation (Signed)
  Anesthesia Post-op Note  Patient: Bonnie White  Procedure(s) Performed: * No procedures listed *  Patient Location: PACU and Mother/Baby  Anesthesia Type:Epidural  Level of Consciousness: awake, alert  and oriented  Airway and Oxygen Therapy: Patient Spontanous Breathing  Post-op Pain: none  Post-op Assessment: Post-op Vital signs reviewed, Patient's Cardiovascular Status Stable, No headache, No backache, No residual numbness and No residual motor weakness  Post-op Vital Signs: Reviewed and stable  Complications: No apparent anesthesia complications

## 2015-05-22 NOTE — Lactation Note (Signed)
This note was copied from the chart of Boy Edilia Bollen Rubano. Lactation Consultation Note  Patient Name: Boy Edilia Bollen Stadel ZHYQM'VToday's Date: 05/22/2015 Reason for consult: Initial assessment   Initial consult with 16 hour old infant. Mom reports that she BF first infant for 7 weeks. She reported she had to supplement starting at about 5 weeks and then supply dwindled more when she returned to work. She reports breast changes with this pregnancy. Infant was asleep when I was in the room. Baby with 7 BF for 10- 20  Minutes, 2 voids, and 7 stools since birth. Mom reports she feels infant is feeding well. She reports she is feeding with feeding cues and sometimes needs to flange lips when he latches. She denies any nipple tenderness/soreness. Discussed NL NB feeding behavior including cluster feeding, BF basics, lip flanging, stomach size, NB Nutritional needs, I/O, feeding log, positioning with pillows, and nipple care. Mom has a Medela DEBP at home for prn pumping and return to work. Reviewed BF information in Taking Care of Baby and Me Booklet. LB Brochure given, discussed BF Support Groups, OP services, and BF Resources handout. Mom denies questions at this time. To call prn assistance/questions/concerns.    Maternal Data Formula Feeding for Exclusion: No Does the patient have breastfeeding experience prior to this delivery?: Yes  Feeding Feeding Type: Breast Fed Length of feed: 20 min  LATCH Score/Interventions Latch: Grasps breast easily, tongue down, lips flanged, rhythmical sucking.  Audible Swallowing: A few with stimulation Intervention(s): Skin to skin  Type of Nipple: Everted at rest and after stimulation  Comfort (Breast/Nipple): Soft / non-tender     Hold (Positioning): No assistance needed to correctly position infant at breast.  LATCH Score: 9  Lactation Tools Discussed/Used Pump Review: Milk Storage   Consult Status Consult Status: PRN    Silas FloodSharon S Amr Sturtevant 05/22/2015,  2:17 PM

## 2015-05-23 MED ORDER — IBUPROFEN 600 MG PO TABS: 600.0000 mg | ORAL_TABLET | Freq: Four times a day (QID) | ORAL | Status: DC

## 2015-05-23 NOTE — Discharge Instructions (Signed)

## 2015-05-23 NOTE — Discharge Summary (Signed)
OB Discharge Summary    Patient Name: Bonnie White DOB: Oct 20, 1985 MRN: [id number]  Date of admission: 05/21/2015 Delivering MD: Lyndel Safe NILES   Date of discharge: 05/23/2015  Admitting diagnosis: Induction Intrauterine pregnancy: [redacted]w[redacted]d     Secondary diagnosis: Principal Problem:   NSVD (normal spontaneous vaginal delivery) Active Problems:   Supervision of normal pregnancy in first trimester   Placental problem suspected but not found   Preterm labor in second trimester without delivery   Group B Streptococcus carrier, antepartum   Pregnancy   Personal history of previous postdates pregnancy      Discharge diagnosis: Term Pregnancy Delivered                                                                                                Post partum procedures: Manual extraction of placenta  Augmentation: Pitocin Complications: None  Hospital course:   Induction of Labor With Vaginal Delivery   29 y.o. yo [id number] at [redacted]w[redacted]d was admitted to the hospital 05/21/2015 for induction of labor.  Indication for induction: Postdates.  Patient had an uncomplicated labor course as follows: Membrane Rupture Time/Date: 3:13 PM ,05/21/2015   Intrapartum Procedures: Episiotomy: None [1]                                         Lacerations:  None [1]  Patient had delivery of a Viable infant.  She required a manual extraction of placenta and the specimen appeared intact. No signs/symptoms endometritis post-partum.   Information for the patient's newborn:  Bonnie, White [[id number]]  Delivery Method: Vaginal, Spontaneous Delivery (Filed from Delivery Summary)   05/21/2015  Details of delivery can be found in separate delivery note.  Patient had a routine postpartum course. Patient is discharged home 05/23/2015  Exam at time of discharge: Subjective: Patient reports doing well, feels ready to go home. Reports pain is well-controlled with current regimen. Ambulating, voiding,  and tolerating PO without issue. No BM since delivery, +flatus. Pertinent negatives on ROS include no headache, no blurry vision, no dyspnea, no epigastric or RUQ pain. Reports moderate amount of mildly bloody lochia.  Physical exam  Filed Vitals:   05/22/15 0825 05/22/15 1220 05/22/15 1809 05/23/15 0626  BP: 108/61 119/82 125/90 128/81  Pulse: 61 62 58 78  Temp:  98.3 F (36.8 C) 98.2 F (36.8 C) 98.5 F (36.9 C)  TempSrc:  Oral Oral Oral  Resp: Height:      Weight:      SpO2:       General: Alert, well-appearing resting in hospital bed, holding baby. NAD. Pulm: CTAB on frontal field exam CV: RRR, S1 and S2 heard, no M/R/G noted. Abdomen: NTTP without guarding. No epigastric or RUQ tenderness. Uterine Fundus: Firm below umbilicus Extr: No LE edema, no calf tenderness.  Discharge instruction: per After Visit Summary and "Baby and Me Booklet". -- Pelvic rest for 4 weeks or until ready -- Continue pre-natal vitamin -- outpatient f/u  in 6 weeks -- Use Ibuprofen for pain  Medications:    Medication List    STOP taking these medications        zolpidem 5 MG tablet  Commonly known as:  AMBIEN      TAKE these medications        acetaminophen 325 MG tablet  Commonly known as:  TYLENOL  Take 650 mg by mouth every 6 (six) hours as needed for moderate pain.     calcium carbonate 500 MG chewable tablet  Commonly known as:  TUMS - dosed in mg elemental calcium  Chew 2 tablets by mouth as needed for indigestion or heartburn.     cyclobenzaprine 10 MG tablet  Commonly known as:  FLEXERIL  Take 0.5 tablets (5 mg total) by mouth 3 (three) times daily as needed for muscle spasms. Muscle spasms     ibuprofen 600 MG tablet  Commonly known as:  ADVIL,MOTRIN  Take 1 tablet (600 mg total) by mouth every 6 (six) hours.     PRENATAL VITAMINS PO  Take 1 tablet by mouth daily.        Diet: routine diet Activity: Advance as tolerated. Pelvic rest for 6 weeks.   Outpatient follow up: 6 weeks Postpartum contraception: Mirena or Nexplanon. Patient is planning to arrange for one of these to be placed at her 6 week post-partum visit. She was counseled regarding the risk for pregnancy before then.  Newborn Data: Live born female  Birth Weight: 8 lb 14.7 oz (4045 g) APGAR: 9, 9  Baby Feeding: Breast Disposition:home with mother   05/23/2015 Bonnie Sporeaitlin Sullivan, MD    OB FELLOW DISCHARGE ATTESTATION  I have seen and examined this patient and agree with above documentation in the resident's note.   Silvano BilisNoah B Garnett Rekowski, MD 10:12 AM

## 2015-05-23 NOTE — Lactation Note (Signed)
This note was copied from the chart of Bonnie White Norman. Lactation Consultation Note  Patient Name: Bonnie White Ringle ZOXWR'UToday's Date: 05/23/2015 Reason for consult: Follow-up assessment Baby 36 hours old. Mom nursing baby when this LC entered room. Baby in cross-cradle position, latched deeply with lip flanged and suckling rhythmically with a few swallows noted. Mom reports nursing is going well and she has no complaints or questions. Enc mom to continue to nurse with cues. Mom aware of OP/BFSG and LC phone line assistance after D/C.   Maternal Data    Feeding    LATCH Score/Interventions                      Lactation Tools Discussed/Used     Consult Status Consult Status: PRN    Geralynn OchsWILLIARD, Vaudine Dutan 05/23/2015, 9:50 AM

## 2015-07-02 ENCOUNTER — Ambulatory Visit (INDEPENDENT_AMBULATORY_CARE_PROVIDER_SITE_OTHER): Admitting: Advanced Practice Midwife

## 2015-07-02 ENCOUNTER — Encounter: Payer: Self-pay | Admitting: Advanced Practice Midwife

## 2015-07-02 DIAGNOSIS — Z01812 Encounter for preprocedural laboratory examination: Secondary | ICD-10-CM

## 2015-07-02 LAB — POCT URINE PREGNANCY: PREG TEST UR: NEGATIVE

## 2015-07-02 NOTE — Progress Notes (Signed)
Patient ID: Bonnie White, female   DOB: 01/12/1986, 29 y.o.   MRN: [id number] Post Partum Exam  Bonnie Royaltyllen C Runner is a 29 y.o. 212P2002 female who presents for a postpartum visit. She is 6 weeks postpartum following a spontaneous vaginal delivery. I have fully reviewed the prenatal and intrapartum course. The delivery was at 46102w3d gestational weeks.  Anesthesia: epidural. Postpartum course has been unremarkable. Baby's course has been fussy at night, skin tag on right eye removed. Baby is feeding by both breast and bottle - Similac Sensitive RS. Bleeding normal. Bowel function is normal. Bladder function is normal. Patient is sexually active. Contraception method is none but desires Nexplanon. Postpartum depression screening: negative. Has resumed IC ~1 1/5 weeks ago. Did not use BC. Patient's last menstrual period was 06/29/2015.  The following portions of the patient's history were reviewed and updated as appropriate: allergies, current medications, past family history, past medical history, past social history, past surgical history and problem list.  Review of Systems Pertinent items are noted in HPI.   Objective:    BP 116/78 mmHg  Pulse 78  Resp 16  Ht 5\' 5"  (1.651 m)  Wt 211 lb (95.709 kg)  BMI 35.11 kg/m2  Breastfeeding? Yes  General:  alert and no distress   Breasts:  inspection negative, no nipple discharge or bleeding, no masses or nodularity palpable  Lungs: clear to auscultation bilaterally  Heart:  regular rate and rhythm, S1, S2 normal, no murmur, click, rub or gallop  Abdomen: soft, non-tender; bowel sounds normal; no masses,  no organomegaly   Vulva: Declined exam        Assessment:    Normal postpartum exam. Pap smear not done at today's visit.   Plan:    1. Contraception: Nexplanon--Unable to [facility] today due to unprotected IC less than 2 weeks ago.   2. Abstain and return for Nexplanon. 3. Follow up in: 1 weeks or as needed.

## 2015-07-02 NOTE — Patient Instructions (Signed)
Nexplanon handout given.

## 2015-07-09 ENCOUNTER — Encounter: Payer: Self-pay | Admitting: Family

## 2015-07-09 ENCOUNTER — Ambulatory Visit (INDEPENDENT_AMBULATORY_CARE_PROVIDER_SITE_OTHER): Admitting: Family

## 2015-07-09 VITALS — BP 116/70 | HR 82 | Resp 16 | Ht 65.0 in | Wt 155.0 lb

## 2015-07-09 DIAGNOSIS — Z01812 Encounter for preprocedural laboratory examination: Secondary | ICD-10-CM

## 2015-07-09 DIAGNOSIS — Z30017 Encounter for initial prescription of implantable subdermal contraceptive: Secondary | ICD-10-CM | POA: Diagnosis not present

## 2015-07-09 LAB — POCT URINE PREGNANCY: PREG TEST UR: NEGATIVE

## 2015-07-09 MED ORDER — ETONOGESTREL 68 MG ~~LOC~~ IMPL
68.0000 mg | DRUG_IMPLANT | Freq: Once | SUBCUTANEOUS | Status: AC
  Administered 2015-07-09: 68 mg via SUBCUTANEOUS

## 2015-07-09 NOTE — Progress Notes (Signed)
  HPI:  Bonnie White is a 29 y.o. year old Caucasian female here for Nexplanon insertion.  Here postpartum and her pregnancy test today was negative.  Risks/benefits/side effects of Nexplanon have been discussed and her questions have been answered.  Specifically, a failure rate of 08/998 has been reported, with an increased failure rate if pt takes St. John's Wort and/or antiseizure medicaitons.  Bonnie White is aware of the common side effect of irregular bleeding, which the incidence of decreases over time.   Past Medical History: Past Medical History  Diagnosis Date  . Medical history non-contributory     Past Surgical History: Past Surgical History  Procedure Laterality Date  . Wisdom tooth extraction    . No past surgeries      Family History: Family History  Problem Relation Age of Onset  . Ulcerative colitis Father   . Seizures Sister     Social History: Social History  Substance Use Topics  . Smoking status: Former Smoker -- 8.00 packs/day for 1 years    Types: Cigarettes  . Smokeless tobacco: Never Used  . Alcohol Use: No    Allergies: No Known Allergies    Her left arm, approximatly 4 inches proximal from the elbow, was cleansed with alcohol and anesthetized with 2cc of 2% Lidocaine.  The area was cleansed again and the Nexplanon was inserted without difficulty.  A pressure bandage was applied.  Pt was instructed to remove pressure bandage in 24 hours, and keep insertion site covered with a bandaid for 3 days.  Back up contraception was recommended for 7.  Follow-up scheduled PRN problems  Rochele PagesKARIM, Kyshaun Barnette N 07/09/2015 11:09 AM

## 2015-07-09 NOTE — Addendum Note (Signed)
Addended by: Granville LewisLARK, Wyeth Hoffer L on: 07/09/2015 11:55 AM   Modules accepted: Orders

## 2015-11-01 ENCOUNTER — Ambulatory Visit (INDEPENDENT_AMBULATORY_CARE_PROVIDER_SITE_OTHER): Admitting: Obstetrics & Gynecology

## 2015-11-01 DIAGNOSIS — R635 Abnormal weight gain: Secondary | ICD-10-CM | POA: Diagnosis not present

## 2015-11-01 DIAGNOSIS — Z3046 Encounter for surveillance of implantable subdermal contraceptive: Secondary | ICD-10-CM

## 2015-11-01 DIAGNOSIS — F489 Nonpsychotic mental disorder, unspecified: Secondary | ICD-10-CM

## 2015-11-01 DIAGNOSIS — R4589 Other symptoms and signs involving emotional state: Secondary | ICD-10-CM

## 2015-11-01 NOTE — Progress Notes (Signed)
   Subjective:    Patient ID: Bonnie White, female    DOB: 1986-04-27, 30 y.o.   MRN: [id number]  HPI 30 yo MW P221 (535 1/2 month old son) here today to have her Nexplanon removed. She has had it in about 3/1/2 months. She is having weight gain and moodiness. These are not her usual state. She plans to use rhythm method/withdrawal.   Review of Systems     Objective:   Physical Exam [id number] Breathing, conversing, and ambulating normally Consent was signed and time out was done. Her right arm was prepped with betadine after establishing the position of the Nexplanon. The area was infiltrated with 2 cc of 1% lidocaine. A small incision was made and the intact rod was easily removed. A steristrip was placed and her arm was noted to be hemostatic. It was bandaged.  She tolerated the procedure well.    Assessment & Plan:  Weight gain- check TSH Unhappiness with Nexplanon- She will use rhythm/withdrawal for contraception

## 2015-11-02 LAB — TSH: TSH: 1.21 mIU/L

## 2016-05-23 ENCOUNTER — Emergency Department (INDEPENDENT_AMBULATORY_CARE_PROVIDER_SITE_OTHER)
Admission: EM | Admit: 2016-05-23 | Discharge: 2016-05-23 | Disposition: A | Discharge status: Home / Self Care | Source: Home / Self Care | Attending: Family Medicine | Admitting: Family Medicine

## 2016-05-23 ENCOUNTER — Encounter: Payer: Self-pay | Admitting: Emergency Medicine

## 2016-05-23 DIAGNOSIS — N61 Mastitis without abscess: Secondary | ICD-10-CM | POA: Diagnosis not present

## 2016-05-23 MED ORDER — CLINDAMYCIN HCL 300 MG PO CAPS: 300.0000 mg | ORAL_CAPSULE | Freq: Three times a day (TID) | ORAL | 0 refills | Status: DC

## 2016-05-23 NOTE — ED Provider Notes (Signed)
[facility]    CSN: [id number] Arrival date & time: 05/23/16  1638     History   Chief Complaint Chief Complaint  Patient presents with  . Breast Pain    HPI Bonnie White is a 30 y.o. female.   Patient has been breast feeding for 12 months.  Last night she developed soreness in her left breast which has increased today.  She has had no response to ice packs and ibuprofen.  No fevers, chills, and sweats.   The history is provided by the patient.    Past Medical History:  Diagnosis Date  . Medical history non-contributory     There are no active problems to display for this patient.   Past Surgical History:  Procedure Laterality Date  . NO PAST SURGERIES    . WISDOM TOOTH EXTRACTION      OB History    Gravida Para Term Preterm AB Living   2 2 2     2    SAB TAB Ectopic Multiple Live Births         0 2       Home Medications    Prior to Admission medications   Medication Sig Start Date End Date Taking? Authorizing Provider  acetaminophen (TYLENOL) 325 MG tablet Take 650 mg by mouth every 6 (six) hours as needed for moderate pain.    Historical Provider, MD  clindamycin (CLEOCIN) 300 MG capsule Take 1 capsule (300 mg total) by mouth 3 (three) times daily. (every 8 hours) 05/23/16   Lattie HawStephen A Beese, MD  Prenatal Multivit-Min-Fe-FA (PRENATAL VITAMINS PO) Take 1 tablet by mouth daily.     Historical Provider, MD    Family History Family History  Problem Relation Age of Onset  . Ulcerative colitis Father   . Seizures Sister     Social History Social History  Substance Use Topics  . Smoking status: Former Smoker    Packs/day: 8.00    Years: 1.00    Types: Cigarettes  . Smokeless tobacco: Never Used  . Alcohol use No     Allergies   Review of patient's allergies indicates no known allergies.   Review of Systems Review of Systems  Constitutional: Negative for activity change, appetite change, chills, diaphoresis, fatigue and  fever.  HENT: Negative.   Eyes: Negative.   Respiratory: Negative.   Cardiovascular: Negative.   Gastrointestinal: Negative.   Genitourinary: Negative.   Musculoskeletal: Negative.   Skin: Positive for color change.  All other systems reviewed and are negative.    Physical Exam Triage Vital Signs ED Triage Vitals  Enc Vitals Group     BP 05/23/16 1719 121/78     Pulse Rate 05/23/16 1719 84     Resp 05/23/16 1719 16     Temp 05/23/16 1719 97.8 F (36.6 C)     Temp Source 05/23/16 1719 Oral     SpO2 05/23/16 1719 100 %     Weight 05/23/16 1720 165 lb (74.8 kg)     Height 05/23/16 1720 5\' 4"  (1.626 m)     Head Circumference --      Peak Flow --      Pain Score 05/23/16 1722 3     Pain Loc --      Pain Edu? --      Excl. in GC? --    No data found.   Updated Vital Signs BP 121/78 (BP Location: Left Arm)   Pulse 84   Temp 97.8 F (  36.6 C) (Oral)   Resp 16   Ht 5\' 4"  (1.626 m)   Wt 165 lb (74.8 kg)   LMP 05/04/2016 (Exact Date)   SpO2 100%   Breastfeeding? Yes   BMI 28.32 kg/m   Visual Acuity Right Eye Distance:   Left Eye Distance:   Bilateral Distance:    Right Eye Near:   Left Eye Near:    Bilateral Near:     Physical Exam  Constitutional: She appears well-developed and well-nourished. No distress.  HENT:  Head: Normocephalic.  Nose: Nose normal.  Mouth/Throat: Oropharynx is clear and moist.  Eyes: Conjunctivae are normal. Pupils are equal, round, and reactive to light.  Neck: Neck supple.  Cardiovascular: Normal heart sounds.   Pulmonary/Chest: Breath sounds normal. Right breast exhibits skin change and tenderness. Breasts are symmetrical. There is breast swelling.    Chaperoned exam: Left breast has tenderness and mild erythema over inferior outer quadrant as noted on diagram.   Abdominal: There is no tenderness.  Genitourinary: There is breast tenderness.  Lymphadenopathy:    She has no cervical adenopathy.  Neurological: She is alert.    Skin: Skin is warm and dry.  Nursing note and vitals reviewed.    UC Treatments / Results  Labs (all labs ordered are listed, but only abnormal results are displayed) Labs Reviewed  WOUND CULTURE    EKG  EKG Interpretation None       Radiology No results found.  Procedures Procedures (including critical care time)  Medications Ordered in UC Medications - No data to display   Initial Impression / Assessment and Plan / UC Course  I have reviewed the triage vital signs and the nursing notes.  Pertinent labs & imaging results that were available during my care of the patient were reviewed by me and considered in my medical decision making (see chart for details).  Clinical Course  Culture of breast milk left breast pending.  Begin Clindamycin 300mg  TID May take Ibuprofen or Tylenol as needed for pain. If symptoms become significantly worse during the night or over the weekend, proceed to the local emergency room. Followup with Family Doctor if not improved in about 5 days.     Final Clinical Impressions(s) / UC Diagnoses   Final diagnoses:  Acute mastitis of left breast    New Prescriptions New Prescriptions   CLINDAMYCIN (CLEOCIN) 300 MG CAPSULE    Take 1 capsule (300 mg total) by mouth 3 (three) times daily. (every 8 hours)     Lattie Haw, MD 05/29/16 [phone]

## 2016-05-23 NOTE — ED Triage Notes (Signed)
Patient is breast feeding x 12 months; during night developed awareness of pain in right breast; no breast pump at home; tried ice packs and ibuprofen with last dose at 1100.

## 2016-05-23 NOTE — Discharge Instructions (Signed)
May take Ibuprofen or Tylenol as needed for pain. If symptoms become significantly worse during the night or over the weekend, proceed to the local emergency room.

## 2016-05-26 ENCOUNTER — Telehealth: Payer: Self-pay | Admitting: *Deleted

## 2016-05-26 LAB — WOUND CULTURE
GRAM STAIN: NONE SEEN
Gram Stain: NONE SEEN
Gram Stain: NONE SEEN
Organism ID, Bacteria: NORMAL

## 2016-05-26 NOTE — Telephone Encounter (Signed)
Callback: Patient reports she is much improved. WCX results given.

## 2016-09-16 ENCOUNTER — Encounter: Payer: Self-pay | Admitting: Obstetrics & Gynecology

## 2016-09-16 ENCOUNTER — Ambulatory Visit (INDEPENDENT_AMBULATORY_CARE_PROVIDER_SITE_OTHER): Admitting: Obstetrics & Gynecology

## 2016-09-16 VITALS — BP 125/78 | HR 62 | Wt 171.0 lb

## 2016-09-16 DIAGNOSIS — Z113 Encounter for screening for infections with a predominantly sexual mode of transmission: Secondary | ICD-10-CM

## 2016-09-16 DIAGNOSIS — N898 Other specified noninflammatory disorders of vagina: Secondary | ICD-10-CM

## 2016-09-16 DIAGNOSIS — Z3481 Encounter for supervision of other normal pregnancy, first trimester: Secondary | ICD-10-CM | POA: Diagnosis not present

## 2016-09-16 DIAGNOSIS — Z3689 Encounter for other specified antenatal screening: Secondary | ICD-10-CM

## 2016-09-16 DIAGNOSIS — O9989 Other specified diseases and conditions complicating pregnancy, childbirth and the puerperium: Secondary | ICD-10-CM

## 2016-09-16 DIAGNOSIS — Z3490 Encounter for supervision of normal pregnancy, unspecified, unspecified trimester: Secondary | ICD-10-CM | POA: Insufficient documentation

## 2016-09-16 MED ORDER — DOXYLAMINE-PYRIDOXINE 10-10 MG PO TBEC: 2.0000 | DELAYED_RELEASE_TABLET | Freq: Every day | ORAL | 2 refills | Status: DC

## 2016-09-16 NOTE — Progress Notes (Signed)
  Subjective:    Bonnie White is a [id number] 5138w6d being seen today for her first obstetrical visit.  Her obstetrical history is significant for preterm contractions with 2nd pregnancy; induced at 41 weeks.. Patient does intend to breast feed. Pregnancy history fully reviewed.  Patient reports nausea and vomiting.  Vitals:   09/16/16 1421  BP: 125/78  Pulse: 62  Weight: 171 lb (77.6 kg)    HISTORY: OB History  Gravida Para Term Preterm AB Living  3 2 2     2   SAB TAB Ectopic Multiple Live Births        0 2    # Outcome Date GA Lbr Len/2nd Weight Sex Delivery Anes PTL Lv  3 Current           2 Term 05/21/15 1567w3d 06:50 / 00:14 8 lb 14.7 oz (4.045 kg) M Vag-Spont EPI  LIV     Birth Comments: none  1 Term 06/26/08    F Vag-Spont  Y LIV     Past Medical History:  Diagnosis Date  . Medical history non-contributory    Past Surgical History:  Procedure Laterality Date  . NO PAST SURGERIES    . WISDOM TOOTH EXTRACTION     Family History  Problem Relation Age of Onset  . Ulcerative colitis Father   . Seizures Sister      Exam    Uterus:     Pelvic Exam:    Perineum: No Hemorrhoids   Vulva: normal   Vagina:  curdlike discharge   pH: n/a   Cervix: no cervical motion tenderness   Adnexa: normal adnexa   Bony Pelvis: average  System: Breast:  normal appearance, no masses or tenderness   Skin: normal coloration and turgor, no rashes    Neurologic: oriented, normal mood   Extremities: no deformities   HEENT extra ocular movement intact, sclera clear, anicteric, oropharynx clear, no lesions, neck supple with midline trachea, thyroid without masses and trachea midline   Mouth/Teeth mucous membranes moist, pharynx normal without lesions and dental hygiene good   Neck supple and no masses   Cardiovascular: regular rate and rhythm   Respiratory:  appears well, vitals normal, no respiratory distress, acyanotic, normal RR, chest clear, no wheezing, crepitations, rhonchi,  normal symmetric air entry   Abdomen: soft, non-tender; bowel sounds normal; no masses,  no organomegaly   Urinary: urethral meatus normal      Assessment:    Pregnancy: A5W0981G3P2002 Patient Active Problem List   Diagnosis Date Noted  . Supervision of normal pregnancy 09/16/2016        Plan:     Initial labs drawn. Prenatal vitamins. Problem list reviewed and updated. Genetic Screening discussed First Screen: ordered.  Ultrasound discussed; fetal survey: requested.  Follow up in 5 weeks.  1-Babyscripts reduced visit module 2-AFP at 18-20 weeks 3-Anatomy US 18-20 weeks 4-Diclegis for nausea 5-Nutriton referral for obesity and 12 pound weight gain in pregnancy.  Elsie LincolnKelly Manuelito Poage 09/16/2016

## 2016-09-16 NOTE — Progress Notes (Signed)
Bedside U/S shows IUP with CRL of 13.578mm  GA is 205w6d  FHT is 167 BPM

## 2016-09-17 LAB — PAIN MGMT, PROFILE 6 CONF W/O MM, U
6 Acetylmorphine: NEGATIVE ng/mL (ref <10)
AMPHETAMINES: NEGATIVE ng/mL (ref <500)
Alcohol Metabolites: NEGATIVE ng/mL (ref <500)
BARBITURATES: NEGATIVE ng/mL (ref <300)
Benzodiazepines: NEGATIVE ng/mL (ref <100)
COCAINE METABOLITE: NEGATIVE ng/mL (ref <150)
Creatinine: 18.6 mg/dL — ABNORMAL LOW (ref >=20.0)
METHADONE METABOLITE: NEGATIVE ng/mL (ref <100)
Marijuana Metabolite: NEGATIVE ng/mL (ref <20)
OXYCODONE: NEGATIVE ng/mL (ref <100)
Opiates: NEGATIVE ng/mL (ref <100)
Oxidant: NEGATIVE ug/mL (ref <200)
PHENCYCLIDINE: NEGATIVE ng/mL (ref <25)
Please note:: 0
SPECIFIC GRAVITY: 1.004 (ref >=1.003)
pH: 6.98 (ref 4.5–9.0)

## 2016-09-17 LAB — PRENATAL PROFILE (SOLSTAS)
ANTIBODY SCREEN: NEGATIVE
BASOS PCT: 0 %
Basophils Absolute: 0 cells/uL (ref 0–200)
Eosinophils Absolute: 86 cells/uL (ref 15–500)
Eosinophils Relative: 1 %
HEMATOCRIT: 36.9 % (ref 35.0–45.0)
HIV 1&2 Ab, 4th Generation: NONREACTIVE
Hemoglobin: 12.3 g/dL (ref 11.7–15.5)
Hepatitis B Surface Ag: NEGATIVE
LYMPHS PCT: 29 %
Lymphs Abs: 2494 cells/uL (ref 850–3900)
MCH: 30.9 pg (ref 27.0–33.0)
MCHC: 33.3 g/dL (ref 32.0–36.0)
MCV: 92.7 fL (ref 80.0–100.0)
MONOS PCT: 5 %
MPV: 10.4 fL (ref 7.5–12.5)
Monocytes Absolute: 430 cells/uL (ref 200–950)
Neutro Abs: 5590 cells/uL (ref 1500–7800)
Neutrophils Relative %: 65 %
PLATELETS: 291 10*3/uL (ref 140–400)
RBC: 3.98 MIL/uL (ref 3.80–5.10)
RDW: 12.7 % (ref 11.0–15.0)
Rh Type: POSITIVE
Rubella: 2.26 Index — ABNORMAL HIGH (ref <0.90)
WBC: 8.6 10*3/uL (ref 3.8–10.8)

## 2016-09-17 LAB — HEMOGLOBIN A1C
Hgb A1c MFr Bld: 4.9 % (ref <5.7)
MEAN PLASMA GLUCOSE: 94 mg/dL

## 2016-09-17 LAB — GLUCOSE, RANDOM: Glucose, Bld: 88 mg/dL (ref 65–99)

## 2016-09-18 LAB — URINE CYTOLOGY ANCILLARY ONLY
CHLAMYDIA, DNA PROBE: NEGATIVE
Neisseria Gonorrhea: NEGATIVE

## 2016-09-18 LAB — CERVICOVAGINAL ANCILLARY ONLY: Candida vaginitis: POSITIVE — AB

## 2016-09-18 LAB — CULTURE, OB URINE
Colony Count: NO GROWTH
Organism ID, Bacteria: NO GROWTH

## 2016-10-14 ENCOUNTER — Ambulatory Visit (INDEPENDENT_AMBULATORY_CARE_PROVIDER_SITE_OTHER): Admitting: Obstetrics & Gynecology

## 2016-10-14 VITALS — BP 115/72 | HR 82 | Wt 166.0 lb

## 2016-10-14 DIAGNOSIS — Z3481 Encounter for supervision of other normal pregnancy, first trimester: Secondary | ICD-10-CM

## 2016-10-14 NOTE — Progress Notes (Signed)
Left lower back pain.

## 2016-10-14 NOTE — Progress Notes (Signed)
   PRENATAL VISIT NOTE  Subjective:  Bonnie White is a 31 y.o. G3P2002 at 644w6d being seen today for ongoing prenatal care.  She is currently monitored for the following issues for this low-risk pregnancy and has Supervision of normal pregnancy on her problem list.  Patient reports left SI pain. Nausea/vomitting resolving..   Bonnie White. Vag. Bleeding: None.   . Denies leaking of fluid.   The following portions of the patient's history were reviewed and updated as appropriate: allergies, current medications, past family history, past medical history, past social history, past surgical history and problem list. Problem list updated.  Objective:   Vitals:   10/14/16 1309  BP: 115/72  Pulse: 82  Weight: 166 lb (75.3 kg)    Fetal Status:           General:  Alert, oriented and cooperative. Patient is in no acute distress.  Skin: Skin is warm and dry. No rash noted.   Cardiovascular: Normal heart rate noted  Respiratory: Normal respiratory effort, no problems with respiration noted  Abdomen: Soft, gravid, appropriate for gestational age.       Pelvic:  Cervical exam deferred        Extremities: Normal range of motion.  Edema: None  Mental Status: Normal mood and affect. Normal behavior. Normal judgment and thought content.   Assessment and Plan:  Pregnancy: G3P2002 at 444w6d  1. Encounter for supervision of other normal pregnancy in first trimester - Schedule anatomy u/s at 19 weeks  Preterm labor symptoms and general obstetric precautions including but not limited to vaginal bleeding, contractions, leaking of fluid and fetal movement were reviewed in detail with the patient. Please refer to After Visit Summary for other counseling recommendations.  No Follow-up on file.   Bonnie BossierMyra C Helmer Dull, MD

## 2016-10-22 ENCOUNTER — Ambulatory Visit (HOSPITAL_COMMUNITY)
Admission: RE | Admit: 2016-10-22 | Discharge: 2016-10-22 | Disposition: A | Discharge status: Home / Self Care | Source: Ambulatory Visit | Attending: Obstetrics & Gynecology | Admitting: Obstetrics & Gynecology

## 2016-10-22 ENCOUNTER — Other Ambulatory Visit: Payer: Self-pay

## 2016-10-22 ENCOUNTER — Encounter (HOSPITAL_COMMUNITY): Payer: Self-pay

## 2016-10-22 DIAGNOSIS — Z3A13 13 weeks gestation of pregnancy: Secondary | ICD-10-CM | POA: Diagnosis not present

## 2016-10-22 DIAGNOSIS — Z3682 Encounter for antenatal screening for nuchal translucency: Secondary | ICD-10-CM | POA: Insufficient documentation

## 2016-10-22 DIAGNOSIS — Z3481 Encounter for supervision of other normal pregnancy, first trimester: Secondary | ICD-10-CM

## 2016-12-03 ENCOUNTER — Ambulatory Visit (HOSPITAL_COMMUNITY)
Admission: RE | Admit: 2016-12-03 | Discharge: 2016-12-03 | Disposition: A | Discharge status: Home / Self Care | Source: Ambulatory Visit | Attending: Obstetrics & Gynecology | Admitting: Obstetrics & Gynecology

## 2016-12-03 DIAGNOSIS — Z3482 Encounter for supervision of other normal pregnancy, second trimester: Secondary | ICD-10-CM | POA: Diagnosis present

## 2016-12-03 DIAGNOSIS — Z3A19 19 weeks gestation of pregnancy: Secondary | ICD-10-CM | POA: Insufficient documentation

## 2016-12-03 DIAGNOSIS — Z3689 Encounter for other specified antenatal screening: Secondary | ICD-10-CM | POA: Insufficient documentation

## 2016-12-03 DIAGNOSIS — Z3481 Encounter for supervision of other normal pregnancy, first trimester: Secondary | ICD-10-CM

## 2016-12-10 ENCOUNTER — Ambulatory Visit (INDEPENDENT_AMBULATORY_CARE_PROVIDER_SITE_OTHER): Admitting: Obstetrics & Gynecology

## 2016-12-10 VITALS — BP 127/78 | HR 96 | Wt 168.0 lb

## 2016-12-10 DIAGNOSIS — Z3400 Encounter for supervision of normal first pregnancy, unspecified trimester: Secondary | ICD-10-CM

## 2016-12-10 DIAGNOSIS — Z3482 Encounter for supervision of other normal pregnancy, second trimester: Secondary | ICD-10-CM | POA: Diagnosis not present

## 2016-12-10 NOTE — Progress Notes (Signed)
PRENATAL VISIT NOTE  Subjective:  Bonnie White is a 31 y.o. G3P2002 at [redacted]w[redacted]d being seen today for ongoing prenatal care.  She is currently monitored for the following issues for this low-risk pregnancy and has Supervision of normal pregnancy on her problem list.  Patient reports no complaints.   . Vag. Bleeding: None.  Movement: Present. Denies leaking of fluid.   The following portions of the patient's history were reviewed and updated as appropriate: allergies, current medications, past family history, past medical history, past social history, past surgical history and problem list. Problem list updated.  Objective:   Vitals:   12/10/16 0849  BP: 127/78  Pulse: 96  Weight: 168 lb (76.2 kg)    Fetal Status: Fetal Heart Rate (bpm): 147 Fundal Height: 20 cm Movement: Present     General:  Alert, oriented and cooperative. Patient is in no acute distress.  Skin: Skin is warm and dry. No rash noted.   Cardiovascular: Normal heart rate noted  Respiratory: Normal respiratory effort, no problems with respiration noted  Abdomen: Soft, gravid, appropriate for gestational age. Pain/Pressure: Absent     Pelvic:  Cervical exam deferred        Extremities: Normal range of motion.  Edema: None  Mental Status: Normal mood and affect. Normal behavior. Normal judgment and thought content.   [place] Mfm Ob Comp + 14 Wk  Result Date: 12/03/2016 ----------------------------------------------------------------------  OBSTETRICS REPORT                      (Signed Final 12/03/2016 11:12 pm) ---------------------------------------------------------------------- Patient Info  ID #:       [id number]                         D.O.B.:   November 07, 1985 (31 yrs)  Name:       Bonnie White               Visit Date:  12/03/2016 09:47 am ---------------------------------------------------------------------- Performed By  Performed By:     Eden Lathe BS      Ref. Address:     [address]                    [facility]  R[facility]                                                             [address]                                                             [place], [place]                                                             [address]  Attending:        Particia Nearing MD       [facility]  Referred By:      Lesly DukesKELLY H                    LEGGETT MD ---------------------------------------------------------------------- Orders   #  Description                                 Code   1  US MFM OB COMP + 14 WK                      76805.01  ----------------------------------------------------------------------   #  Ordered By               Order #        Accession #    Episode #   1  Nicholaus BloomMYRA DOVE                [id number]      [id number]     [id number]  ---------------------------------------------------------------------- Indications   [redacted] weeks gestation of pregnancy                Z3A.19   Encounter for fetal anatomic survey            Z36.89  ---------------------------------------------------------------------- OB History  Blood Type:            Height:  5'4"   Weight (lb):  167      BMI:   28.66  Gravidity:    3         Term:   2        Prem:   0        SAB:   0  TOP:          0       Ectopic:  0        Living: 2 ---------------------------------------------------------------------- Fetal Evaluation  Num Of Fetuses:     1  Fetal Heart         153  Rate(bpm):  Cardiac Activity:   Observed  Presentation:       Transverse, head to maternal left  Placenta:           Posterior, above cervical os  P. Cord Insertion:  Visualized, central  Amniotic Fluid  AFI FV:      Subjectively within normal limits                              Largest Pocket(cm)                              4.41 ---------------------------------------------------------------------- Biometry  BPD:      41.9  mm     G. Age:  18w 5d         37  %    CI:        69.53   %   70 - 86                                                          FL/HC:      16.9   %   16.1 -  18.3  HC:  160.4  mm     G. Age:  18w 6d         36  %    HC/AC:      1.10       1.09 - 1.39  AC:      145.6  mm     G. Age:  19w 6d         74  %    FL/BPD:     64.7   %  FL:       27.1  mm     G. Age:  18w 2d         20  %    FL/AC:      18.6   %   20 - 24  HUM:      28.8  mm     G. Age:  19w 2d         59  %  CER:      18.8  mm     G. Age:  18w 3d         32  %  NFT:       2.8  mm  CM:        5.1  mm  Est. FW:     273  gm    0 lb 10 oz      46  % ---------------------------------------------------------------------- Gestational Age  LMP:           22w 1d       Date:   07/01/16                 EDD:   04/07/17  U/S Today:     19w 0d                                        EDD:   04/29/17  Best:          19w 0d    Det. By:   Marcella Dubs         EDD:   04/29/17                                      (09/16/16) ---------------------------------------------------------------------- Anatomy  Cranium:               Appears normal         Aortic Arch:            Appears normal  Cavum:                 Appears normal         Ductal Arch:            Appears normal  Ventricles:            Appears normal         Diaphragm:              Appears normal  Choroid Plexus:        Appears normal         Stomach:                Appears normal, left  sided  Cerebellum:            Appears normal         Abdomen:                Appears normal  Posterior Fossa:       Appears normal         Abdominal Wall:         Appears nml (cord                                                                        insert, abd wall)  Nuchal Fold:           Appears normal         Cord Vessels:           Appears normal (3                                                                        vessel cord)  Face:                  Appears normal         Kidneys:                Appear normal                         (orbits and profile)  Lips:                  Appears normal          Bladder:                Appears normal  Thoracic:              Appears normal         Spine:                  Appears normal  Heart:                 Appears normal         Upper Extremities:      Appears normal                         (4CH, axis, and situs  RVOT:                  Appears normal         Lower Extremities:      Appears normal  LVOT:                  Appears normal  Other:  Fetus appears to be a female. Heels and 5th digit visualized. Open          hands visualized. Nasal bone visualized. ---------------------------------------------------------------------- Cervix Uterus Adnexa  Cervix  Length:            4.7  cm.  Normal appearance by transabdominal scan.  Uterus  No abnormality visualized.  Left Ovary  Within normal limits.  Right Ovary  Within normal limits.  Cul De Sac:   No free fluid seen.  Adnexa:       No abnormality visualized. ---------------------------------------------------------------------- Impression  SIUP at 19+0 weeks  Normal detailed fetal anatomy  Markers of aneuploidy: none  Normal amniotic fluid volume  Measurements consistent with early [place] ---------------------------------------------------------------------- Recommendations  Follow-up as clinically indicated ----------------------------------------------------------------------                 Particia Nearing, MD Electronically Signed Final Report   12/03/2016 11:12 pm ----------------------------------------------------------------------   Assessment and Plan:  Pregnancy: Z6X0960 at [redacted]w[redacted]d  1. Supervision of normal first pregnancy, antepartum Normal first trimester screen and anatomy scan. AFP only screen today. No other issues. Excited about having a girl! - Alpha fetoprotein, maternal Preterm labor symptoms and general obstetric precautions including but not limited to vaginal bleeding, contractions, leaking of fluid and fetal movement were reviewed in detail with the patient. Please refer to After Visit Summary for  other counseling recommendations.  Return in about 4 weeks (around 01/07/2017) for OB visit.   Tereso Newcomer, MD

## 2016-12-10 NOTE — Patient Instructions (Signed)

## 2016-12-12 LAB — ALPHA FETOPROTEIN, MATERNAL
AFP: 58.5 ng/mL
Curr Gest Age: 20 weeks
MoM for AFP: 1.07
Open Spina bifida: NEGATIVE
Osb Risk: 1:10600 {titer}

## 2017-01-07 ENCOUNTER — Ambulatory Visit (INDEPENDENT_AMBULATORY_CARE_PROVIDER_SITE_OTHER): Admitting: Obstetrics & Gynecology

## 2017-01-07 VITALS — BP 107/67 | HR 87 | Wt 169.0 lb

## 2017-01-07 DIAGNOSIS — Z3482 Encounter for supervision of other normal pregnancy, second trimester: Secondary | ICD-10-CM | POA: Diagnosis not present

## 2017-01-07 DIAGNOSIS — N898 Other specified noninflammatory disorders of vagina: Secondary | ICD-10-CM

## 2017-01-07 NOTE — Addendum Note (Signed)
Addended by: Granville LewisLARK, Yousof Alderman L on: 01/07/2017 10:07 AM   Modules accepted: Orders

## 2017-01-07 NOTE — Progress Notes (Signed)
Having yellow greenish vaginal discharge. BABYSCRIPTS PATIENT: [x ] initial, [x ] 12, [x ] 20, [ ]  28, [ ]  32, [ ]  36, [ ]  38, [ ]  39, [ ]  40

## 2017-01-07 NOTE — Progress Notes (Signed)
   PRENATAL VISIT NOTE  Subjective:  Bonnie White is a 31 y.o. G3P2002 at 5958w0d being seen today for ongoing prenatal care.  She is currently monitored for the following issues for this low-risk pregnancy and has Supervision of normal pregnancy on her problem list.  Patient reports green chunky vaginal d/c. Has self treated with Monistat multile times but returns..   [name]. Vag. Bleeding: None.  Movement: Present. Denies leaking of fluid.   The following portions of the patient's history were reviewed and updated as appropriate: allergies, current medications, past family history, past medical history, past social history, past surgical history and problem list. Problem list updated.  Objective:   Vitals:   01/07/17 0917  BP: 107/67  Pulse: 87  Weight: 169 lb (76.7 kg)    Fetal Status: Fetal Heart Rate (bpm): 153   Movement: Present     General:  Alert, oriented and cooperative. Patient is in no acute distress.  Skin: Skin is warm and dry. No rash noted.   Cardiovascular: Normal heart rate noted  Respiratory: Normal respiratory effort, no problems with respiration noted  Abdomen: Soft, gravid, appropriate for gestational age. Pain/Pressure: Absent     Pelvic:  Cervical exam deferred      Curd like discharge adherent to vaginal walls.  Extremities: Normal range of motion.  Edema: None  Mental Status: Normal mood and affect. Normal behavior. Normal judgment and thought content.   Assessment and Plan:  Pregnancy: G3P2002 at 8058w0d  1. Encounter for supervision of other normal pregnancy in second trimester Pt has not scheduled for the reduced visit track even though she is compliant on baby scripts.  Wil note chanrt and have patient come at 30 then 36 weeks as long as she remains compliant and BP nml.    BD affirm for vaginitis.  Preterm labor symptoms and general obstetric precautions including but not limited to vaginal bleeding, contractions, leaking of fluid and fetal movement  were reviewed in detail with the patient. Please refer to After Visit Summary for other counseling recommendations.  Return in about 6 weeks (around 02/18/2017).   Elsie LincolnKelly Antuan Limes, MD

## 2017-01-08 LAB — CERVICOVAGINAL ANCILLARY ONLY
BACTERIAL VAGINITIS: NEGATIVE
Candida vaginitis: POSITIVE — AB

## 2017-01-13 ENCOUNTER — Telehealth: Payer: Self-pay | Admitting: *Deleted

## 2017-01-13 DIAGNOSIS — B3731 Acute candidiasis of vulva and vagina: Secondary | ICD-10-CM

## 2017-01-13 DIAGNOSIS — B373 Candidiasis of vulva and vagina: Secondary | ICD-10-CM

## 2017-01-13 MED ORDER — TERCONAZOLE 0.8 % VA CREA: 1.0000 | TOPICAL_CREAM | Freq: Every day | VAGINAL | 0 refills | Status: DC

## 2017-01-13 NOTE — Telephone Encounter (Signed)
-----   Message from Kelly H Leggett, MD sent at 01/08/2017  3:20 PM EDT ----- Pt has tried monistat multiple times and doesn't work.  Diflucan has been shown to increase the risk of miscarriage before 22 weeks.  Data not clear on after 22 weeks.  Pt can try Diflcuan with small risk of possible causing preterm labor.  She can try terazol 3 which can have a possible risk of delayed skeletal ossification based on limited animal data at 30x systemic exposure. 

## 2017-01-13 NOTE — Telephone Encounter (Signed)
Pt notified of positive test results of Candida.  SHe was given the opts and the risk of either Diflucan and Terazol.  Pt opted for Terazol 3 and [facility] Penne LashLeggett.

## 2017-01-13 NOTE — Telephone Encounter (Signed)
-----   Message from Lesly DukesKelly H Leggett, MD sent at 01/08/2017  3:20 PM EDT ----- Pt has tried monistat multiple times and doesn't work.  Diflucan has been shown to increase the risk of miscarriage before 22 weeks.  Data not clear on after 22 weeks.  Pt can try Diflcuan with small risk of possible causing preterm labor.  She can try terazol 3 which can have a possible risk of delayed skeletal ossification based on limited animal data at 30x systemic exposure.

## 2017-02-18 ENCOUNTER — Ambulatory Visit (INDEPENDENT_AMBULATORY_CARE_PROVIDER_SITE_OTHER): Admitting: Obstetrics & Gynecology

## 2017-02-18 VITALS — BP 100/62 | HR 82 | Wt 175.0 lb

## 2017-02-18 DIAGNOSIS — Z23 Encounter for immunization: Secondary | ICD-10-CM | POA: Diagnosis not present

## 2017-02-18 DIAGNOSIS — Z3483 Encounter for supervision of other normal pregnancy, third trimester: Secondary | ICD-10-CM

## 2017-02-18 LAB — CBC
HEMATOCRIT: 33.2 % — AB (ref 35.0–45.0)
Hemoglobin: 11 g/dL — ABNORMAL LOW (ref 11.7–15.5)
MCH: 31.3 pg (ref 27.0–33.0)
MCHC: 33.1 g/dL (ref 32.0–36.0)
MCV: 94.6 fL (ref 80.0–100.0)
MPV: 10.1 fL (ref 7.5–12.5)
Platelets: 253 10*3/uL (ref 140–400)
RBC: 3.51 MIL/uL — AB (ref 3.80–5.10)
RDW: 13 % (ref 11.0–15.0)
WBC: 7.7 10*3/uL (ref 3.8–10.8)

## 2017-02-18 MED ORDER — HYDROCORTISONE 2.5 % RE CREA: 1.0000 "application " | TOPICAL_CREAM | Freq: Two times a day (BID) | RECTAL | 0 refills | Status: DC

## 2017-02-18 NOTE — Progress Notes (Signed)
   PRENATAL VISIT NOTE  Subjective:  Bonnie White is a 31 y.o. G3P2002 at 6350w0d being seen today for ongoing prenatal care.  She is currently monitored for the following issues for this low-risk pregnancy and has Supervision of normal pregnancy on her problem list.  Patient reports no complaints.   .  .   . Denies leaking of fluid.   The following portions of the patient's history were reviewed and updated as appropriate: allergies, current medications, past family history, past medical history, past social history, past surgical history and problem list. Problem list updated.  Objective:  There were no vitals filed for this visit.  Fetal Status:           General:  Alert, oriented and cooperative. Patient is in no acute distress.  Skin: Skin is warm and dry. No rash noted.   Cardiovascular: Normal heart rate noted  Respiratory: Normal respiratory effort, no problems with respiration noted  Abdomen: Soft, gravid, appropriate for gestational age.        Pelvic: Cervical exam deferred        Extremities: Normal range of motion.     Mental Status:  Normal mood and affect. Normal behavior. Normal judgment and thought content.   Assessment and Plan:  Pregnancy: G3P2002 at 3750w0d  1. Encounter for supervision of other normal pregnancy in third trimester - tdap - CBC - HIV antibody - Glucose Tolerance, 2 Hours w/1 Hour  Preterm labor symptoms and general obstetric precautions including but not limited to vaginal bleeding, contractions, leaking of fluid and fetal movement were reviewed in detail with the patient. Please refer to After Visit Summary for other counseling recommendations.  Return in about 4 weeks (around 03/18/2017).   Allie BossierMyra C Keymani Glynn, MD

## 2017-02-18 NOTE — Addendum Note (Signed)
Addended by: Allie BossierVE, Link Burgeson C on: 02/18/2017 09:54 AM   Modules accepted: Orders

## 2017-02-19 LAB — GLUCOSE TOLERANCE, 2 HOURS W/ 1HR
GLUCOSE, 2 HOUR: 143 mg/dL — AB (ref <140)
GLUCOSE: 156 mg/dL
Glucose, Fasting: 74 mg/dL (ref 65–99)

## 2017-02-19 LAB — HIV ANTIBODY (ROUTINE TESTING W REFLEX): HIV 1&2 Ab, 4th Generation: NONREACTIVE

## 2017-03-04 ENCOUNTER — Ambulatory Visit (INDEPENDENT_AMBULATORY_CARE_PROVIDER_SITE_OTHER): Admitting: Obstetrics & Gynecology

## 2017-03-04 DIAGNOSIS — O3663X0 Maternal care for excessive fetal growth, third trimester, not applicable or unspecified: Secondary | ICD-10-CM

## 2017-03-04 DIAGNOSIS — Z3403 Encounter for supervision of normal first pregnancy, third trimester: Secondary | ICD-10-CM

## 2017-03-04 MED ORDER — ONDANSETRON HCL 4 MG PO TABS: 4.0000 mg | ORAL_TABLET | Freq: Three times a day (TID) | ORAL | 0 refills | Status: DC | PRN

## 2017-03-04 NOTE — Progress Notes (Signed)
   PRENATAL VISIT NOTE  Subjective:  Bonnie White is a 31 y.o. G3P2002 at 6559w0d being seen today for ongoing prenatal care.  She is currently monitored for the following issues for this low-risk pregnancy and has Supervision of normal pregnancy on her problem list.  Patient reports nausea and vomiting.  Contractions: Irritability. Vag. Bleeding: None.  Movement: Present. Denies leaking of fluid.   The following portions of the patient's history were reviewed and updated as appropriate: allergies, current medications, past family history, past medical history, past social history, past surgical history and problem list. Problem list updated.  Objective:   Vitals:   03/04/17 0913  BP: 120/79  Pulse: 100  Weight: 177 lb (80.3 kg)    Fetal Status: Fetal Heart Rate (bpm): 156 Fundal Height: 36 cm Movement: Present     General:  Alert, oriented and cooperative. Patient is in no acute distress.  Skin: Skin is warm and dry. No rash noted.   Cardiovascular: Normal heart rate noted  Respiratory: Normal respiratory effort, no problems with respiration noted  Abdomen: Soft, gravid, appropriate for gestational age.  Pain/Pressure: Absent     Pelvic: Cervical exam deferred        Extremities: Normal range of motion.  Edema: None  Mental Status:  Normal mood and affect. Normal behavior. Normal judgment and thought content.   Assessment and Plan:  Pregnancy: G3P2002 at 6359w0d  1. Macrosomia Size greater than dates; history of 9 pound NSVD - US MFM OB FOLLOW UP; Future  2.  Zofran for N/V  3.  BRx--Pt scheduled at 30 and 32 instead of 30 and 34 weeks.  Pt compliant with BP and weight.  Will see at 36 weeks and continue taking measurements at home.    Preterm labor symptoms and general obstetric precautions including but not limited to vaginal bleeding, contractions, leaking of fluid and fetal movement were reviewed in detail with the patient. Please refer to After Visit Summary for other  counseling recommendations.  Return in about 4 weeks (around 04/01/2017).   Elsie LincolnKelly Ambrosia Wisnewski, MD

## 2017-03-09 ENCOUNTER — Ambulatory Visit (HOSPITAL_COMMUNITY)
Admission: RE | Admit: 2017-03-09 | Discharge: 2017-03-09 | Disposition: A | Discharge status: Home / Self Care | Source: Ambulatory Visit | Attending: Obstetrics & Gynecology | Admitting: Obstetrics & Gynecology

## 2017-03-09 ENCOUNTER — Other Ambulatory Visit: Payer: Self-pay | Admitting: Obstetrics & Gynecology

## 2017-03-09 DIAGNOSIS — O26843 Uterine size-date discrepancy, third trimester: Secondary | ICD-10-CM | POA: Diagnosis not present

## 2017-03-09 DIAGNOSIS — Z3A32 32 weeks gestation of pregnancy: Secondary | ICD-10-CM | POA: Insufficient documentation

## 2017-03-09 DIAGNOSIS — O403XX0 Polyhydramnios, third trimester, not applicable or unspecified: Secondary | ICD-10-CM | POA: Insufficient documentation

## 2017-03-10 ENCOUNTER — Encounter: Payer: Self-pay | Admitting: Obstetrics & Gynecology

## 2017-03-11 ENCOUNTER — Telehealth: Payer: Self-pay | Admitting: *Deleted

## 2017-03-11 NOTE — Telephone Encounter (Signed)
Pt notified of recently growth U/S.  Pt scheduled for BPP next week 816/18 @ 2:45 and F/U growth U/S 04/09/17 @ 3:15 for borderline polyhydramnios.

## 2017-03-11 NOTE — Telephone Encounter (Signed)
-----   Message from Lesly DukesKelly H Leggett, MD sent at 03/10/2017  2:03 PM EDT ----- Recommend recheck of growth in 4 weeks. would recheck BPP in 1 week and if AFI remains elevated begin antepartum testing

## 2017-03-19 ENCOUNTER — Other Ambulatory Visit: Payer: Self-pay | Admitting: Obstetrics & Gynecology

## 2017-03-19 ENCOUNTER — Ambulatory Visit (HOSPITAL_COMMUNITY)
Admission: RE | Admit: 2017-03-19 | Discharge: 2017-03-19 | Disposition: A | Discharge status: Home / Self Care | Source: Ambulatory Visit | Attending: Obstetrics & Gynecology | Admitting: Obstetrics & Gynecology

## 2017-03-19 DIAGNOSIS — Z3A34 34 weeks gestation of pregnancy: Secondary | ICD-10-CM | POA: Diagnosis not present

## 2017-03-19 DIAGNOSIS — O403XX0 Polyhydramnios, third trimester, not applicable or unspecified: Secondary | ICD-10-CM | POA: Insufficient documentation

## 2017-03-19 DIAGNOSIS — O26843 Uterine size-date discrepancy, third trimester: Secondary | ICD-10-CM | POA: Diagnosis not present

## 2017-03-22 LAB — OB RESULTS CONSOLE GBS: GBS: POSITIVE

## 2017-03-23 ENCOUNTER — Other Ambulatory Visit: Payer: Self-pay | Admitting: *Deleted

## 2017-03-23 DIAGNOSIS — O403XX1 Polyhydramnios, third trimester, fetus 1: Secondary | ICD-10-CM

## 2017-03-23 NOTE — Progress Notes (Signed)
bpp

## 2017-03-27 ENCOUNTER — Ambulatory Visit (HOSPITAL_COMMUNITY)
Admission: RE | Admit: 2017-03-27 | Discharge: 2017-03-27 | Disposition: A | Discharge status: Home / Self Care | Source: Ambulatory Visit | Attending: Obstetrics & Gynecology | Admitting: Obstetrics & Gynecology

## 2017-03-27 DIAGNOSIS — O403XX1 Polyhydramnios, third trimester, fetus 1: Secondary | ICD-10-CM

## 2017-03-27 DIAGNOSIS — Z3A35 35 weeks gestation of pregnancy: Secondary | ICD-10-CM | POA: Insufficient documentation

## 2017-03-27 DIAGNOSIS — O26843 Uterine size-date discrepancy, third trimester: Secondary | ICD-10-CM | POA: Insufficient documentation

## 2017-04-01 ENCOUNTER — Ambulatory Visit (INDEPENDENT_AMBULATORY_CARE_PROVIDER_SITE_OTHER): Admitting: Obstetrics & Gynecology

## 2017-04-01 VITALS — BP 118/73 | HR 71 | Wt 183.0 lb

## 2017-04-01 DIAGNOSIS — Z113 Encounter for screening for infections with a predominantly sexual mode of transmission: Secondary | ICD-10-CM

## 2017-04-01 DIAGNOSIS — Z3483 Encounter for supervision of other normal pregnancy, third trimester: Secondary | ICD-10-CM

## 2017-04-01 DIAGNOSIS — Z3480 Encounter for supervision of other normal pregnancy, unspecified trimester: Secondary | ICD-10-CM

## 2017-04-01 DIAGNOSIS — O403XX0 Polyhydramnios, third trimester, not applicable or unspecified: Secondary | ICD-10-CM

## 2017-04-01 DIAGNOSIS — Z331 Pregnant state, incidental: Secondary | ICD-10-CM

## 2017-04-01 LAB — GLUCOSE, POCT (MANUAL RESULT ENTRY): POC GLUCOSE: 88 mg/dL (ref 70–99)

## 2017-04-01 NOTE — Progress Notes (Signed)
   PRENATAL VISIT NOTE  Subjective:  Bonnie White is a 31 y.o. G3P2002 at 2473w0d being seen today for ongoing prenatal care.  She is currently monitored for the following issues for this high-risk pregnancy and has Polyhydramnios affecting pregnancy in third trimester and Supervision of normal pregnancy on her problem list.  Patient reports no complaints.  Contractions: Irritability. Vag. Bleeding: None.  Movement: Present. Denies leaking of fluid.   The following portions of the patient's history were reviewed and updated as appropriate: allergies, current medications, past family history, past medical history, past social history, past surgical history and problem list. Problem list updated.  Objective:   Vitals:   04/01/17 0911  BP: 118/73  Pulse: 71  Weight: 183 lb (83 kg)    Fetal Status:     Movement: Present     General:  Alert, oriented and cooperative. Patient is in no acute distress.  Skin: Skin is warm and dry. No rash noted.   Cardiovascular: Normal heart rate noted  Respiratory: Normal respiratory effort, no problems with respiration noted  Abdomen: Soft, gravid, appropriate for gestational age.  Pain/Pressure: Present     Pelvic: Cervical exam performed        Extremities: Normal range of motion.  Edema: Trace  Mental Status:  Normal mood and affect. Normal behavior. Normal judgment and thought content.   Assessment and Plan:  Pregnancy: G3P2002 at 6473w0d  1. Supervision of other normal pregnancy, antepartum  - POCT Glucose (CBG) - Culture, beta strep (group b only) - Urine cytology ancillary only  2. Polyhydramnios affecting pregnancy in third trimester antenatal testing. Weekly BPP Del @ 39 weeks- IOL scheduled today  Preterm labor symptoms and general obstetric precautions including but not limited to vaginal bleeding, contractions, leaking of fluid and fetal movement were reviewed in detail with the patient. Please refer to After Visit Summary for other  counseling recommendations.  Weekly f/u with weekly BPP  Willodean Rosenthalarolyn Harraway-Smith, MD

## 2017-04-01 NOTE — Progress Notes (Signed)
CBG today is 3188

## 2017-04-02 ENCOUNTER — Ambulatory Visit (HOSPITAL_COMMUNITY)
Admission: RE | Admit: 2017-04-02 | Discharge: 2017-04-02 | Disposition: A | Discharge status: Home / Self Care | Source: Ambulatory Visit | Attending: Obstetrics & Gynecology | Admitting: Obstetrics & Gynecology

## 2017-04-02 ENCOUNTER — Other Ambulatory Visit: Payer: Self-pay | Admitting: Obstetrics & Gynecology

## 2017-04-02 DIAGNOSIS — O403XX0 Polyhydramnios, third trimester, not applicable or unspecified: Secondary | ICD-10-CM | POA: Insufficient documentation

## 2017-04-02 DIAGNOSIS — Z3A36 36 weeks gestation of pregnancy: Secondary | ICD-10-CM | POA: Insufficient documentation

## 2017-04-02 DIAGNOSIS — O26843 Uterine size-date discrepancy, third trimester: Secondary | ICD-10-CM

## 2017-04-02 DIAGNOSIS — O322XX0 Maternal care for transverse and oblique lie, not applicable or unspecified: Secondary | ICD-10-CM | POA: Insufficient documentation

## 2017-04-02 LAB — URINE CYTOLOGY ANCILLARY ONLY
Chlamydia: NEGATIVE
Neisseria Gonorrhea: NEGATIVE

## 2017-04-03 LAB — CULTURE, BETA STREP (GROUP B ONLY)

## 2017-04-08 ENCOUNTER — Ambulatory Visit (INDEPENDENT_AMBULATORY_CARE_PROVIDER_SITE_OTHER): Admitting: Obstetrics & Gynecology

## 2017-04-08 VITALS — BP 119/69 | HR 81 | Wt 185.0 lb

## 2017-04-08 DIAGNOSIS — O403XX0 Polyhydramnios, third trimester, not applicable or unspecified: Secondary | ICD-10-CM

## 2017-04-08 LAB — GLUCOSE, POCT (MANUAL RESULT ENTRY): POC Glucose: 97 mg/dl (ref 70–99)

## 2017-04-08 NOTE — Progress Notes (Signed)
   PRENATAL VISIT NOTE  Subjective:  Bonnie White is a 31 y.o. G3P2002 at 5340w0d being seen today for ongoing prenatal care.  She is currently monitored for the following issues for this high-risk pregnancy and has Polyhydramnios affecting pregnancy in third trimester and Supervision of normal pregnancy on her problem list.  Patient reports no complaints.  Contractions: Irritability. Vag. Bleeding: None.  Movement: Present. Denies leaking of fluid.   The following portions of the patient's history were reviewed and updated as appropriate: allergies, current medications, past family history, past medical history, past social history, past surgical history and problem list. Problem list updated.  Objective:   Vitals:   04/08/17 0811  BP: 119/69  Pulse: 81  Weight: 185 lb (83.9 kg)    Fetal Status: Fetal Heart Rate (bpm): 135   Movement: Present     General:  Alert, oriented and cooperative. Patient is in no acute distress.  Skin: Skin is warm and dry. No rash noted.   Cardiovascular: Normal heart rate noted  Respiratory: Normal respiratory effort, no problems with respiration noted  Abdomen: Soft, gravid, appropriate for gestational age.  Pain/Pressure: Present     Pelvic: Cervical exam performed        Extremities: Normal range of motion.  Edema: Trace  Mental Status:  Normal mood and affect. Normal behavior. Normal judgment and thought content.   Assessment and Plan:  Pregnancy: G3P2002 at 3340w0d  1.  Poly--CBG random today.  Continue BPP weekly, Induction at 39 weeks.  Growth tomorrow.  2.  GBS positive--Rx in labor.  Term labor symptoms and general obstetric precautions including but not limited to vaginal bleeding, contractions, leaking of fluid and fetal movement were reviewed in detail with the patient. Please refer to After Visit Summary for other counseling recommendations.  Return in about 1 week (around 04/15/2017).   Elsie LincolnKelly Elisa Kutner, MD

## 2017-04-09 ENCOUNTER — Ambulatory Visit (HOSPITAL_COMMUNITY)
Admission: RE | Admit: 2017-04-09 | Discharge: 2017-04-09 | Disposition: A | Discharge status: Home / Self Care | Source: Ambulatory Visit | Attending: Obstetrics & Gynecology | Admitting: Obstetrics & Gynecology

## 2017-04-09 ENCOUNTER — Other Ambulatory Visit: Payer: Self-pay | Admitting: Obstetrics & Gynecology

## 2017-04-09 DIAGNOSIS — O26843 Uterine size-date discrepancy, third trimester: Secondary | ICD-10-CM | POA: Insufficient documentation

## 2017-04-09 DIAGNOSIS — Z3A37 37 weeks gestation of pregnancy: Secondary | ICD-10-CM | POA: Diagnosis not present

## 2017-04-09 DIAGNOSIS — O403XX0 Polyhydramnios, third trimester, not applicable or unspecified: Secondary | ICD-10-CM | POA: Diagnosis not present

## 2017-04-13 ENCOUNTER — Encounter (HOSPITAL_COMMUNITY): Payer: Self-pay

## 2017-04-13 ENCOUNTER — Inpatient Hospital Stay (HOSPITAL_COMMUNITY)
Admission: AD | Admit: 2017-04-13 | Discharge: 2017-04-13 | Disposition: A | Discharge status: Home / Self Care | Source: Ambulatory Visit | Attending: Obstetrics and Gynecology | Admitting: Obstetrics and Gynecology

## 2017-04-13 DIAGNOSIS — N898 Other specified noninflammatory disorders of vagina: Secondary | ICD-10-CM | POA: Insufficient documentation

## 2017-04-13 DIAGNOSIS — O403XX0 Polyhydramnios, third trimester, not applicable or unspecified: Secondary | ICD-10-CM | POA: Insufficient documentation

## 2017-04-13 DIAGNOSIS — Z3A37 37 weeks gestation of pregnancy: Secondary | ICD-10-CM | POA: Insufficient documentation

## 2017-04-13 DIAGNOSIS — Z87891 Personal history of nicotine dependence: Secondary | ICD-10-CM | POA: Insufficient documentation

## 2017-04-13 DIAGNOSIS — O139 Gestational [pregnancy-induced] hypertension without significant proteinuria, unspecified trimester: Secondary | ICD-10-CM | POA: Diagnosis present

## 2017-04-13 DIAGNOSIS — O26893 Other specified pregnancy related conditions, third trimester: Secondary | ICD-10-CM | POA: Diagnosis present

## 2017-04-13 DIAGNOSIS — O479 False labor, unspecified: Secondary | ICD-10-CM

## 2017-04-13 LAB — POCT FERN TEST: POCT FERN TEST: NEGATIVE

## 2017-04-13 NOTE — MAU Note (Signed)
I have communicated with Dr. Nira Retortegele and reviewed vital signs:  Vitals:   04/13/17 0102 04/13/17 0116  BP: (!) 141/95 130/88  Pulse: 79 78  Resp: 18   Temp: 98.7 F (37.1 C)   SpO2: 99%     Vaginal exam:  Dilation: 2.5 Effacement (%): Thick Station: Ballotable Exam by:: [employer] Ariyanna Oien RN,   Also reviewed contraction pattern and that non-stress test is reactive.  It has been documented that patient is contracting occasionally with no cervical change over 1.5 hours not indicating active labor.  Patient denies any other complaints.  Based on this report provider has given order for discharge.  A discharge order and diagnosis entered by a provider.   Labor discharge instructions reviewed with patient.

## 2017-04-13 NOTE — Discharge Instructions (Signed)

## 2017-04-13 NOTE — MAU Note (Addendum)
Pt c/o contractions every 10-15 minutes since for the last two hours. Pt denies bleeding. Pt states the baby is moving normally. Pt states she has had some leaking of clear fluid since 11pm but isn't sure if her water broke or not.

## 2017-04-13 NOTE — MAU Provider Note (Signed)
Chief Complaint:  Contractions   Seen at 0130 for r/o SROM  HPI: Bonnie White is a 31 y.o. G3P2002 at 3537w5dwho presents to maternity admissions reporting Leaking of fluid since 2300hrs.  Slow trickling and wetness on clothing She reports good fetal movement, denies vaginal bleeding, vaginal itching/burning, urinary symptoms, h/a, dizziness, n/v, or fever/chills.    Vaginal Discharge  The patient's primary symptoms include pelvic pain and vaginal discharge. The patient's pertinent negatives include no genital itching, genital lesions, genital odor or vaginal bleeding. This is a new problem. The current episode started today. The problem occurs intermittently. The problem has been unchanged. The pain is mild. The problem affects both sides. She is pregnant. Pertinent negatives include no constipation, diarrhea, fever, headaches, nausea or vomiting. The vaginal discharge was clear and watery. There has been no bleeding. She has not been passing clots. She has not been passing tissue. Nothing aggravates the symptoms. She has tried nothing for the symptoms.    RN Note: Pt c/o contractions every 10-15 minutes since for the last two hours. Pt denies bleeding. Pt states the baby is moving normally. Pt states she has had some leaking of clear fluid since 11pm but isn't sure if her water broke or not.  Past Medical History: Past Medical History:  Diagnosis Date  . Medical history non-contributory     Past obstetric history: OB History  Gravida Para Term Preterm AB Living  3 2 2     2   SAB TAB Ectopic Multiple Live Births        0 2    # Outcome Date GA Lbr Len/2nd Weight Sex Delivery Anes PTL Lv  3 Current           2 Term 05/21/15 441w3d 06:50 / 00:14 8 lb 14.7 oz (4.045 kg) M Vag-Spont EPI  LIV     Birth Comments: none  1 Term 06/26/08    F Vag-Spont  Y LIV      Past Surgical History: Past Surgical History:  Procedure Laterality Date  . NO PAST SURGERIES    . WISDOM TOOTH EXTRACTION       Family History: Family History  Problem Relation Age of Onset  . Ulcerative colitis Father   . Seizures Sister     Social History: Social History  Substance Use Topics  . Smoking status: Former Smoker    Packs/day: 8.00    Years: 1.00    Types: Cigarettes  . Smokeless tobacco: Never Used  . Alcohol use No    Allergies: No Known Allergies  Meds:  Prescriptions Prior to Admission  Medication Sig Dispense Refill Last Dose  . acetaminophen (TYLENOL) 325 MG tablet Take 650 mg by mouth every 6 (six) hours as needed for moderate pain.   Taking  . hydrocortisone (ANUSOL-HC) 2.5 % rectal cream Place 1 application rectally 2 (two) times daily. 30 g 0 Taking  . ondansetron (ZOFRAN) 4 MG tablet Take 1 tablet (4 mg total) by mouth every 8 (eight) hours as needed for nausea or vomiting. 20 tablet 0 Taking  . Prenatal Multivit-Min-Fe-FA (PRENATAL VITAMINS PO) Take 1 tablet by mouth daily.    Taking    ROS:  Review of Systems  Constitutional: Negative for fever.  Gastrointestinal: Negative for constipation, diarrhea, nausea and vomiting.  Genitourinary: Positive for pelvic pain and vaginal discharge.  Neurological: Negative for headaches.     I have reviewed patient's Past Medical Hx, Surgical Hx, Family Hx, Social Hx, medications and allergies.  Physical Exam  Patient Vitals for the past 24 hrs:  BP Temp Temp src Pulse Resp SpO2 Height Weight  04/13/17 0116 130/88 - - 78 - - - -  04/13/17 0102 (!) 141/95 98.7 F (37.1 C) Oral 79 18 99 %  (1.626 m) 187 lb 12 oz (85.2 kg)   Constitutional: Well-developed, well-nourished female in no acute distress.  Cardiovascular: normal rate and rhythm Respiratory: normal effort, no distress GI: Abd soft, non-tender, gravid appropriate for gestational age.  MS: Extremities nontender, no edema, normal ROM Neurologic: Alert and oriented x 4.  GU: Neg CVAT.  PELVIC EXAM:  Sterile speculum exam, vagina dry with scant thick white  discharge, no pooling, no ferning  FHT:  Baseline 135 , moderate variability, accelerations present, no decelerations Contractions: irregular   Labs: No results found for this or any previous visit (from the past 24 hour(s)). O/POS/-- (02/13 1447)  Imaging:    MAU Course/MDM: I have Done a speculum exam and fern test which was Negative.  Pooling was negative Nitrazine not done.    Pt stable at time of discharge.  Assessment: 1. Polyhydramnios affecting pregnancy in third trimester   2.     Vaginal discharge, no evidence of ruptured membranes  Plan: Plan per RN labor eval    Wynelle Bourgeois CNM, MSN Certified Nurse-Midwife 04/13/2017 1:31 AM

## 2017-04-15 ENCOUNTER — Ambulatory Visit (INDEPENDENT_AMBULATORY_CARE_PROVIDER_SITE_OTHER): Admitting: Obstetrics & Gynecology

## 2017-04-15 ENCOUNTER — Telehealth (HOSPITAL_COMMUNITY): Payer: Self-pay | Admitting: *Deleted

## 2017-04-15 VITALS — BP 131/77 | HR 112 | Wt 185.0 lb

## 2017-04-15 DIAGNOSIS — O10919 Unspecified pre-existing hypertension complicating pregnancy, unspecified trimester: Secondary | ICD-10-CM

## 2017-04-15 DIAGNOSIS — O403XX0 Polyhydramnios, third trimester, not applicable or unspecified: Secondary | ICD-10-CM

## 2017-04-15 DIAGNOSIS — O10913 Unspecified pre-existing hypertension complicating pregnancy, third trimester: Secondary | ICD-10-CM

## 2017-04-15 NOTE — Telephone Encounter (Signed)
Preadmission screen  

## 2017-04-15 NOTE — Progress Notes (Signed)
Went to hospital Sunday night with regular contractions, measured 2.5 cm and after a few hours they were sent home. Still feeling contractions pretty regularly. Would like to be checked todayD.S.    PRENATAL VISIT NOTE  Subjective:  Bonnie White is a 31 y.o. G3P2002 at 3515w0d being seen today for ongoing prenatal care.  She is currently monitored for the following issues for this high-risk pregnancy and has Polyhydramnios affecting pregnancy in third trimester; Supervision of normal pregnancy; and Transient hypertension of pregnancy on her problem list.  Patient reports no complaints.  Contractions: Regular. Vag. Bleeding: None.  Movement: Present. Denies leaking of fluid.   The following portions of the patient's history were reviewed and updated as appropriate: allergies, current medications, past family history, past medical history, past social history, past surgical history and problem list. Problem list updated.  Objective:   Vitals:   04/15/17 1007  BP: 131/77  Pulse: (!) 112  Weight: 185 lb (83.9 kg)    Fetal Status: Fetal Heart Rate (bpm): 137   Movement: Present     General:  Alert, oriented and cooperative. Patient is in no acute distress.  Skin: Skin is warm and dry. No rash noted.   Cardiovascular: Normal heart rate noted  Respiratory: Normal respiratory effort, no problems with respiration noted  Abdomen: Soft, gravid, appropriate for gestational age.  Pain/Pressure: Present     Pelvic: Cervical exam performed        Extremities: Normal range of motion.  Edema: Trace  Mental Status:  Normal mood and affect. Normal behavior. Normal judgment and thought content.   Assessment and Plan:  Pregnancy: G3P2002 at 3715w0d  1.  Polyhydramnis - US Fetal BPP W/O Non Stress; Future. -Induction at 39 weeks -BP nml today  Term labor symptoms and general obstetric precautions including but not limited to vaginal bleeding, contractions, leaking of fluid and fetal movement were  reviewed in detail with the patient. Please refer to After Visit Summary for other counseling recommendations.   RTC post partum exam.  Elsie LincolnKelly Kaylani Fromme, MD

## 2017-04-16 ENCOUNTER — Ambulatory Visit (HOSPITAL_COMMUNITY)
Admission: RE | Admit: 2017-04-16 | Discharge: 2017-04-16 | Disposition: A | Discharge status: Home / Self Care | Source: Ambulatory Visit | Attending: Obstetrics & Gynecology | Admitting: Obstetrics & Gynecology

## 2017-04-16 DIAGNOSIS — Z3A38 38 weeks gestation of pregnancy: Secondary | ICD-10-CM | POA: Diagnosis not present

## 2017-04-16 DIAGNOSIS — O403XX0 Polyhydramnios, third trimester, not applicable or unspecified: Secondary | ICD-10-CM | POA: Insufficient documentation

## 2017-04-16 NOTE — Addendum Note (Signed)
Encounter addended by: Lenoard AdenJohnson, Daanish Copes M, RT on: 04/16/2017 12:15 PM<BR>    Actions taken: Imaging Exam ended

## 2017-04-17 ENCOUNTER — Other Ambulatory Visit: Payer: Self-pay | Admitting: Advanced Practice Midwife

## 2017-04-22 ENCOUNTER — Encounter (HOSPITAL_COMMUNITY): Payer: Self-pay

## 2017-04-22 ENCOUNTER — Encounter (HOSPITAL_COMMUNITY)
Admission: RE | Disposition: A | Discharge status: Home / Self Care | Payer: Self-pay | Source: Ambulatory Visit | Attending: Family Medicine

## 2017-04-22 ENCOUNTER — Inpatient Hospital Stay (HOSPITAL_COMMUNITY)
Admission: RE | Admit: 2017-04-22 | Discharge: 2017-04-24 | DRG: 766 | Disposition: A | Discharge status: Home / Self Care | Source: Ambulatory Visit | Attending: Family Medicine | Admitting: Family Medicine

## 2017-04-22 ENCOUNTER — Inpatient Hospital Stay (HOSPITAL_COMMUNITY): Admitting: Certified Registered Nurse Anesthetist

## 2017-04-22 DIAGNOSIS — Z302 Encounter for sterilization: Secondary | ICD-10-CM

## 2017-04-22 DIAGNOSIS — Z87891 Personal history of nicotine dependence: Secondary | ICD-10-CM | POA: Diagnosis not present

## 2017-04-22 DIAGNOSIS — O134 Gestational [pregnancy-induced] hypertension without significant proteinuria, complicating childbirth: Secondary | ICD-10-CM | POA: Diagnosis present

## 2017-04-22 DIAGNOSIS — O403XX0 Polyhydramnios, third trimester, not applicable or unspecified: Secondary | ICD-10-CM | POA: Diagnosis not present

## 2017-04-22 DIAGNOSIS — O99824 Streptococcus B carrier state complicating childbirth: Secondary | ICD-10-CM | POA: Diagnosis not present

## 2017-04-22 DIAGNOSIS — Z3490 Encounter for supervision of normal pregnancy, unspecified, unspecified trimester: Secondary | ICD-10-CM | POA: Diagnosis present

## 2017-04-22 DIAGNOSIS — O139 Gestational [pregnancy-induced] hypertension without significant proteinuria, unspecified trimester: Secondary | ICD-10-CM | POA: Diagnosis present

## 2017-04-22 DIAGNOSIS — Z3A39 39 weeks gestation of pregnancy: Secondary | ICD-10-CM

## 2017-04-22 DIAGNOSIS — Z98891 History of uterine scar from previous surgery: Secondary | ICD-10-CM

## 2017-04-22 DIAGNOSIS — Z9851 Tubal ligation status: Secondary | ICD-10-CM

## 2017-04-22 LAB — CBC
HCT: 32.9 % — ABNORMAL LOW (ref 36.0–46.0)
HEMOGLOBIN: 11.4 g/dL — AB (ref 12.0–15.0)
MCH: 32 pg (ref 26.0–34.0)
MCHC: 34.7 g/dL (ref 30.0–36.0)
MCV: 92.4 fL (ref 78.0–100.0)
Platelets: 228 10*3/uL (ref 150–400)
RBC: 3.56 MIL/uL — ABNORMAL LOW (ref 3.87–5.11)
RDW: 13.2 % (ref 11.5–15.5)
WBC: 9.9 10*3/uL (ref 4.0–10.5)

## 2017-04-22 LAB — TYPE AND SCREEN
ABO/RH(D): O POS
ANTIBODY SCREEN: NEGATIVE

## 2017-04-22 LAB — RPR: RPR Ser Ql: NONREACTIVE

## 2017-04-22 SURGERY — Surgical Case
Anesthesia: Regional

## 2017-04-22 MED ORDER — PENICILLIN G POT IN DEXTROSE 60000 UNIT/ML IV SOLN
3.0000 10*6.[IU] | INTRAVENOUS | Status: DC
  Administered 2017-04-22: 3 10*6.[IU] via INTRAVENOUS
  Filled 2017-04-22 (×3): qty 50

## 2017-04-22 MED ORDER — DIPHENHYDRAMINE HCL 50 MG/ML IJ SOLN: 12.5000 mg | INTRAMUSCULAR | Status: DC | PRN

## 2017-04-22 MED ORDER — ACETAMINOPHEN 325 MG PO TABS: 650.0000 mg | ORAL_TABLET | ORAL | Status: DC | PRN

## 2017-04-22 MED ORDER — MEPERIDINE HCL 25 MG/ML IJ SOLN
INTRAMUSCULAR | Status: AC
  Filled 2017-04-22: qty 1

## 2017-04-22 MED ORDER — DEXTROSE 5 % IV SOLN: 500.0000 mg | INTRAVENOUS | Status: DC

## 2017-04-22 MED ORDER — LACTATED RINGERS IV SOLN
INTRAVENOUS | Status: DC
  Administered 2017-04-23: 02:00:00 via INTRAVENOUS

## 2017-04-22 MED ORDER — SUCCINYLCHOLINE CHLORIDE 200 MG/10ML IV SOSY
PREFILLED_SYRINGE | INTRAVENOUS | Status: AC
  Filled 2017-04-22: qty 10

## 2017-04-22 MED ORDER — SUCCINYLCHOLINE CHLORIDE 20 MG/ML IJ SOLN
INTRAMUSCULAR | Status: DC | PRN
  Administered 2017-04-22: 120 mg via INTRAVENOUS

## 2017-04-22 MED ORDER — COCONUT OIL OIL: 1.0000 "application " | TOPICAL_OIL | Status: DC | PRN

## 2017-04-22 MED ORDER — MIDAZOLAM HCL 2 MG/2ML IJ SOLN
INTRAMUSCULAR | Status: AC
  Filled 2017-04-22: qty 2

## 2017-04-22 MED ORDER — FENTANYL CITRATE (PF) 100 MCG/2ML IJ SOLN
INTRAMUSCULAR | Status: DC | PRN
  Administered 2017-04-22: 250 ug via INTRAVENOUS

## 2017-04-22 MED ORDER — LACTATED RINGERS IV SOLN: 500.0000 mL | INTRAVENOUS | Status: DC | PRN

## 2017-04-22 MED ORDER — EPHEDRINE 5 MG/ML INJ: 10.0000 mg | INTRAVENOUS | Status: DC | PRN

## 2017-04-22 MED ORDER — PROPOFOL 10 MG/ML IV BOLUS
INTRAVENOUS | Status: DC | PRN
Start: 2017-04-22 — End: 2017-04-22
  Administered 2017-04-22 (×2): 200 mg via INTRAVENOUS

## 2017-04-22 MED ORDER — ENOXAPARIN SODIUM 40 MG/0.4ML ~~LOC~~ SOLN
40.0000 mg | SUBCUTANEOUS | Status: DC
  Administered 2017-04-23 – 2017-04-24 (×2): 40 mg via SUBCUTANEOUS
  Filled 2017-04-22 (×5): qty 0.4

## 2017-04-22 MED ORDER — LIDOCAINE HCL (PF) 1 % IJ SOLN: 30.0000 mL | INTRAMUSCULAR | Status: DC | PRN

## 2017-04-22 MED ORDER — TERBUTALINE SULFATE 1 MG/ML IJ SOLN: 0.2500 mg | Freq: Once | INTRAMUSCULAR | Status: DC | PRN

## 2017-04-22 MED ORDER — SCOPOLAMINE 1 MG/3DAYS TD PT72
MEDICATED_PATCH | TRANSDERMAL | Status: AC
  Filled 2017-04-22: qty 1

## 2017-04-22 MED ORDER — DIPHENHYDRAMINE HCL 50 MG/ML IJ SOLN: 12.5000 mg | Freq: Four times a day (QID) | INTRAMUSCULAR | Status: DC | PRN

## 2017-04-22 MED ORDER — OXYCODONE-ACETAMINOPHEN 5-325 MG PO TABS
1.0000 | ORAL_TABLET | ORAL | Status: DC | PRN
Start: 2017-04-22 — End: 2017-04-24
  Administered 2017-04-23 – 2017-04-24 (×2): 1 via ORAL
  Filled 2017-04-22: qty 1

## 2017-04-22 MED ORDER — MIDAZOLAM HCL 2 MG/2ML IJ SOLN
INTRAMUSCULAR | Status: DC | PRN
  Administered 2017-04-22: 2 mg via INTRAVENOUS

## 2017-04-22 MED ORDER — PHENYLEPHRINE HCL 10 MG/ML IJ SOLN
INTRAMUSCULAR | Status: DC | PRN
  Administered 2017-04-22: 80 ug via INTRAVENOUS

## 2017-04-22 MED ORDER — WITCH HAZEL-GLYCERIN EX PADS: 1.0000 "application " | MEDICATED_PAD | CUTANEOUS | Status: DC | PRN

## 2017-04-22 MED ORDER — ACETAMINOPHEN 325 MG PO TABS
650.0000 mg | ORAL_TABLET | ORAL | Status: DC | PRN
  Administered 2017-04-23: 650 mg via ORAL
  Filled 2017-04-22: qty 2

## 2017-04-22 MED ORDER — FENTANYL CITRATE (PF) 100 MCG/2ML IJ SOLN
INTRAMUSCULAR | Status: AC
  Filled 2017-04-22: qty 2

## 2017-04-22 MED ORDER — PRENATAL MULTIVITAMIN CH
1.0000 | ORAL_TABLET | Freq: Every day | ORAL | Status: DC
  Administered 2017-04-23 – 2017-04-24 (×2): 1 via ORAL
  Filled 2017-04-22 (×2): qty 1

## 2017-04-22 MED ORDER — TETANUS-DIPHTH-ACELL PERTUSSIS 5-2.5-18.5 LF-MCG/0.5 IM SUSP: 0.5000 mL | Freq: Once | INTRAMUSCULAR | Status: DC

## 2017-04-22 MED ORDER — FENTANYL CITRATE (PF) 250 MCG/5ML IJ SOLN
INTRAMUSCULAR | Status: AC
  Filled 2017-04-22: qty 5

## 2017-04-22 MED ORDER — OXYTOCIN BOLUS FROM INFUSION: 500.0000 mL | Freq: Once | INTRAVENOUS | Status: DC

## 2017-04-22 MED ORDER — SODIUM CHLORIDE 0.9 % IR SOLN
Status: DC | PRN
  Administered 2017-04-22: 1000 mL

## 2017-04-22 MED ORDER — HYDROMORPHONE 1 MG/ML IV SOLN
INTRAVENOUS | Status: DC
  Administered 2017-04-22: 11:00:00 via INTRAVENOUS
  Administered 2017-04-22: 1.5 mg via INTRAVENOUS
  Administered 2017-04-22: 0.6 mg via INTRAVENOUS
  Administered 2017-04-22: 1.5 mg via INTRAVENOUS
  Administered 2017-04-23: 0.359 mg via INTRAVENOUS
  Administered 2017-04-23: 0.6 mg via INTRAVENOUS
  Filled 2017-04-22: qty 25

## 2017-04-22 MED ORDER — LACTATED RINGERS IV SOLN: INTRAVENOUS | Status: AC

## 2017-04-22 MED ORDER — LACTATED RINGERS IV SOLN
INTRAVENOUS | Status: DC | PRN
  Administered 2017-04-22: 07:00:00 via INTRAVENOUS

## 2017-04-22 MED ORDER — MEPERIDINE HCL 25 MG/ML IJ SOLN
6.2500 mg | INTRAMUSCULAR | Status: DC | PRN
  Administered 2017-04-22 (×2): 6.25 mg via INTRAVENOUS

## 2017-04-22 MED ORDER — IBUPROFEN 600 MG PO TABS
600.0000 mg | ORAL_TABLET | Freq: Four times a day (QID) | ORAL | Status: DC
  Administered 2017-04-22 – 2017-04-24 (×10): 600 mg via ORAL
  Filled 2017-04-22 (×10): qty 1

## 2017-04-22 MED ORDER — MISOPROSTOL 25 MCG QUARTER TABLET
25.0000 ug | ORAL_TABLET | ORAL | Status: DC | PRN
  Administered 2017-04-22 (×2): 25 ug via VAGINAL
  Filled 2017-04-22 (×2): qty 1

## 2017-04-22 MED ORDER — FENTANYL 2.5 MCG/ML BUPIVACAINE 1/10 % EPIDURAL INFUSION (WH - ANES): 14.0000 mL/h | INTRAMUSCULAR | Status: DC | PRN

## 2017-04-22 MED ORDER — NALOXONE HCL 0.4 MG/ML IJ SOLN: 0.4000 mg | INTRAMUSCULAR | Status: DC | PRN

## 2017-04-22 MED ORDER — OXYTOCIN 10 UNIT/ML IJ SOLN
INTRAMUSCULAR | Status: AC
  Filled 2017-04-22: qty 4

## 2017-04-22 MED ORDER — MENTHOL 3 MG MT LOZG: 1.0000 | LOZENGE | OROMUCOSAL | Status: DC | PRN

## 2017-04-22 MED ORDER — LACTATED RINGERS IV SOLN
INTRAVENOUS | Status: DC | PRN
  Administered 2017-04-22 (×2): via INTRAVENOUS

## 2017-04-22 MED ORDER — HYDROMORPHONE HCL 1 MG/ML IJ SOLN
INTRAMUSCULAR | Status: DC | PRN
  Administered 2017-04-22: 1 mg via INTRAVENOUS

## 2017-04-22 MED ORDER — SOD CITRATE-CITRIC ACID 500-334 MG/5ML PO SOLN
30.0000 mL | ORAL | Status: DC | PRN
  Filled 2017-04-22: qty 15

## 2017-04-22 MED ORDER — OXYTOCIN 40 UNITS IN LACTATED RINGERS INFUSION - SIMPLE MED
1.0000 m[IU]/min | INTRAVENOUS | Status: DC
  Administered 2017-04-22: 2 m[IU]/min via INTRAVENOUS
  Filled 2017-04-22: qty 1000

## 2017-04-22 MED ORDER — LACTATED RINGERS IV SOLN: 500.0000 mL | Freq: Once | INTRAVENOUS | Status: DC

## 2017-04-22 MED ORDER — FENTANYL CITRATE (PF) 100 MCG/2ML IJ SOLN
25.0000 ug | INTRAMUSCULAR | Status: DC | PRN
  Administered 2017-04-22 (×3): 50 ug via INTRAVENOUS

## 2017-04-22 MED ORDER — DEXTROSE 5 % IV SOLN
500.0000 mg | Freq: Once | INTRAVENOUS | Status: AC
  Administered 2017-04-22: 500 mg via INTRAVENOUS
  Filled 2017-04-22: qty 500

## 2017-04-22 MED ORDER — OXYTOCIN 40 UNITS IN LACTATED RINGERS INFUSION - SIMPLE MED: 2.5000 [IU]/h | INTRAVENOUS | Status: DC

## 2017-04-22 MED ORDER — ONDANSETRON HCL 4 MG/2ML IJ SOLN: 4.0000 mg | Freq: Four times a day (QID) | INTRAMUSCULAR | Status: DC | PRN

## 2017-04-22 MED ORDER — CEFAZOLIN SODIUM-DEXTROSE 2-3 GM-% IV SOLR
INTRAVENOUS | Status: DC | PRN
  Administered 2017-04-22: 2 g via INTRAVENOUS

## 2017-04-22 MED ORDER — SENNOSIDES-DOCUSATE SODIUM 8.6-50 MG PO TABS
2.0000 | ORAL_TABLET | ORAL | Status: DC
  Administered 2017-04-22 – 2017-04-23 (×2): 2 via ORAL
  Filled 2017-04-22: qty 2

## 2017-04-22 MED ORDER — PHENYLEPHRINE 40 MCG/ML (10ML) SYRINGE FOR IV PUSH (FOR BLOOD PRESSURE SUPPORT)
PREFILLED_SYRINGE | INTRAVENOUS | Status: AC
Start: 2017-04-22 — End: ?
  Filled 2017-04-22: qty 10

## 2017-04-22 MED ORDER — OXYCODONE-ACETAMINOPHEN 5-325 MG PO TABS
2.0000 | ORAL_TABLET | ORAL | Status: DC | PRN
  Administered 2017-04-23 – 2017-04-24 (×3): 2 via ORAL
  Filled 2017-04-22 (×4): qty 2

## 2017-04-22 MED ORDER — OXYCODONE-ACETAMINOPHEN 5-325 MG PO TABS: 1.0000 | ORAL_TABLET | ORAL | Status: DC | PRN

## 2017-04-22 MED ORDER — PROPOFOL 10 MG/ML IV BOLUS
INTRAVENOUS | Status: AC
  Filled 2017-04-22: qty 40

## 2017-04-22 MED ORDER — FENTANYL CITRATE (PF) 100 MCG/2ML IJ SOLN: 100.0000 ug | INTRAMUSCULAR | Status: DC | PRN

## 2017-04-22 MED ORDER — OXYTOCIN 10 UNIT/ML IJ SOLN
INTRAVENOUS | Status: DC | PRN
  Administered 2017-04-22: 40 [IU] via INTRAVENOUS

## 2017-04-22 MED ORDER — DIPHENHYDRAMINE HCL 25 MG PO CAPS: 25.0000 mg | ORAL_CAPSULE | Freq: Four times a day (QID) | ORAL | Status: DC | PRN

## 2017-04-22 MED ORDER — SIMETHICONE 80 MG PO CHEW
80.0000 mg | CHEWABLE_TABLET | Freq: Three times a day (TID) | ORAL | Status: DC
  Administered 2017-04-22 – 2017-04-24 (×7): 80 mg via ORAL
  Filled 2017-04-22 (×7): qty 1

## 2017-04-22 MED ORDER — ZOLPIDEM TARTRATE 5 MG PO TABS
5.0000 mg | ORAL_TABLET | Freq: Once | ORAL | Status: AC
  Administered 2017-04-22: 5 mg via ORAL
  Filled 2017-04-22: qty 1

## 2017-04-22 MED ORDER — DIBUCAINE 1 % RE OINT: 1.0000 "application " | TOPICAL_OINTMENT | RECTAL | Status: DC | PRN

## 2017-04-22 MED ORDER — HYDROMORPHONE HCL 1 MG/ML IJ SOLN
INTRAMUSCULAR | Status: AC
  Filled 2017-04-22: qty 0.5

## 2017-04-22 MED ORDER — DIPHENHYDRAMINE HCL 12.5 MG/5ML PO ELIX: 12.5000 mg | ORAL_SOLUTION | Freq: Four times a day (QID) | ORAL | Status: DC | PRN

## 2017-04-22 MED ORDER — SIMETHICONE 80 MG PO CHEW: 80.0000 mg | CHEWABLE_TABLET | ORAL | Status: DC | PRN

## 2017-04-22 MED ORDER — ZOLPIDEM TARTRATE 5 MG PO TABS: 5.0000 mg | ORAL_TABLET | Freq: Every evening | ORAL | Status: DC | PRN

## 2017-04-22 MED ORDER — PHENYLEPHRINE 40 MCG/ML (10ML) SYRINGE FOR IV PUSH (FOR BLOOD PRESSURE SUPPORT): 80.0000 ug | PREFILLED_SYRINGE | INTRAVENOUS | Status: DC | PRN

## 2017-04-22 MED ORDER — HYDROMORPHONE HCL 1 MG/ML IJ SOLN
0.5000 mg | Freq: Once | INTRAMUSCULAR | Status: AC
  Administered 2017-04-22: 0.5 mg via INTRAVENOUS

## 2017-04-22 MED ORDER — HYDROMORPHONE HCL 1 MG/ML IJ SOLN
INTRAMUSCULAR | Status: AC
  Filled 2017-04-22: qty 1

## 2017-04-22 MED ORDER — ONDANSETRON HCL 4 MG/2ML IJ SOLN
INTRAMUSCULAR | Status: AC
  Filled 2017-04-22: qty 2

## 2017-04-22 MED ORDER — OXYCODONE-ACETAMINOPHEN 5-325 MG PO TABS: 2.0000 | ORAL_TABLET | ORAL | Status: DC | PRN

## 2017-04-22 MED ORDER — SODIUM CHLORIDE 0.9% FLUSH: 9.0000 mL | INTRAVENOUS | Status: DC | PRN

## 2017-04-22 MED ORDER — LACTATED RINGERS IV SOLN
INTRAVENOUS | Status: DC
  Administered 2017-04-22: 01:00:00 via INTRAVENOUS

## 2017-04-22 MED ORDER — DEXTROSE 5 % IV SOLN
5.0000 10*6.[IU] | Freq: Once | INTRAVENOUS | Status: AC
  Administered 2017-04-22: 5 10*6.[IU] via INTRAVENOUS
  Filled 2017-04-22: qty 5

## 2017-04-22 MED ORDER — FREE WATER
Status: DC | PRN
  Administered 2017-04-22: 1000 mL

## 2017-04-22 MED ORDER — ONDANSETRON HCL 4 MG/2ML IJ SOLN
INTRAMUSCULAR | Status: DC | PRN
  Administered 2017-04-22: 4 mg via INTRAVENOUS

## 2017-04-22 MED ORDER — ONDANSETRON HCL 4 MG/2ML IJ SOLN: 4.0000 mg | Freq: Once | INTRAMUSCULAR | Status: DC | PRN

## 2017-04-22 MED ORDER — SIMETHICONE 80 MG PO CHEW
80.0000 mg | CHEWABLE_TABLET | ORAL | Status: DC
  Administered 2017-04-22 – 2017-04-23 (×2): 80 mg via ORAL
  Filled 2017-04-22 (×2): qty 1

## 2017-04-22 MED ORDER — OXYTOCIN 40 UNITS IN LACTATED RINGERS INFUSION - SIMPLE MED
2.5000 [IU]/h | INTRAVENOUS | Status: AC
  Administered 2017-04-22: 2.5 [IU]/h via INTRAVENOUS
  Filled 2017-04-22: qty 1000

## 2017-04-22 SURGICAL SUPPLY — 33 items
BENZOIN TINCTURE PRP APPL 2/3 (GAUZE/BANDAGES/DRESSINGS) ×3 IMPLANT
CHLORAPREP W/TINT 26ML (MISCELLANEOUS) ×3 IMPLANT
CLAMP CORD UMBIL (MISCELLANEOUS) IMPLANT
CLIP FILSHIE TUBAL LIGA STRL (Clip) ×3 IMPLANT
CLOSURE STERI STRIP 1/2 X4 (GAUZE/BANDAGES/DRESSINGS) ×3 IMPLANT
CLOTH BEACON ORANGE TIMEOUT ST (SAFETY) ×3 IMPLANT
DRSG OPSITE POSTOP 4X10 (GAUZE/BANDAGES/DRESSINGS) ×3 IMPLANT
ELECT REM PT RETURN 9FT ADLT (ELECTROSURGICAL) ×3
ELECTRODE REM PT RTRN 9FT ADLT (ELECTROSURGICAL) ×1 IMPLANT
EXTRACTOR VACUUM M CUP 4 TUBE (SUCTIONS) IMPLANT
EXTRACTOR VACUUM M CUP 4' TUBE (SUCTIONS)
GLOVE BIOGEL PI IND STRL 7.0 (GLOVE) ×2 IMPLANT
GLOVE BIOGEL PI IND STRL 7.5 (GLOVE) ×2 IMPLANT
GLOVE BIOGEL PI INDICATOR 7.0 (GLOVE) ×4
GLOVE BIOGEL PI INDICATOR 7.5 (GLOVE) ×4
GLOVE ECLIPSE 7.5 STRL STRAW (GLOVE) ×3 IMPLANT
GOWN STRL REUS W/TWL LRG LVL3 (GOWN DISPOSABLE) ×9 IMPLANT
KIT ABG SYR 3ML LUER SLIP (SYRINGE) IMPLANT
NEEDLE HYPO 25X5/8 SAFETYGLIDE (NEEDLE) IMPLANT
NS IRRIG 1000ML POUR BTL (IV SOLUTION) ×3 IMPLANT
PACK C SECTION WH (CUSTOM PROCEDURE TRAY) ×3 IMPLANT
PAD OB MATERNITY 4.3X12.25 (PERSONAL CARE ITEMS) ×3 IMPLANT
PENCIL SMOKE EVAC W/HOLSTER (ELECTROSURGICAL) ×3 IMPLANT
RTRCTR C-SECT PINK 25CM LRG (MISCELLANEOUS) ×3 IMPLANT
SPONGE LAP 18X18 X RAY DECT (DISPOSABLE) ×3 IMPLANT
STRIP CLOSURE SKIN 1/2X4 (GAUZE/BANDAGES/DRESSINGS) ×2 IMPLANT
SUT VIC AB 0 CTX 36 (SUTURE) ×6
SUT VIC AB 0 CTX36XBRD ANBCTRL (SUTURE) ×3 IMPLANT
SUT VIC AB 2-0 CT1 27 (SUTURE) ×2
SUT VIC AB 2-0 CT1 TAPERPNT 27 (SUTURE) ×1 IMPLANT
SUT VIC AB 4-0 KS 27 (SUTURE) ×3 IMPLANT
TOWEL OR 17X24 6PK STRL BLUE (TOWEL DISPOSABLE) ×3 IMPLANT
TRAY FOLEY BAG SILVER LF 14FR (SET/KITS/TRAYS/PACK) ×3 IMPLANT

## 2017-04-22 NOTE — Anesthesia Procedure Notes (Signed)
Procedure Name: Intubation Date/Time: 04/22/2017 7:02 AM Performed by: Raenette Rover Pre-anesthesia Checklist: Patient identified, Emergency Drugs available, Suction available, Patient being monitored and Timeout performed Patient Re-evaluated:Patient Re-evaluated prior to induction Oxygen Delivery Method: Circle system utilized Preoxygenation: Pre-oxygenation with 100% oxygen Induction Type: IV induction Laryngoscope Size: Glidescope and 3 Grade View: Grade III Tube type: Oral Tube size: 7.0 mm Number of attempts: 3 Airway Equipment and Method: Stylet and Video-laryngoscopy Placement Confirmation: ETT inserted through vocal cords under direct vision,  positive ETCO2 and breath sounds checked- equal and bilateral Secured at: 22 cm Tube secured with: Tape Dental Injury: Teeth and Oropharynx as per pre-operative assessment  Difficulty Due To: Difficulty was unanticipated Comments: DL with MAC blade by Yetta Barre CRNA, unable to visualize cords , DL with glidescope MAC 3, due to swelling in oropharynx, cords had minimal exposure. Mask ventilated easily. DL by Dr. Roanna Banning, unable to pass ETT. Bougie used to intubate by Dr. Ambrose Pancoast, successful intubation, + etCO2, BBS equal. No damage to oropharynx or teeth noted.

## 2017-04-22 NOTE — Progress Notes (Signed)
Ambulated with assistance of one from room 321 to elevator. Tolerated activity well. Taken to NICU via w/c to visit infant.

## 2017-04-22 NOTE — Lactation Note (Signed)
This note was copied from a baby's chart. Lactation Consultation Note  Patient Name: Bonnie White Date: 04/22/2017 Reason for consult: Initial assessment Baby at 4 hr of life. Upon entry mom was sleeping. She opened her eyes as the DEBP was being pushed into the room. Mom stated she would like to offer breast milk to the baby but she is too tired to pump at this time. Offered to help mom express and she declined. She stated she needs rest. The DEBP was set up and left at the bedside. No bf ed was done.   Maternal Data Has patient been taught Hand Expression?: No  Feeding    LATCH Score                   Interventions    Lactation Tools Discussed/Used     Consult Status Consult Status: Follow-up Date: 04/23/17 Follow-up type: In-patient    [name] 04/22/2017, 11:31 AM

## 2017-04-22 NOTE — Transfer of Care (Signed)
Immediate Anesthesia Transfer of Care Note  Patient: GERIANN LAFONT  Procedure(s) Performed: Procedure(s): CESAREAN SECTION (N/A)  Patient Location: PACU  Anesthesia Type:General  Level of Consciousness: awake, alert  and oriented  Airway & Oxygen Therapy: Patient Spontanous Breathing and Patient connected to nasal cannula oxygen  Post-op Assessment: Report given to RN and Post -op Vital signs reviewed and stable  Post vital signs: Reviewed and stable  Last Vitals:  Vitals:   04/22/17 0652 04/22/17 0653  BP:    Pulse:    Resp:    Temp:    SpO2: 100% 100%    Last Pain:  Vitals:   04/22/17 0635  TempSrc: Oral         Complications: No apparent anesthesia complications

## 2017-04-22 NOTE — H&P (Signed)
OBSTETRIC ADMISSION HISTORY AND PHYSICAL  Bonnie White is a 31 y.o. female G3P2002 with IUP at [redacted]w[redacted]d by 7w [place] not c/w LMP presenting for IOL for polyhydramnios.   She reports +FMs, No LOF, no VB, no blurry vision, headaches or peripheral edema, and RUQ pain.  She plans on breast and bottle feeding. She request BTL for birth control.  She received her prenatal care at [facility].   Dating: By Alba Cory [place] --->  Estimated Date of Delivery: 04/29/17  Sono:   04/10/17 @ 37+1 cephalic, S>D, AFI 23.2, normal anatomy, 58th%, BPP 8/8 12/03/16 @ 19+0, normal anatomy, normal fluid  Prenatal History/Complications:  Past Medical History: Past Medical History:  Diagnosis Date  . Medical history non-contributory     Past Surgical History: Past Surgical History:  Procedure Laterality Date  . NO PAST SURGERIES    . WISDOM TOOTH EXTRACTION      Obstetrical History: OB History    Gravida Para Term Preterm AB Living   SAB TAB Ectopic Multiple Live Births         0 2      Social History: Social History   Social History  . Marital status: Married    Spouse name: N/A  . Number of children: N/A  . Years of education: N/A   Social History Main Topics  . Smoking status: Former Smoker    Packs/day: 8.00    Years: 1.00    Types: Cigarettes  . Smokeless tobacco: Never Used  . Alcohol use No  . Drug use: No  . Sexual activity: Yes    Partners: Male   Other Topics Concern  . Not on file   Social History Narrative  . No narrative on file    Family History: Family History  Problem Relation Age of Onset  . Ulcerative colitis Father   . Seizures Sister     Allergies: No Known Allergies  Prescriptions Prior to Admission  Medication Sig Dispense Refill Last Dose  . acetaminophen (TYLENOL) 325 MG tablet Take 650 mg by mouth every 6 (six) hours as needed for moderate pain.   Taking  . hydrocortisone (ANUSOL-HC) 2.5 % rectal cream Place 1 application rectally 2 (two) times daily.  30 g 0 Taking  . ondansetron (ZOFRAN) 4 MG tablet Take 1 tablet (4 mg total) by mouth every 8 (eight) hours as needed for nausea or vomiting. 20 tablet 0 Taking  . Prenatal Multivit-Min-Fe-FA (PRENATAL VITAMINS PO) Take 1 tablet by mouth daily.    Taking     Review of Systems   All systems reviewed and negative except as stated in HPI  Last menstrual period 07/01/2016, currently breastfeeding. General appearance: alert, cooperative and no distress Lungs: clear to auscultation bilaterally Heart: regular rate and rhythm Abdomen: soft, non-tender; bowel sounds normal Pelvic: 2 external and closed internal/long/high  Extremities: Homans sign is negative, no sign of DVT Presentation: cephalic Fetal monitoringBaseline: 115 bpm, Variability: Good {> 6 bpm), Accelerations: Reactive and Decelerations: Absent Uterine activity None    Prenatal labs: ABO, Rh: O/POS/-- (02/13 1447) Antibody: NEG (02/13 1447) Rubella: 2.26 (02/13 1447) RPR: NON REAC (02/13 1447)  HBsAg: NEGATIVE (02/13 1447)  HIV: NONREACTIVE (07/18 0946)  GBS:   Positive 1 hr Glucola elevated 2 hour 143 (<140), third trimester random glucose 88, A1c 4.9 Genetic screening  normal Anatomy US normal  Prenatal Transfer Tool  Maternal Diabetes: No - elevated 2 hour - random CBG  3rd trimester and A1c normal Genetic Screening: Normal Maternal Ultrasounds/Referrals: Normal Fetal Ultrasounds or other Referrals:  None Maternal Substance Abuse:  No Significant Maternal Medications:  None Significant Maternal Lab Results: None  Results for orders placed or performed during the hospital encounter of 04/22/17 (from the past 24 hour(s))  CBC   Collection Time: 04/22/17  1:18 AM  Result Value Ref Range   WBC 9.9 4.0 - 10.5 K/uL   RBC 3.56 (L) 3.87 - 5.11 MIL/uL   Hemoglobin 11.4 (L) 12.0 - 15.0 g/dL   HCT 16.1 (L) 09.6 - 04.5 %   MCV 92.4 78.0 - 100.0 fL   MCH 32.0 26.0 - 34.0 pg   MCHC 34.7 30.0 - 36.0 g/dL   RDW 40.9  81.1 - 91.4 %   Platelets 228 150 - 400 K/uL    Patient Active Problem List   Diagnosis Date Noted  . Transient hypertension of pregnancy 04/13/2017  . Supervision of normal pregnancy 09/16/2016  . Polyhydramnios affecting pregnancy in third trimester 04/23/2015  . Encounter for induction of labor     Assessment/Plan:  LAURA CALDAS is a 31 y.o. G3P2002 at [redacted]w[redacted]d here for IOL for polyhydramnios.  #Labor: patient had previously discussed pitocin with OP physician; will start at 2 and increase at rate of 2. #Polyhydramnios: last NST 04/16/17 with AFI 26.77 #Transient HTN during pregnancy #Pain: Considering epidural #FWB: Category 1 - overall reassuring #ID: GBS positive #MOF: breast and bottle #MOC: BTL #Circ:  N/A  Burnard Leigh, MD  04/22/2017, 2:11 AM  Midwife attestation: I have seen and examined this patient; I agree with above documentation in the resident's note.   NATHALIE CAVENDISH is a 31 y.o. [phone] here for IOL for polyhydramnios  PE: Gen: calm comfortable, NAD Resp: normal effort, no distress Abd: gravid  ROS, labs, PMH reviewed  Assessment/Plan: Admit to LD Labor: IOL FWB: Cat I ID: GBS pos >PCN  Donette Larry, CNM  04/22/2017, 3:14 AM

## 2017-04-22 NOTE — Op Note (Addendum)
Bonnie White PROCEDURE DATE: 04/22/2017  PREOPERATIVE DIAGNOSIS: Intrauterine pregnancy at  [redacted]w[redacted]d weeks gestation; non-reassuring fetal status  POSTOPERATIVE DIAGNOSIS: The same  PROCEDURE: Emergent Primary Low Transverse Cesarean Section with BTL  SURGEON:  Dr. Candelaria Celeste  ASSISTANT: Genice Rouge, RNFA  INDICATIONS: Bonnie White is a 31 y.o. [id number] at [redacted]w[redacted]d scheduled for cesarean section secondary to non-reassuring fetal status.  The risks of cesarean section discussed with the patient included but were not limited to: bleeding which may require transfusion or reoperation; infection which may require antibiotics; injury to bowel, bladder, ureters or other surrounding organs; injury to the fetus; need for additional procedures including hysterectomy in the event of a life-threatening hemorrhage; placental abnormalities wth subsequent pregnancies, incisional problems, thromboembolic phenomenon and other postoperative/anesthesia complications. The patient concurred with the proposed plan, giving informed written consent for the procedure.    FINDINGS:  Viable female infant in vertex presentation with a tight triple body cord and a leg cord.  Apgars 0, 3, 5. Weight: 7 pounds and 9 ounces.  Clear amniotic fluid.  Intact placenta, three vessel cord.  Normal uterus, fallopian tubes and ovaries bilaterally. Cord pH: 6.99. Corrected pH: 7.39.  ANESTHESIA:    Spinal INTRAVENOUS FLUIDS:1800 ml ESTIMATED BLOOD LOSS: 900 ml URINE OUTPUT:  100 ml SPECIMENS: Placenta sent to pathology COMPLICATIONS: None immediate  PROCEDURE IN DETAIL:  I was called to the patient's room due to fetal bradycardia. On my arrival, patient on hands and knees, scalp electrode on and AROM with clear fluid. Patient rolled to hands and knees - O2 on, fluid bolus running. Due to persistent fetal bradycardia, emergent cesarean section was called. Due to the emergent nature, the patient was consented verbally and  patient's desire for BTL was confirmed. The patient was taken to the operating room, The patient received intravenous antibiotics and had sequential compression devices applied to her lower extremities while in the preoperative area. She was then placed in a dorsal supine position with a leftward tilt, and prepped and draped in a sterile manner.  A foley catheter was placed into her bladder and attached to constant gravity, which drained clear fluid throughout. General anesthesia was then administered. A Pfannenstiel skin incision was made with scalpel and carried through to the underlying layer of fascia. The fascia was incised in the midline and this incision was extended bilaterally bluntly. The superior aspect of the fascial incision was dissected from the underlying rectus muscles bluntly. The rectus muscles were separated in the midline bluntly and the peritoneum was entered bluntly. A bladder blade was placed. Attention was turned to the lower uterine segment where a transverse hysterotomy was made with a scalpel and extended bilaterally bluntly. The infant was successfully delivered, and cord was clamped and cut and infant was handed over to awaiting neonatology team. Uterine massage was then administered and the placenta delivered intact with three-vessel cord. The uterus was externalized. The uterus was then cleared of clot and debris.  The hysterotomy was closed with 0 Vicryl in a running locked fashion, and an imbricating layer was also placed with a 0 Vicryl. Overall, excellent hemostasis was noted.  Attention was then turned to the fallopian tubes.  A Filshie clip was placed on both tubes, about 3 cm from the cornua, with care given to incorporate the underlying mesosalpinx on both sides, allowing for bilateral tubal sterilization. The uterus was returned to the abdomen. The abdomen and the pelvis were cleared of all clot and debris. Hemostasis was confirmed on  all surfaces.  The peritoneum was  reapproximated using 2-0 vicryl running stitches. The fascia was then closed using 0 Vicryl in a running fashion. The skin was closed with 4-0 vicryl. The patient tolerated the procedure well. Sponge, lap, instrument and needle counts were correct x 2. She was taken to the recovery room in stable condition.    Bonnie Heritage, DO 04/22/2017 8:12 AM

## 2017-04-22 NOTE — Anesthesia Procedure Notes (Signed)
Performed by: Erminia Mcnew L       

## 2017-04-22 NOTE — Progress Notes (Signed)
Called by RN for NRFHT. Upon entry to room FHR noted to have repetaitve variables with nadir into 50-60s. Attempt to AROM with FSE unsuccessful.  AROM with amnihook for clear fluid and FSE placed. Dr. Adrian Blackwater notified. FHR in 40s and Dr. Adrian Blackwater at bedside. To OR for stat CS.

## 2017-04-22 NOTE — Anesthesia Preprocedure Evaluation (Signed)
Anesthesia Evaluation  Patient identified by MRN, date of birth, ID band Patient awake    Reviewed: Allergy & Precautions, H&P , NPO status , Patient's Chart, lab work & pertinent test results, reviewed documented beta blocker date and time   Airway Mallampati: II  TM Distance: >3 FB Neck ROM: full    Dental no notable dental hx.    Pulmonary neg pulmonary ROS, former smoker,    Pulmonary exam normal breath sounds clear to auscultation       Cardiovascular Exercise Tolerance: Good negative cardio ROS   Rhythm:regular Rate:Normal     Neuro/Psych negative neurological ROS  negative psych ROS   GI/Hepatic negative GI ROS, Neg liver ROS,   Endo/Other  negative endocrine ROS  Renal/GU negative Renal ROS  negative genitourinary   Musculoskeletal   Abdominal   Peds  Hematology negative hematology ROS (+)   Anesthesia Other Findings   Reproductive/Obstetrics negative OB ROS                             Anesthesia Physical Anesthesia Plan  ASA: II and emergent  Anesthesia Plan: General   Post-op Pain Management:    Induction: Cricoid pressure planned  PONV Risk Score and Plan: 2 and 3 and Ondansetron, Dexamethasone, Treatment may vary due to age or medical condition, Midazolam and Scopolamine patch - Pre-op  Airway Management Planned: Oral ETT  Additional Equipment:   Intra-op Plan:   Post-operative Plan: Extubation in OR  Informed Consent: I have reviewed the patients History and Physical, chart, labs and discussed the procedure including the risks, benefits and alternatives for the proposed anesthesia with the patient or authorized representative who has indicated his/her understanding and acceptance.     Plan Discussed with: CRNA  Anesthesia Plan Comments:         Anesthesia Quick Evaluation

## 2017-04-22 NOTE — Anesthesia Postprocedure Evaluation (Signed)
Anesthesia Post Note  Patient: Bonnie White  Procedure(s) Performed: Procedure(s) (LRB): CESAREAN SECTION (N/A)     Patient location during evaluation: [facility] Anesthesia Type: General Level of consciousness: awake and alert and oriented Pain management: pain level controlled Vital Signs Assessment: post-procedure vital signs reviewed and stable Respiratory status: spontaneous breathing, nonlabored ventilation and patient connected to nasal cannula oxygen Cardiovascular status: stable Postop Assessment: no apparent nausea or vomiting and adequate PO intake Anesthetic complications: no    Last Vitals:  Vitals:   04/22/17 1200 04/22/17 1300  BP: 128/83 135/83  Pulse: 63 63  Resp: 16 (!) 22  Temp: 36.8 C 37 C  SpO2: 100% 100%    Last Pain:  Vitals:   04/22/17 1300  TempSrc: Oral  PainSc:    Pain Goal: Patients Stated Pain Goal: 4 (04/22/17 1000)               Marria Mathison

## 2017-04-23 ENCOUNTER — Encounter (HOSPITAL_COMMUNITY): Payer: Self-pay

## 2017-04-23 LAB — CBC
HEMATOCRIT: 25.8 % — AB (ref 36.0–46.0)
HEMOGLOBIN: 8.7 g/dL — AB (ref 12.0–15.0)
MCH: 31.5 pg (ref 26.0–34.0)
MCHC: 33.7 g/dL (ref 30.0–36.0)
MCV: 93.5 fL (ref 78.0–100.0)
Platelets: 179 10*3/uL (ref 150–400)
RBC: 2.76 MIL/uL — AB (ref 3.87–5.11)
RDW: 13.6 % (ref 11.5–15.5)
WBC: 7.2 10*3/uL (ref 4.0–10.5)

## 2017-04-23 MED ORDER — INFLUENZA VAC SPLIT QUAD 0.5 ML IM SUSY
0.5000 mL | PREFILLED_SYRINGE | INTRAMUSCULAR | Status: AC
  Administered 2017-04-24: 0.5 mL via INTRAMUSCULAR

## 2017-04-23 NOTE — Lactation Note (Signed)
This note was copied from a baby's chart. Lactation Consultation Note  Patient Name: Bonnie White VSYVG'C Date: 04/23/2017 Reason for consult: Follow-up assessment;NICU baby  NICU baby 69 hours old. Mom reports that she has not used DEBP, but is wanting to start. Assisted mom with assembly, disassembly and discussed washing of pump parts. Enc mom to pump every 2-3 hours for a total of 8-12 times/24 hours. Mom aware of benefits of hospital-grade pump, rental pumps and pumping rooms in NICU. Enc mom to take pumping kit at D/C. Discussed EBM storage guidelines, and how to transport to NICU. Mom aware of labeling process as well. Mom given NICU booklet with review and enc to offer lots of STS and nuzzling/latching as she and baby able.   Maternal Data Has patient been taught Hand Expression?: Yes (Per mom.) Does the patient have breastfeeding experience prior to this delivery?: Yes  Feeding    LATCH Score                   Interventions    Lactation Tools Discussed/Used Tools: Pump Breast pump type: Double-Electric Breast Pump Pump Review: Setup, frequency, and cleaning;Milk Storage Initiated by:: JW Date initiated:: 04/23/17   Consult Status Consult Status: Follow-up Date: 04/24/17 Follow-up type: In-patient    Andres Labrum 04/23/2017, 1:18 PM

## 2017-04-23 NOTE — Progress Notes (Signed)
PCA discontinued per order. Pt understands that she may have oral pain medications. Carmelina Dane, RN

## 2017-04-23 NOTE — Progress Notes (Signed)
1610 cytotec placed vaginally by Billy Fischer RN. [phone] Adjusting cardio due to loss of signal.  0632 FHR 45bpm on external cardio. 5409 Position changes left lateral and then right and IV bolus initiated. 8119 Oxygen applied at 10l/min and Donette Larry called to room. 1478 Donette Larry CNM  at bedside [phone] SVE by Fabian November CNM. Attempt to AROM with FSE. Unsuccessful.  2130 AROM. Placement of FSE. Cytotec came out with fluid.  8657 FSE applied. FHR 55. Dr. Adrian Blackwater called to bedside.  8469 Dr Adrian Blackwater at bedside Code cesarean called. 0651 FHR 45. Monitor off. To OR. [id number]. FSE resumed in OR with FHR 43.  0654 FHR 40. FSE removed for drapping.

## 2017-04-24 DIAGNOSIS — Z9851 Tubal ligation status: Secondary | ICD-10-CM

## 2017-04-24 DIAGNOSIS — Z98891 History of uterine scar from previous surgery: Secondary | ICD-10-CM

## 2017-04-24 HISTORY — DX: Tubal ligation status: Z98.51

## 2017-04-24 HISTORY — DX: History of uterine scar from previous surgery: Z98.891

## 2017-04-24 MED ORDER — OXYCODONE-ACETAMINOPHEN 5-325 MG PO TABS: 1.0000 | ORAL_TABLET | Freq: Four times a day (QID) | ORAL | 0 refills | Status: DC | PRN

## 2017-04-24 MED ORDER — SENNOSIDES-DOCUSATE SODIUM 8.6-50 MG PO TABS: 2.0000 | ORAL_TABLET | Freq: Every day | ORAL | 1 refills | Status: DC

## 2017-04-24 MED ORDER — IBUPROFEN 600 MG PO TABS: 600.0000 mg | ORAL_TABLET | Freq: Four times a day (QID) | ORAL | 0 refills | Status: DC

## 2017-04-24 NOTE — Discharge Summary (Signed)
OB Discharge Summary     Patient Name: Bonnie White DOB: 1986-03-21 MRN: [id number]  Date of admission: 04/22/2017 Delivering MD: Levie Heritage   Date of discharge: 04/24/2017  Admitting diagnosis: INDUCTION Intrauterine pregnancy: [redacted]w[redacted]d     Secondary diagnosis:  Active Problems:   Encounter for induction of labor   Polyhydramnios affecting pregnancy in third trimester   Supervision of normal pregnancy   Transient hypertension of pregnancy   Status post tubal ligation   Status post primary low transverse cesarean section  Additional problems: None   Discharge diagnosis: Term Pregnancy Delivered                                                                                                Post partum procedures:postpartum tubal ligation  Augmentation: AROM and Pitocin  Complications: None  Hospital course:  Induction of Labor With Cesarean Section  31 y.o. yo [id number] at [redacted]w[redacted]d was admitted to the hospital 04/22/2017 for induction of labor. Patient had a labor course significant for polyhydraminos. The patient went for cesarean section due to Non-Reassuring FHR, and delivered a Viable infant,@BABYSUPPRESS (DBLINK,ept,110,,1,,) Membrane Rupture Time/Date: )6:44 AM ,04/22/2017    of operation can be found in separate operative Note.  Patient had an uncomplicated postpartum course. Baby is in the NICU. Mom's PP depression screen positive. Should reassess postpartum. SW aware. She is ambulating, tolerating a regular diet, passing flatus, and urinating well.  Patient is discharged home in stable condition on 04/24/2017                       Physical exam  Vitals:   04/23/17 0852 04/23/17 1245 04/23/17 1934 04/24/17 0637  BP: 124/75 112/68 129/88 115/63  Pulse: 63 86 87 (!) 57  Resp: Temp: 98.7 F (37.1 C) 98.7 F (37.1 C) 97.7 F (36.5 C) 97.8 F (36.6 C)  TempSrc: Oral Oral Oral Oral  SpO2: 99% 98%     General: alert, cooperative and no  distress Lochia: appropriate Uterine Fundus: firm Incision: Dressing is clean, dry, and intact DVT Evaluation: No evidence of DVT seen on physical exam. Labs: Lab Results  Component Value Date   WBC 7.2 04/23/2017   HGB 8.7 (L) 04/23/2017   HCT 25.8 (L) 04/23/2017   MCV 93.5 04/23/2017   PLT 179 04/23/2017   CMP Latest Ref Rng & Units 09/16/2016  Glucose 65 - 99 mg/dL 88    Discharge instruction: per After Visit Summary and "Baby and Me Booklet".  After visit meds:  Allergies as of 04/24/2017   No Known Allergies     Medication List    TAKE these medications   calcium carbonate 500 MG chewable tablet Commonly known as:  TUMS - dosed in mg elemental calcium Chew 1-2 tablets by mouth daily.   ibuprofen 600 MG tablet Commonly known as:  ADVIL,MOTRIN Take 1 tablet (600 mg total) by mouth every 6 (six) hours.   oxyCODONE-acetaminophen 5-325 MG tablet Commonly known as:  PERCOCET/ROXICET Take 1 tablet by mouth every 6 (six) hours as needed (pain scale 4-7).  PRENATAL VITAMINS PO Take 1 tablet by mouth daily.   senna-docusate 8.6-50 MG tablet Commonly known as:  Senokot-S Take 2 tablets by mouth at bedtime.            Discharge Care Instructions        Start     Ordered   04/24/17 0000  ibuprofen (ADVIL,MOTRIN) 600 MG tablet  Every 6 hours     04/24/17 1707   04/24/17 0000  oxyCODONE-acetaminophen (PERCOCET/ROXICET) 5-325 MG tablet  Every 6 hours PRN     04/24/17 1707   04/24/17 0000  senna-docusate (SENOKOT-S) 8.6-50 MG tablet  Daily at bedtime     04/24/17 1707   04/22/17 0000  OB RESULT CONSOLE Group B Strep    Comments:  This external order was created through the Results Console.   04/22/17 0415      Diet: routine diet  Activity: Advance as tolerated. Pelvic rest for 6 weeks.   Outpatient follow up:2 weeks Follow up Appt:Future Appointments Date Time Provider Department Center  06/03/2017 8:30 AM Allie Bossier, MD [facility]    Follow up Visit: Follow-up Information    Center for [facility] [place]. Schedule an appointment as soon as possible for a visit in 2 week(s).   Specialty:  Obstetrics and Gynecology Why:  Postpartum visit in 2 weeks. Contact information: [address], [address] [place] [address] [phone]          Postpartum contraception: Tubal Ligation  Newborn Data: Live born female  Birth Weight: 7 lb 9.7 oz (3450 g) APGAR: 0, 3  Baby Feeding: Bottle Disposition:NICU   04/24/2017 Caryl Ada, DO

## 2017-04-24 NOTE — Lactation Note (Signed)
Lactation Consultation Note  Patient Name: Bonnie White Date: 04/24/2017   Mom is a P3 who reports that her milk came to volume in a timely manner with her 1st child but may have taken 5-6 days with her 2nd child (now 31 yo).  Mom reported having an adequate supply with her last child, whom she still comfort nurses.   Mom is still in the colostral phase of lactation & she is hand-expressing & pumping regularly. She has no breast complaints. Proper separation of breast pumps when washing reviewed.  Mom is on enoxaparin  qd (L2).    Lurline Hare [facility] 04/24/2017, 2:14 PM

## 2017-04-24 NOTE — Progress Notes (Signed)
Patient decided this PM that she wanted to be discharged to home tonight. She said she has visited with her baby several times in the NICU today and is feeling very emotional and tired because she can't hold her baby (due to medical therapy), sleeping poorly and missing other children at home. Patient states she  completed her Inocente Salles score based on her feelings today and got a 13. Patient states the score is due to her baby being in the NICU. "I will feel better if I can just go home, sit on my porch, and rest." She denies any concerns of harming herself or feeling depressed for any other reason otherthan her baby in the NICU for her score. Attempted to contact Clinical Social Worker but they are gone for the day. MSW has made attempts to see the patient today but missed her due to patient out of her room. MSW note indicates she will follow up with the patient in the NICU. Discussed score with Dr. Doroteo Glassman and she feels the patient has been clinically sound and appropriate with emotions due to the baby in the NICU and approves for the patient to be discharged to home with her family. The patient does not want to stay to see a Child psychotherapist. Patient states she will ask to see the social worker tomorrow when she comes to see her baby in the NICU. Patient was discharged in positive spirits, smiling and accompanied by her husband. Husband was present for conversations of Edinburgh score of 13. Patient denies any past history of depression or PPD with other births.

## 2017-04-24 NOTE — Progress Notes (Signed)
CSW attempted to meet with MOB to offer support and complete assessment, but she was not in her room at this time.  CSW will attempt again at a later time. 

## 2017-04-24 NOTE — Progress Notes (Signed)
Post Operative Day 2  Subjective:  Bonnie White is a 31 y.o. [id number] [redacted]w[redacted]d s/p LTCS and BTL.  No acute events overnight.  Pt denies problems with ambulating, voiding or po intake.  She denies nausea or vomiting.  Pain is well controlled.  She has had flatus. She has not had bowel movement.  Lochia Moderate.  Plan for birth control is bilateral tubal ligation.  Method of Feeding: breast  Objective: BP 115/63 (BP Location: Right Arm)   Pulse (!) 57   Temp 97.8 F (36.6 C) (Oral)   Resp 18   LMP 07/01/2016   SpO2 98%   Breastfeeding? Unknown   Physical Exam:  General: alert, cooperative and no distress Lochia:normal flow Chest: no increased work of breathing Abdomen: +BS, soft, nontender, fundus firm at/below umbilicus Uterine Fundus: firm DVT Evaluation: No evidence of DVT seen on physical exam. Extremities: No edema   Recent Labs  04/22/17 0118 04/23/17 0552  HGB 11.4* 8.7*  HCT 32.9* 25.8*    Assessment/Plan:  ASSESSMENT: Bonnie White is a 31 y.o. G3P3002 [redacted]w[redacted]d pod #2 s/p LTCS and BTL doing well.   Plan for discharge tomorrow   Patient would like to stay today since her baby is in the NICU.   LOS: 2 days   Amanda C.  [place], MD PGY-1, [facility] 04/24/2017 7:49 AM  I have seen and examined this patient and agree with the management plan.

## 2017-04-24 NOTE — Progress Notes (Signed)
CSW attempted to meet with MOB to offer support and complete assessment due to baby's admission to NICU, but she was not in her room at this time.  CSW will attempt again at a later time. 

## 2017-04-24 NOTE — Discharge Instructions (Signed)

## 2017-04-28 ENCOUNTER — Encounter (HOSPITAL_COMMUNITY): Payer: Self-pay

## 2017-04-30 ENCOUNTER — Inpatient Hospital Stay (HOSPITAL_COMMUNITY)

## 2017-04-30 ENCOUNTER — Encounter (HOSPITAL_COMMUNITY): Payer: Self-pay

## 2017-04-30 ENCOUNTER — Inpatient Hospital Stay (HOSPITAL_COMMUNITY)
Admission: AD | Admit: 2017-04-30 | Discharge: 2017-05-02 | DRG: 769 | Disposition: A | Discharge status: Home / Self Care | Source: Ambulatory Visit | Attending: Obstetrics & Gynecology | Admitting: Obstetrics & Gynecology

## 2017-04-30 DIAGNOSIS — Z87891 Personal history of nicotine dependence: Secondary | ICD-10-CM | POA: Diagnosis not present

## 2017-04-30 DIAGNOSIS — S301XXA Contusion of abdominal wall, initial encounter: Secondary | ICD-10-CM | POA: Diagnosis present

## 2017-04-30 DIAGNOSIS — O860 Infection of obstetric surgical wound: Secondary | ICD-10-CM | POA: Diagnosis not present

## 2017-04-30 DIAGNOSIS — O902 Hematoma of obstetric wound: Secondary | ICD-10-CM | POA: Diagnosis present

## 2017-04-30 LAB — COMPREHENSIVE METABOLIC PANEL
ALBUMIN: 3.1 g/dL — AB (ref 3.5–5.0)
ALK PHOS: 107 U/L (ref 38–126)
ALT: 17 U/L (ref 14–54)
ANION GAP: 8 (ref 5–15)
AST: 19 U/L (ref 15–41)
BUN: 11 mg/dL (ref 6–20)
CALCIUM: 8.6 mg/dL — AB (ref 8.9–10.3)
CHLORIDE: 105 mmol/L (ref 101–111)
CO2: 24 mmol/L (ref 22–32)
Creatinine, Ser: 0.61 mg/dL (ref 0.44–1.00)
GFR calc Af Amer: >60 mL/min (ref >60)
GFR calc non Af Amer: >60 mL/min (ref >60)
GLUCOSE: 85 mg/dL (ref 65–99)
Potassium: 4.6 mmol/L (ref 3.5–5.1)
SODIUM: 137 mmol/L (ref 135–145)
Total Bilirubin: 0.7 mg/dL (ref 0.3–1.2)
Total Protein: 7.5 g/dL (ref 6.5–8.1)

## 2017-04-30 LAB — CBC WITH DIFFERENTIAL/PLATELET
BASOS PCT: 0 %
Basophils Absolute: 0 10*3/uL (ref 0.0–0.1)
EOS ABS: 0.1 10*3/uL (ref 0.0–0.7)
Eosinophils Relative: 1 %
HCT: 30.1 % — ABNORMAL LOW (ref 36.0–46.0)
HEMOGLOBIN: 9.9 g/dL — AB (ref 12.0–15.0)
Lymphocytes Relative: 21 %
Lymphs Abs: 2.1 10*3/uL (ref 0.7–4.0)
MCH: 30.7 pg (ref 26.0–34.0)
MCHC: 32.9 g/dL (ref 30.0–36.0)
MCV: 93.5 fL (ref 78.0–100.0)
Monocytes Absolute: 0.4 10*3/uL (ref 0.1–1.0)
Monocytes Relative: 5 %
NEUTROS PCT: 73 %
Neutro Abs: 7.2 10*3/uL (ref 1.7–7.7)
PLATELETS: 420 10*3/uL — AB (ref 150–400)
RBC: 3.22 MIL/uL — AB (ref 3.87–5.11)
RDW: 13 % (ref 11.5–15.5)
WBC: 9.8 10*3/uL (ref 4.0–10.5)

## 2017-04-30 LAB — URINALYSIS, ROUTINE W REFLEX MICROSCOPIC
BACTERIA UA: NONE SEEN
BILIRUBIN URINE: NEGATIVE
Glucose, UA: NEGATIVE mg/dL
KETONES UR: NEGATIVE mg/dL
NITRITE: NEGATIVE
Protein, ur: NEGATIVE mg/dL
Specific Gravity, Urine: 1.004 — ABNORMAL LOW (ref 1.005–1.030)
pH: 7 (ref 5.0–8.0)

## 2017-04-30 LAB — PROTEIN / CREATININE RATIO, URINE
Creatinine, Urine: 24 mg/dL
PROTEIN CREATININE RATIO: 0.25 mg/mg{creat} — AB (ref 0.00–0.15)
Total Protein, Urine: 6 mg/dL

## 2017-04-30 LAB — INFLUENZA PANEL BY PCR (TYPE A & B)
INFLBPCR: NEGATIVE
Influenza A By PCR: NEGATIVE

## 2017-04-30 MED ORDER — PIPERACILLIN-TAZOBACTAM 3.375 G IVPB
3.3750 g | Freq: Three times a day (TID) | INTRAVENOUS | Status: DC
  Administered 2017-04-30 – 2017-05-02 (×5): 3.375 g via INTRAVENOUS
  Filled 2017-04-30 (×6): qty 50

## 2017-04-30 MED ORDER — LACTATED RINGERS IV SOLN
INTRAVENOUS | Status: DC
  Administered 2017-04-30 – 2017-05-01 (×3): via INTRAVENOUS

## 2017-04-30 MED ORDER — IOPAMIDOL (ISOVUE-300) INJECTION 61%
100.0000 mL | Freq: Once | INTRAVENOUS | Status: AC | PRN
  Administered 2017-04-30: 100 mL via INTRAVENOUS

## 2017-04-30 MED ORDER — LACTATED RINGERS IV BOLUS (SEPSIS)
1000.0000 mL | Freq: Once | INTRAVENOUS | Status: AC
  Administered 2017-04-30: 1000 mL via INTRAVENOUS

## 2017-04-30 MED ORDER — ONDANSETRON HCL 4 MG/2ML IJ SOLN
4.0000 mg | Freq: Four times a day (QID) | INTRAMUSCULAR | Status: DC | PRN
  Administered 2017-05-01: 4 mg via INTRAVENOUS

## 2017-04-30 MED ORDER — SODIUM CHLORIDE 0.9% FLUSH: 3.0000 mL | INTRAVENOUS | Status: DC | PRN

## 2017-04-30 MED ORDER — HYDROMORPHONE HCL 1 MG/ML IJ SOLN
0.2000 mg | INTRAMUSCULAR | Status: DC | PRN
  Administered 2017-05-01: 0.6 mg via INTRAVENOUS
  Filled 2017-04-30: qty 1

## 2017-04-30 MED ORDER — MAGNESIUM CITRATE PO SOLN: 1.0000 | Freq: Once | ORAL | Status: DC | PRN

## 2017-04-30 MED ORDER — ONDANSETRON HCL 4 MG PO TABS: 4.0000 mg | ORAL_TABLET | Freq: Four times a day (QID) | ORAL | Status: DC | PRN

## 2017-04-30 MED ORDER — VANCOMYCIN HCL IN DEXTROSE 1-5 GM/200ML-% IV SOLN
1000.0000 mg | Freq: Three times a day (TID) | INTRAVENOUS | Status: DC
  Administered 2017-04-30 – 2017-05-02 (×5): 1000 mg via INTRAVENOUS
  Filled 2017-04-30 (×6): qty 200

## 2017-04-30 MED ORDER — IBUPROFEN 600 MG PO TABS: 600.0000 mg | ORAL_TABLET | Freq: Four times a day (QID) | ORAL | Status: DC | PRN

## 2017-04-30 MED ORDER — MAGNESIUM HYDROXIDE 400 MG/5ML PO SUSP: 30.0000 mL | Freq: Every day | ORAL | Status: DC | PRN

## 2017-04-30 MED ORDER — SODIUM CHLORIDE 0.9% FLUSH
3.0000 mL | Freq: Two times a day (BID) | INTRAVENOUS | Status: DC
  Administered 2017-05-01: 3 mL via INTRAVENOUS

## 2017-04-30 MED ORDER — ZOLPIDEM TARTRATE 5 MG PO TABS: 5.0000 mg | ORAL_TABLET | Freq: Every evening | ORAL | Status: DC | PRN

## 2017-04-30 MED ORDER — LACTATED RINGERS IV SOLN
INTRAVENOUS | Status: DC
  Administered 2017-04-30: 17:00:00 via INTRAVENOUS

## 2017-04-30 MED ORDER — IOPAMIDOL (ISOVUE-300) INJECTION 61%
30.0000 mL | INTRAVENOUS | Status: DC
  Administered 2017-04-30: 30 mL via ORAL

## 2017-04-30 MED ORDER — OXYCODONE-ACETAMINOPHEN 5-325 MG PO TABS
1.0000 | ORAL_TABLET | ORAL | Status: DC | PRN
  Administered 2017-04-30: 1 via ORAL
  Filled 2017-04-30: qty 1

## 2017-04-30 MED ORDER — BISACODYL 5 MG PO TBEC
5.0000 mg | DELAYED_RELEASE_TABLET | Freq: Every day | ORAL | Status: DC | PRN
  Filled 2017-04-30: qty 1

## 2017-04-30 MED ORDER — HYDROMORPHONE HCL 1 MG/ML IJ SOLN
1.0000 mg | Freq: Once | INTRAMUSCULAR | Status: AC
  Administered 2017-04-30: 1 mg via INTRAVENOUS
  Filled 2017-04-30: qty 1

## 2017-04-30 MED ORDER — ALUM & MAG HYDROXIDE-SIMETH 200-200-20 MG/5ML PO SUSP: 30.0000 mL | ORAL | Status: DC | PRN

## 2017-04-30 MED ORDER — ONDANSETRON HCL 4 MG/2ML IJ SOLN
4.0000 mg | Freq: Once | INTRAMUSCULAR | Status: AC
  Administered 2017-04-30: 4 mg via INTRAVENOUS
  Filled 2017-04-30: qty 2

## 2017-04-30 MED ORDER — SODIUM CHLORIDE 0.9 % IV SOLN: 250.0000 mL | INTRAVENOUS | Status: DC | PRN

## 2017-04-30 MED ORDER — DOCUSATE SODIUM 100 MG PO CAPS
100.0000 mg | ORAL_CAPSULE | Freq: Two times a day (BID) | ORAL | Status: DC
  Administered 2017-04-30 – 2017-05-01 (×2): 100 mg via ORAL
  Filled 2017-04-30 (×2): qty 1

## 2017-04-30 NOTE — Progress Notes (Signed)
Pt states h/a, +floaters x1 month.  Denies epigastric pain.  No clonus, bilat patellar dtrs +2, no edema.   Small amt of serosang drainage noted in middle of incision site that has steristrips.  Bruising noted on side.  No redness, warmth at site.   FF 2 below umbilicus.  No vag bleeding noted at this time.

## 2017-04-30 NOTE — Progress Notes (Signed)
Pharmacy Antibiotic Note  Bonnie White is a 31 y.o. female admitted on 04/30/2017 with fever, headache, incisional pain and body aches.  Pharmacy has been consulted for Vancomycin dosing for wound infection s/p LTCS on 04-22-17. CT reveals post-op hematoma.   Plan: Vancomycin 1 gram IV q8h. Will continue to follow and draw Vanc levels ~ 4th dose.  Height:  (162.6 cm) Weight: 169 lb (76.7 kg) IBW/kg (Calculated) : 54.7  Temp (24hrs), Avg:99.4 F (37.4 C), Min:98.7 F (37.1 C), Max:99.7 F (37.6 C)   Recent Labs Lab 04/30/17 1150  WBC 9.8  CREATININE 0.61    Estimated Creatinine Clearance: 102.1 mL/min (by C-G formula based on SCr of 0.61 mg/dL).    No Known Allergies  Antimicrobials this admission: Zosyn 3.375 gram IV q8h with 4 hour infusion  Dose adjustments this admission:   Microbiology results: BCx:  UCx:  Sputum:  MRSA PCR:   Thank you for allowing pharmacy to be a part of this patient's care.  Claybon Jabs 04/30/2017 5:03 PM

## 2017-04-30 NOTE — MAU Note (Signed)
Pt reports she started having body aches last pm, awakened this am with a fever, body aches and her incision is bleeding. S/p C/S on 09/19.

## 2017-04-30 NOTE — MAU Provider Note (Signed)
History     CSN: [id number]  Arrival date and time: 04/30/17 1050  First Provider Initiated Contact with Patient 04/30/17 1153      Chief Complaint  Patient presents with  . Fever  . Post-op Problem  . Abdominal Pain   HPI  Bonnie White is a 31 y.o. [phone] who presents 1 week s/p cesarean with complaints of fever, headache, incisional pain, & body aches. Patient had emergent primary c/section for bradycardia on 9/19. Was discharged from the hospital on 9/21; baby remains in NICU. Symptoms began last night. Reports return of incisional/abdominal pain since last night that initially felt like burning and now describes as sore. Rates pain 7/10. Has taken percocet & ibuprofen without relief. Also noted some blood seeping from the middle of her incision, soaking her steri strips.  This morning felt bad, with headache & body aches. Took temp, was 100.3. Rates headache & body aches 4/10. Transient hypertension in this pregnancy. Reports blurred vision that has been ongoing issue for the last few months.   Denies cough, SOB, sore throat, epigastric pain, or breast pain.    OB History    Gravida Para Term Preterm AB Living   SAB TAB Ectopic Multiple Live Births         0 3      Past Medical History:  Diagnosis Date  . Medical history non-contributory     Past Surgical History:  Procedure Laterality Date  . CESAREAN SECTION N/A 04/22/2017   Procedure: CESAREAN SECTION;  Surgeon: Levie Heritage, DO;  Location: [facility];  Service: Obstetrics;  Laterality: N/A;  . WISDOM TOOTH EXTRACTION      Family History  Problem Relation Age of Onset  . Ulcerative colitis Father   . Seizures Sister     Social History  Substance Use Topics  . Smoking status: Former Smoker    Packs/day: 8.00    Years: 1.00    Types: Cigarettes  . Smokeless tobacco: Never Used  . Alcohol use No    Allergies: No Known Allergies  Prescriptions Prior to Admission  Medication  Sig Dispense Refill Last Dose  . calcium carbonate (TUMS - DOSED IN MG ELEMENTAL CALCIUM) 500 MG chewable tablet Chew 1-2 tablets by mouth daily.   04/21/2017 at Unknown time  . ibuprofen (ADVIL,MOTRIN) 600 MG tablet Take 1 tablet (600 mg total) by mouth every 6 (six) hours. 30 tablet 0   . oxyCODONE-acetaminophen (PERCOCET/ROXICET) 5-325 MG tablet Take 1 tablet by mouth every 6 (six) hours as needed (pain scale 4-7). 15 tablet 0   . Prenatal Multivit-Min-Fe-FA (PRENATAL VITAMINS PO) Take 1 tablet by mouth daily.    04/21/2017 at Unknown time  . senna-docusate (SENOKOT-S) 8.6-50 MG tablet Take 2 tablets by mouth at bedtime. 30 tablet 1     Review of Systems  Constitutional: Positive for chills and fever.  HENT: Negative for sore throat.   Eyes: Positive for visual disturbance.  Respiratory: Negative.  Negative for cough and shortness of breath.   Cardiovascular: Negative for chest pain and leg swelling.  Gastrointestinal: Positive for abdominal pain. Negative for constipation, diarrhea, nausea and vomiting.  Genitourinary: Positive for vaginal bleeding and vaginal discharge. Negative for dysuria.  Musculoskeletal: Positive for myalgias. Negative for back pain.  Skin: Positive for wound.  Neurological: Positive for headaches.   Physical Exam   Blood pressure 139/75, pulse 73, temperature 99.7 F (37.6 C), temperature source Oral,  resp. rate 18, height  (1.626 m), weight 169 lb (76.7 kg), SpO2 100 %, unknown if currently breastfeeding. Patient Vitals for the past 24 hrs:  BP Temp Temp src Pulse Resp SpO2 Height Weight  04/30/17 1524 139/75 99.7 F (37.6 C) Oral 73 18 - - -  04/30/17 1346 (!) 112/58 - - 75 - - - -  04/30/17 1331 137/80 - - 84 - - - -  04/30/17 1316 (!) 143/80 - - 87 - - - -  04/30/17 1301 132/82 - - 83 - - - -  04/30/17 1231 139/88 - - 93 - - - -  04/30/17 1216 (!) 146/89 - - 88 - - - -  04/30/17 1200 (!) 147/98 - - 93 - - - -  04/30/17 1146 133/84 - - (!) 103 - -  - -  04/30/17 1131 (!) 141/81 - - 87 - - - -  04/30/17 1118 (!) 137/92 - - 94 - - - -  04/30/17 1102 (!) 140/101 99.7 F (37.6 C) Oral (!) 103 19 100 %  (1.626 m) 169 lb (76.7 kg)    Physical Exam  Nursing note and vitals reviewed. Constitutional: She is oriented to person, place, and time. She appears well-developed and well-nourished. She has a sickly appearance. No distress.  HENT:  Head: Normocephalic and atraumatic.  Eyes: Conjunctivae are normal. Right eye exhibits no discharge. Left eye exhibits no discharge. No scleral icterus.  Neck: Normal range of motion.  Cardiovascular: Normal rate, regular rhythm and normal heart sounds.   No murmur heard. Respiratory: Effort normal and breath sounds normal. No respiratory distress. She has no wheezes. Right breast exhibits no mass, no skin change and no tenderness. Left breast exhibits no mass, no skin change and no tenderness.  GI: Soft. There is tenderness in the right lower quadrant, suprapubic area and left lower quadrant. There is no rigidity.  Diffuse ecchymosis throughout lower abdomen with induration. Blanching of tissue surrounding incision. Incision appears approximated with some bloody drainage.   Musculoskeletal: She exhibits no edema.  Neurological: She is alert and oriented to person, place, and time. She has normal reflexes.  Skin: Skin is warm and dry. She is not diaphoretic.  Lips dry/cracked  Psychiatric: She has a normal mood and affect. Her behavior is normal. Judgment and thought content normal.    MAU Course  Procedures Results for orders placed or performed during the hospital encounter of 04/30/17 (from the past 24 hour(s))  Urinalysis, Routine w reflex microscopic     Status: Abnormal   Collection Time: 04/30/17 11:01 AM  Result Value Ref Range   Color, Urine YELLOW YELLOW   APPearance CLEAR CLEAR   Specific Gravity, Urine 1.004 (L) 1.005 - 1.030   pH 7.0 5.0 - 8.0   Glucose, UA NEGATIVE NEGATIVE mg/dL    Hgb urine dipstick LARGE (A) NEGATIVE   Bilirubin Urine NEGATIVE NEGATIVE   Ketones, ur NEGATIVE NEGATIVE mg/dL   Protein, ur NEGATIVE NEGATIVE mg/dL   Nitrite NEGATIVE NEGATIVE   Leukocytes, UA MODERATE (A) NEGATIVE   RBC / HPF 6-30 0 - 5 RBC/hpf   WBC, UA 6-30 0 - 5 WBC/hpf   Bacteria, UA NONE SEEN NONE SEEN   Squamous Epithelial / LPF 0-5 (A) NONE SEEN  Protein / creatinine ratio, urine     Status: Abnormal   Collection Time: 04/30/17 11:01 AM  Result Value Ref Range   Creatinine, Urine 24.00 mg/dL   Total Protein, Urine 6 mg/dL  Protein Creatinine Ratio 0.25 (H) 0.00 - 0.15 mg/mg[Cre]  CBC with Differential/Platelet     Status: Abnormal   Collection Time: 04/30/17 11:50 AM  Result Value Ref Range   WBC 9.8 4.0 - 10.5 K/uL   RBC 3.22 (L) 3.87 - 5.11 MIL/uL   Hemoglobin 9.9 (L) 12.0 - 15.0 g/dL   HCT 45.4 (L) 09.8 - 11.9 %   MCV 93.5 78.0 - 100.0 fL   MCH 30.7 26.0 - 34.0 pg   MCHC 32.9 30.0 - 36.0 g/dL   RDW 14.7 82.9 - 56.2 %   Platelets 420 (H) 150 - 400 K/uL   Neutrophils Relative % 73 %   Neutro Abs 7.2 1.7 - 7.7 K/uL   Lymphocytes Relative 21 %   Lymphs Abs 2.1 0.7 - 4.0 K/uL   Monocytes Relative 5 %   Monocytes Absolute 0.4 0.1 - 1.0 K/uL   Eosinophils Relative 1 %   Eosinophils Absolute 0.1 0.0 - 0.7 K/uL   Basophils Relative 0 %   Basophils Absolute 0.0 0.0 - 0.1 K/uL  Comprehensive metabolic panel     Status: Abnormal   Collection Time: 04/30/17 11:50 AM  Result Value Ref Range   Sodium 137 135 - 145 mmol/L   Potassium 4.6 3.5 - 5.1 mmol/L   Chloride 105 101 - 111 mmol/L   CO2 24 22 - 32 mmol/L   Glucose, Bld 85 65 - 99 mg/dL   BUN 11 6 - 20 mg/dL   Creatinine, Ser 1.30 0.44 - 1.00 mg/dL   Calcium 8.6 (L) 8.9 - 10.3 mg/dL   Total Protein 7.5 6.5 - 8.1 g/dL   Albumin 3.1 (L) 3.5 - 5.0 g/dL   AST 19 15 - 41 U/L   ALT 17 14 - 54 U/L   Alkaline Phosphatase 107 38 - 126 U/L   Total Bilirubin 0.7 0.3 - 1.2 mg/dL   GFR calc non Af Amer >60 >60 mL/min    GFR calc Af Amer >60 >60 mL/min   Anion gap 8 5 - 15  Influenza panel by PCR (type A & B)     Status: None   Collection Time: 04/30/17 12:02 PM  Result Value Ref Range   Influenza A By PCR NEGATIVE NEGATIVE   Influenza B By PCR NEGATIVE NEGATIVE   Ct Abdomen Pelvis W Contrast  Result Date: 04/30/2017 CLINICAL DATA:  Post Caesarean section and tubal ligation on 04/21/2017, with pain, redness and bleeding with infection at incision site, fever EXAM: CT ABDOMEN AND PELVIS WITH CONTRAST TECHNIQUE: Multidetector CT imaging of the abdomen and pelvis was performed using the standard protocol following bolus administration of intravenous contrast. Sagittal and coronal MPR images reconstructed from axial data set. CONTRAST:  ISOVUE-300 IOPAMIDOL (ISOVUE-300) INJECTION 61% IV. Dilute oral contrast. COMPARISON:  None FINDINGS: Lower chest: Lung bases clear Hepatobiliary: Mass lesion RIGHT lobe liver near gallbladder fossa, 17 x 18 mm with early nodular peripheral enhancement and fill-in on delayed imaging likely representing a small hemangioma. Nonspecific low-attenuation focus lateral segment LEFT lobe liver 9 x 6 mm image 10. Gallbladder unremarkable. Pancreas: Normal appearance Spleen: Normal appearance Adrenals/Urinary Tract: Tiny nonobstructing calculus mid RIGHT kidney. Adrenal glands, kidneys, ureters, and bladder otherwise normal appearance. Stomach/Bowel: Normal appendix. Stomach and bowel loops unremarkable. Vascular/Lymphatic: Normal appearance Reproductive: Enlarged uterus compatible with postpartum state. Mildly prominent central low attenuation which may reflect endometrium or residual fluid. Question nabothian cysts at cervix. Unremarkable ovaries. Tubal ligation clips present bilaterally. Other: Large intermediate attenuation  subcutaneous collection in the anterior pelvic wall 15.4 x 5.4 x 4.4 cm in size likely representing a postoperative hematoma though cannot exclude infected hematoma by CT.  Scattered foci of postoperative gas in the anterior pelvis. No intraperitoneal hematoma, fluid collection or abscess identified. Scattered subcutaneous edema in the mid abdomen. No free intraperitoneal air or fluid. Musculoskeletal: Osseous structures unremarkable. IMPRESSION: Large intermediate attenuation subcutaneous collection in the anterior pelvis 15.4 x 5.4 x 4.4 cm in size most consistent with a postoperative hematoma though cannot exclude infected hematoma by CT. No intrapelvic abnormalities. Enlarged postpartum uterus. Probable small hepatic hemangioma 17 x 18 mm in size within additional nonspecific 9 mm LEFT lobe liver lesion. Tiny nonobstructing RIGHT renal calculus. Electronically Signed   By: Ulyses Southward M.D.   On: 04/30/2017 15:08    MDM Elevated BPs, none severe range. Will cycle BP & get labs.  IV LR bolus, zofran 4 mg, & dilaudid 1 mg IV for patient pain related to exam of abdomen Breast exam negative for signs of mastitis. Flu swab collected & negative.  Dr. Macon Large called to bedside to assess incision. Will send for CT abd/pelvis w/contrast.   Assessment and Plan  A: Post op hematoma  P; Dr. Macon Large on unit to discuss results of CT & plan of care with patient  Judeth Horn 04/30/2017, 11:53 AM

## 2017-05-01 ENCOUNTER — Inpatient Hospital Stay (HOSPITAL_COMMUNITY): Admitting: Anesthesiology

## 2017-05-01 ENCOUNTER — Encounter (HOSPITAL_COMMUNITY): Payer: Self-pay | Admitting: Anesthesiology

## 2017-05-01 ENCOUNTER — Encounter (HOSPITAL_COMMUNITY)
Admission: AD | Disposition: A | Discharge status: Home / Self Care | Payer: Self-pay | Source: Ambulatory Visit | Attending: Obstetrics & Gynecology

## 2017-05-01 DIAGNOSIS — O860 Infection of obstetric surgical wound: Secondary | ICD-10-CM

## 2017-05-01 HISTORY — PX: HEMATOMA EVACUATION: SHX5118

## 2017-05-01 LAB — CBC WITH DIFFERENTIAL/PLATELET
BASOS ABS: 0 10*3/uL (ref 0.0–0.1)
BASOS PCT: 0 %
Eosinophils Absolute: 0.1 10*3/uL (ref 0.0–0.7)
Eosinophils Relative: 2 %
HEMATOCRIT: 29.5 % — AB (ref 36.0–46.0)
Hemoglobin: 9.7 g/dL — ABNORMAL LOW (ref 12.0–15.0)
LYMPHS PCT: 31 %
Lymphs Abs: 2.1 10*3/uL (ref 0.7–4.0)
MCH: 30.7 pg (ref 26.0–34.0)
MCHC: 32.9 g/dL (ref 30.0–36.0)
MCV: 93.4 fL (ref 78.0–100.0)
Monocytes Absolute: 0.4 10*3/uL (ref 0.1–1.0)
Monocytes Relative: 5 %
NEUTROS ABS: 4.2 10*3/uL (ref 1.7–7.7)
Neutrophils Relative %: 62 %
PLATELETS: 400 10*3/uL (ref 150–400)
RBC: 3.16 MIL/uL — AB (ref 3.87–5.11)
RDW: 13 % (ref 11.5–15.5)
WBC: 6.7 10*3/uL (ref 4.0–10.5)

## 2017-05-01 SURGERY — EVACUATION HEMATOMA
Anesthesia: General | Site: Abdomen

## 2017-05-01 MED ORDER — OXYCODONE-ACETAMINOPHEN 5-325 MG PO TABS: 1.0000 | ORAL_TABLET | ORAL | Status: DC | PRN

## 2017-05-01 MED ORDER — LACTATED RINGERS IV SOLN: INTRAVENOUS | Status: DC

## 2017-05-01 MED ORDER — LIDOCAINE HCL (CARDIAC) 20 MG/ML IV SOLN
INTRAVENOUS | Status: AC
  Filled 2017-05-01: qty 5

## 2017-05-01 MED ORDER — DEXAMETHASONE SODIUM PHOSPHATE 10 MG/ML IJ SOLN
INTRAMUSCULAR | Status: DC | PRN
  Administered 2017-05-01: 8 mg via INTRAVENOUS

## 2017-05-01 MED ORDER — PROPOFOL 10 MG/ML IV BOLUS
INTRAVENOUS | Status: DC | PRN
  Administered 2017-05-01: 50 mg via INTRAVENOUS
  Administered 2017-05-01: 150 mg via INTRAVENOUS

## 2017-05-01 MED ORDER — METOCLOPRAMIDE HCL 5 MG/ML IJ SOLN: 10.0000 mg | Freq: Once | INTRAMUSCULAR | Status: DC | PRN

## 2017-05-01 MED ORDER — KETOROLAC TROMETHAMINE 30 MG/ML IJ SOLN
30.0000 mg | Freq: Four times a day (QID) | INTRAMUSCULAR | Status: DC
  Administered 2017-05-01 – 2017-05-02 (×3): 30 mg via INTRAVENOUS
  Filled 2017-05-01 (×2): qty 1

## 2017-05-01 MED ORDER — MEPERIDINE HCL 25 MG/ML IJ SOLN: 6.2500 mg | INTRAMUSCULAR | Status: DC | PRN

## 2017-05-01 MED ORDER — BUPIVACAINE HCL (PF) 0.5 % IJ SOLN
INTRAMUSCULAR | Status: DC | PRN
  Administered 2017-05-01: 30 mL

## 2017-05-01 MED ORDER — FENTANYL CITRATE (PF) 100 MCG/2ML IJ SOLN
INTRAMUSCULAR | Status: DC | PRN
  Administered 2017-05-01 (×3): 25 ug via INTRAVENOUS
  Administered 2017-05-01: 50 ug via INTRAVENOUS
  Administered 2017-05-01: 25 ug via INTRAVENOUS
  Administered 2017-05-01: 50 ug via INTRAVENOUS

## 2017-05-01 MED ORDER — ONDANSETRON HCL 4 MG/2ML IJ SOLN: 4.0000 mg | Freq: Four times a day (QID) | INTRAMUSCULAR | Status: DC | PRN

## 2017-05-01 MED ORDER — SCOPOLAMINE 1 MG/3DAYS TD PT72
1.0000 | MEDICATED_PATCH | Freq: Once | TRANSDERMAL | Status: DC
  Filled 2017-05-01: qty 1

## 2017-05-01 MED ORDER — GLYCOPYRROLATE 0.2 MG/ML IJ SOLN
INTRAMUSCULAR | Status: AC
  Filled 2017-05-01: qty 1

## 2017-05-01 MED ORDER — DEXAMETHASONE SODIUM PHOSPHATE 10 MG/ML IJ SOLN
INTRAMUSCULAR | Status: AC
  Filled 2017-05-01: qty 1

## 2017-05-01 MED ORDER — KETOROLAC TROMETHAMINE 30 MG/ML IJ SOLN
INTRAMUSCULAR | Status: AC
  Filled 2017-05-01: qty 1

## 2017-05-01 MED ORDER — LIDOCAINE HCL (CARDIAC) 20 MG/ML IV SOLN
INTRAVENOUS | Status: DC | PRN
  Administered 2017-05-01: 80 mg via INTRAVENOUS

## 2017-05-01 MED ORDER — FENTANYL CITRATE (PF) 250 MCG/5ML IJ SOLN
INTRAMUSCULAR | Status: AC
  Filled 2017-05-01: qty 5

## 2017-05-01 MED ORDER — BUPIVACAINE HCL (PF) 0.5 % IJ SOLN
INTRAMUSCULAR | Status: AC
  Filled 2017-05-01: qty 30

## 2017-05-01 MED ORDER — FENTANYL CITRATE (PF) 100 MCG/2ML IJ SOLN: 25.0000 ug | INTRAMUSCULAR | Status: DC | PRN

## 2017-05-01 MED ORDER — ONDANSETRON HCL 4 MG PO TABS: 4.0000 mg | ORAL_TABLET | Freq: Four times a day (QID) | ORAL | Status: DC | PRN

## 2017-05-01 MED ORDER — PROPOFOL 10 MG/ML IV BOLUS
INTRAVENOUS | Status: AC
  Filled 2017-05-01: qty 20

## 2017-05-01 MED ORDER — KETOROLAC TROMETHAMINE 30 MG/ML IJ SOLN
30.0000 mg | Freq: Four times a day (QID) | INTRAMUSCULAR | Status: DC
  Filled 2017-05-01: qty 1

## 2017-05-01 MED ORDER — MIDAZOLAM HCL 2 MG/2ML IJ SOLN
INTRAMUSCULAR | Status: AC
  Filled 2017-05-01: qty 2

## 2017-05-01 MED ORDER — ONDANSETRON HCL 4 MG/2ML IJ SOLN
INTRAMUSCULAR | Status: AC
  Filled 2017-05-01: qty 2

## 2017-05-01 MED ORDER — MIDAZOLAM HCL 2 MG/2ML IJ SOLN
INTRAMUSCULAR | Status: DC | PRN
  Administered 2017-05-01: 1 mg via INTRAVENOUS

## 2017-05-01 SURGICAL SUPPLY — 20 items
DRSG TELFA 3X8 NADH (GAUZE/BANDAGES/DRESSINGS) ×3 IMPLANT
DURAPREP 26ML APPLICATOR (WOUND CARE) ×3 IMPLANT
GAUZE SPONGE 4X4 12PLY STRL (GAUZE/BANDAGES/DRESSINGS) ×3 IMPLANT
GAUZE SPONGE 4X4 16PLY XRAY LF (GAUZE/BANDAGES/DRESSINGS) ×3 IMPLANT
GLOVE BIO SURGEON STRL SZ 6.5 (GLOVE) ×2 IMPLANT
GLOVE BIO SURGEONS STRL SZ 6.5 (GLOVE) ×1
GLOVE BIOGEL PI IND STRL 6.5 (GLOVE) ×1 IMPLANT
GLOVE BIOGEL PI IND STRL 7.0 (GLOVE) ×4 IMPLANT
GLOVE BIOGEL PI INDICATOR 6.5 (GLOVE) ×2
GLOVE BIOGEL PI INDICATOR 7.0 (GLOVE) ×8
NEEDLE HYPO 22GX1.5 SAFETY (NEEDLE) ×3 IMPLANT
NS IRRIG 1000ML POUR BTL (IV SOLUTION) ×3 IMPLANT
PACK ABDOMINAL GYN (CUSTOM PROCEDURE TRAY) ×3 IMPLANT
PAD ABD 8X7 1/2 STERILE (GAUZE/BANDAGES/DRESSINGS) ×3 IMPLANT
SUT PLAIN 2 0 (SUTURE) ×4
SUT PLAIN ABS 2-0 CT1 27XMFL (SUTURE) ×2 IMPLANT
SUT VIC AB 4-0 PS2 27 (SUTURE) ×3 IMPLANT
SYR BULB IRRIGATION 50ML (SYRINGE) ×3 IMPLANT
SYR CONTROL 10ML LL (SYRINGE) ×3 IMPLANT
TAPE HYPAFIX 4 X10 (GAUZE/BANDAGES/DRESSINGS) ×3 IMPLANT

## 2017-05-01 NOTE — Anesthesia Procedure Notes (Signed)
Procedure Name: LMA Insertion Date/Time: 05/01/2017 10:32 AM Performed by: Earmon Phoenix Pre-anesthesia Checklist: Patient identified, Emergency Drugs available, Suction available, Patient being monitored and Timeout performed Patient Re-evaluated:Patient Re-evaluated prior to induction Oxygen Delivery Method: Circle system utilized Preoxygenation: Pre-oxygenation with 100% oxygen Induction Type: IV induction Ventilation: Mask ventilation without difficulty LMA: LMA with gastric port inserted LMA Size: 3.0 Number of attempts: 1 Placement Confirmation: positive ETCO2,  CO2 detector and breath sounds checked- equal and bilateral Tube secured with: Tape Dental Injury: Teeth and Oropharynx as per pre-operative assessment

## 2017-05-01 NOTE — Progress Notes (Signed)
Subjective: Patient reports incisional pain and tolerating PO.    Objective: I have reviewed patient's vital signs, intake and output and radiology results. Blood pressure (!) 141/77, pulse 66, temperature 98.4 F (36.9 C), temperature source Oral, resp. rate 16, height  (1.626 m), weight 169 lb (76.7 kg), SpO2 98 %, unknown if currently breastfeeding.  General: alert, cooperative and no distress Extremities: extremities normal, atraumatic, no cyanosis or edema CT scan reviewed  Assessment/Plan: Subcutaneous incisional hematoma, posted for evacuation today, the procedure and risks were discussed and questions answered  LOS: 1 day    Scheryl Darter 05/01/2017, 9:32 AM

## 2017-05-01 NOTE — Transfer of Care (Signed)
Immediate Anesthesia Transfer of Care Note  Patient: Bonnie White  Procedure(s) Performed: Procedure(s): EVACUATION HEMATOMA (N/A)  Patient Location: PACU  Anesthesia Type:General  Level of Consciousness: awake, alert , oriented and patient cooperative  Airway & Oxygen Therapy: Patient Spontanous Breathing and Patient connected to nasal cannula oxygen  Post-op Assessment: Report given to RN and Post -op Vital signs reviewed and stable  Post vital signs: Reviewed and stable  Last Vitals:  Vitals:   05/01/17 0500 05/01/17 0840  BP: 135/90 (!) 141/77  Pulse: 70 66  Resp: 10 16  Temp: 36.7 C 36.9 C  SpO2: 98% 98%    Last Pain:  Vitals:   05/01/17 0930  TempSrc:   PainSc: 1       Patients Stated Pain Goal: 3 (04/30/17 1650)  Complications: No apparent anesthesia complications

## 2017-05-01 NOTE — Anesthesia Postprocedure Evaluation (Signed)
Anesthesia Post Note  Patient: Bonnie White  Procedure(s) Performed: Procedure(s) (LRB): EVACUATION HEMATOMA (N/A)     Patient location during evaluation: [facility] Anesthesia Type: General Level of consciousness: awake and alert and oriented Pain management: satisfactory to patient Vital Signs Assessment: post-procedure vital signs reviewed and stable Respiratory status: spontaneous breathing and respiratory function stable Cardiovascular status: stable Postop Assessment: adequate PO intake and no apparent nausea or vomiting Anesthetic complications: no    Last Vitals:  Vitals:   05/01/17 1214 05/01/17 1230  BP: 137/79 131/80  Pulse:  (!) 54  Resp: 16 18  Temp: 37.2 C 36.8 C  SpO2: 98% 98%    Last Pain:  Vitals:   05/01/17 1230  TempSrc:   PainSc: 0-No pain   Pain Goal: Patients Stated Pain Goal: 3 (04/30/17 1650)               Andreu Drudge

## 2017-05-01 NOTE — Op Note (Signed)
Procedure Note   Bonnie White  04/30/2017 - 05/01/2017  Indications: Subcutaneous incisional hematoma  Pre-operative Diagnosis: subcutaneous hematoma post cesarean section.   Post-operative Diagnosis: Same   Surgeon: Surgeon(s) and Role:    Adam Phenix, MD - Primary   Assistants: none  Anesthesia: LMA   Procedure Details:  The patient was seen in the Holding Room. The risks, benefits, complications, treatment options, and expected outcomes were discussed with the patient. The patient concurred with the proposed plan, giving informed consent. identified as Bonnie White and the procedure verified as exploration and evacuation of abdominal incision. A Time Out was held and the above information confirmed. After induction of anesthesia, the patient was draped and prepped in the usual sterile manner. The incision was partially open and dark hematoma was noted throughout the subcutaneous space. The incision was completely opened. Evacuation was performed and the wound was irrigated. Healthy tissue was noted. The subcutaneous layer was closed with interrupted 2-0 plain gut.   Hemostasis was observed. The skin was closed with 4-0 Vicryl. Sterile dressing was applied  Instrument, sponge, and needle counts were correct prior the abdominal closure and were correct at the conclusion of the case.    Findings: subcutaneous hematoma   Estimated Blood Loss: 200 ml clot  Total IV Fluids: 500 ml   Urine Output: 100CC OF clear urine  Complications: no complications  Disposition: PACU - hemodynamically stable.   Condition: stable   Attending Attestation: I was present and scrubbed for the entire procedure.   Signed: Surgeon(s): Adam Phenix, MD

## 2017-05-01 NOTE — Anesthesia Preprocedure Evaluation (Signed)
Anesthesia Evaluation  Patient identified by MRN, date of birth, ID band Patient awake    Reviewed: Allergy & Precautions, NPO status , Patient's Chart, lab work & pertinent test results  Airway Mallampati: II  TM Distance: >3 FB Neck ROM: Full    Dental no notable dental hx.    Pulmonary neg pulmonary ROS, former smoker,    Pulmonary exam normal breath sounds clear to auscultation       Cardiovascular negative cardio ROS Normal cardiovascular exam Rhythm:Regular Rate:Normal     Neuro/Psych negative neurological ROS  negative psych ROS   GI/Hepatic negative GI ROS, Neg liver ROS,   Endo/Other  negative endocrine ROS  Renal/GU negative Renal ROS  negative genitourinary   Musculoskeletal negative musculoskeletal ROS (+)   Abdominal   Peds negative pediatric ROS (+)  Hematology negative hematology ROS (+)   Anesthesia Other Findings   Reproductive/Obstetrics negative OB ROS                             Anesthesia Physical Anesthesia Plan  ASA: II  Anesthesia Plan: General   Post-op Pain Management:    Induction: Intravenous  PONV Risk Score and Plan: 4 or greater and Ondansetron, Dexamethasone, Midazolam and Treatment may vary due to age or medical condition  Airway Management Planned: LMA  Additional Equipment:   Intra-op Plan:   Post-operative Plan: Extubation in OR  Informed Consent: I have reviewed the patients History and Physical, chart, labs and discussed the procedure including the risks, benefits and alternatives for the proposed anesthesia with the patient or authorized representative who has indicated his/her understanding and acceptance.   Dental advisory given  Plan Discussed with: CRNA  Anesthesia Plan Comments:         Anesthesia Quick Evaluation

## 2017-05-02 ENCOUNTER — Encounter (HOSPITAL_COMMUNITY): Payer: Self-pay | Admitting: Obstetrics & Gynecology

## 2017-05-02 ENCOUNTER — Ambulatory Visit: Payer: Self-pay

## 2017-05-02 NOTE — Lactation Note (Signed)
This note was copied from a baby's chart. Lactation Consultation Note  Patient Name: Bonnie White Date: 05/02/2017 Reason for consult: Initial assessment  Baby 69 days old. Mom reports that she was readmitted to the hospital for treatment of her c/s incision (discharged this morning), and thinks that her EBM may be less. Mom reports that she intends to focus on pumping now that she will be at home. Discussed benefits of having baby at breast as well. Assisted mom to latch baby to left breast in cross-cradle position, but mom wanting to cradle baby. Explained to mom that supporting baby's head--at the neck--and her breast while baby latching assists the baby to achieve and maintain a deep latch. Mom not comfortable with this position, and baby latched to tip of nipple and only suckled 2-3 times and then slept. Enc mom to try latching again, but mom pleased baby on breast. Enc mom to attempt a deeper latch when ready. FOB at bedside and parents discussing baby, so enc parents to call for assistance as needed.   Maternal Data    Feeding Feeding Type: Breast Fed Nipple Type: Slow - flow Length of feed: 0 min  LATCH Score Latch: Too sleepy or reluctant, no latch achieved, no sucking elicited.  Audible Swallowing: None  Type of Nipple: Everted at rest and after stimulation  Comfort (Breast/Nipple): Soft / non-tender  Hold (Positioning): Assistance needed to correctly position infant at breast and maintain latch.  LATCH Score: 5  Interventions Interventions: Breast feeding basics reviewed;Assisted with latch;Hand express;Breast compression;Support pillows;Position options  Lactation Tools Discussed/Used     Consult Status Consult Status: PRN    Sherlyn Hay 05/02/2017, 12:09 PM

## 2017-05-02 NOTE — Discharge Summary (Signed)
Physician Discharge Summary  Patient ID: [name] MRN: [id number] DOB/AGE: 09-17-1985 31 y.o.  Admit date: 04/30/2017 Discharge date: 05/02/2017  Admission Diagnoses:postoperative incisional hematoma  Discharge Diagnoses:  Active Problems:   Abdominal wall hematoma   Discharged Condition: good  Hospital Course: OSA Bonnie White is a 31 y.o. [phone] who presents 1 week s/p cesarean with complaints of fever, headache, incisional pain, & body aches. Patient had emergent primary c/section for bradycardia on 9/19. Was discharged from the hospital on 9/21; baby remains in NICU. Symptoms began last night. Reports return of incisional/abdominal pain since last night that initially felt like burning and now describes as sore. Rates pain 7/10. Has taken percocet & ibuprofen without relief. Also noted some blood seeping from the middle of her incision, soaking her steri strips.  This morning felt bad, with headache & body aches. Took temp, was 100.3. Rates headache & body aches 4/10. Transient hypertension in this pregnancy. Reports blurred vision that has been ongoing issue for the last few months.   Denies cough, SOB, sore throat, epigastric pain, or breast pain.    She was seen by Dr Macon Large, patient was examined, attempted bedside incision and drainage which was unsuccessful. Opened a 3 cm section in midline, only able to get small clots and patient was very uncomfortable. Procedure aborted. Given likely infection, patient will be admitted for IV antibiotics (Zosyn and Vancomycin) and then scheduled for exploration and evacuation of subcutaneous hematoma in the OR tomorrow morning.  9/28 she was taken to OR, the incision was partially open and dark hematoma was noted throughout the subcutaneous space. The incision was completely opened. Evacuation was performed and the wound was irrigated. Healthy tissue was noted. The subcutaneous layer was closed with interrupted 2-0 plain gut.   Hemostasis was  observed. The skin was closed with 4-0 Vicryl. Sterile dressing was applied  Consults: None  Significant Diagnostic Studies: labs:  CBC    Component Value Date/Time   WBC 6.7 05/01/2017 0501   RBC 3.16 (L) 05/01/2017 0501   HGB 9.7 (L) 05/01/2017 0501   HCT 29.5 (L) 05/01/2017 0501   PLT 400 05/01/2017 0501   MCV 93.4 05/01/2017 0501   MCH 30.7 05/01/2017 0501   MCHC 32.9 05/01/2017 0501   RDW 13.0 05/01/2017 0501   LYMPHSABS 2.1 05/01/2017 0501   MONOABS 0.4 05/01/2017 0501   EOSABS 0.1 05/01/2017 0501   BASOSABS 0.0 05/01/2017 0501     Treatments: surgery: as described  Discharge Exam: Blood pressure 138/80, pulse (!) 52, temperature 97.9 F (36.6 C), temperature source Oral, resp. rate 18, height  (1.626 m), weight 76.7 kg (169 lb), SpO2 99 %, unknown if currently breastfeeding. General appearance: alert, cooperative and no distress GI: soft, non-tender; bowel sounds normal; no masses,  no organomegaly, incision dry and intact  Disposition: 01-Home or Self Care   Allergies as of 05/02/2017   No Known Allergies     Medication List    TAKE these medications   ibuprofen 600 MG tablet Commonly known as:  ADVIL,MOTRIN Take 1 tablet (600 mg total) by mouth every 6 (six) hours.   oxyCODONE-acetaminophen 5-325 MG tablet Commonly known as:  PERCOCET/ROXICET Take 1 tablet by mouth every 6 (six) hours as needed (pain scale 4-7).   PRENATAL VITAMINS PO Take 1 tablet by mouth daily.   senna-docusate 8.6-50 MG tablet Commonly known as:  Senokot-S Take 2 tablets by mouth at bedtime.      Follow-up Information  [facility] [place] Follow up in 4 week(s).   Specialty:  Obstetrics and Gynecology Contact information: [address] [place] [address] [phone]          Signed: Scheryl Darter 05/02/2017, 7:41 AM

## 2017-05-02 NOTE — Discharge Instructions (Signed)

## 2017-05-02 NOTE — Progress Notes (Signed)
Pt verbalizes understanding of d/c instructions, medications, follow up appointments, when to seek medical attention, and belongings policy. Pt has no questions at this time. She is planning to pump and then go to NICU to visit her baby while she waits on her ride to come. Sheryn Bison

## 2017-05-06 ENCOUNTER — Encounter: Payer: Self-pay | Admitting: Obstetrics & Gynecology

## 2017-05-06 ENCOUNTER — Ambulatory Visit: Admitting: Obstetrics & Gynecology

## 2017-05-06 VITALS — BP 119/77 | HR 84 | Resp 16 | Ht 64.0 in | Wt 163.0 lb

## 2017-05-06 DIAGNOSIS — Z9889 Other specified postprocedural states: Secondary | ICD-10-CM

## 2017-05-06 NOTE — Progress Notes (Signed)
   Subjective:    Patient ID: Bonnie White, female    DOB: 09/11/1985, 31 y.o.   MRN: [id number]  HPI C/S and BTL on 04-22-17 for non-reassuring fetal status. Then had drainage of a subcutaneous hematoma on 05-01-17. She scored a 1 on the depression screen today. She is doing well and has no concerns. Her baby is home from the NICU now.   Review of Systems     Objective:   Physical Exam Well nourished, well hydrated white female, no apparent distress Breathing, conversing, and ambulating normally Incision healing well    Assessment & Plan:  Post op doing well RTC for pp visit in 2-3 weeks

## 2017-06-03 ENCOUNTER — Ambulatory Visit (INDEPENDENT_AMBULATORY_CARE_PROVIDER_SITE_OTHER): Admitting: Obstetrics & Gynecology

## 2017-06-03 ENCOUNTER — Encounter: Payer: Self-pay | Admitting: Obstetrics & Gynecology

## 2017-06-03 NOTE — Progress Notes (Signed)
Post Partum Exam  Bonnie White is a 31 y.o. [id number] female who presents for a postpartum visit. She is 6 weeks postpartum following a low cervical transverse Cesarean section. I have fully reviewed the prenatal and intrapartum course. The delivery was at 39 gestational weeks.  Anesthesia: epidural. Postpartum course has been unremarkable. Baby's course has been unremarkable after baby was brought home from NICU. Baby is feeding by breast. Bleeding thin lochia. Bowel function is normal. Bladder function is normal. Patient is not sexually active. Contraception method is tubal ligation. Postpartum depression screening:neg (score 2)   The following portions of the patient's history were reviewed and updated as appropriate: allergies, current medications, past family history, past medical history, past social history, past surgical history and problem list.  Review of Systems Pertinent items are noted in HPI.    Objective:  Height 5\' 4"  (1.626 m), weight 171 lb (77.6 kg), unknown if currently breastfeeding.  General:  alert   Breasts:  inspection negative, no nipple discharge or bleeding, no masses or nodularity palpable  Lungs: clear to auscultation bilaterally  Heart:  regular rate and rhythm, S1, S2 normal, no murmur, click, rub or gallop  Abdomen: soft, non-tender; bowel sounds normal; no masses,  no organomegaly, Incision healed well   Vulva:  not evaluated  Vagina: not evaluated  Cervix:  not evaluated  Corpus: not evaluated  Adnexa:  not evaluated  Rectal Exam: Not performed.        Assessment:    Normal postpartum exam. Pap smear not done at today's visit.   Plan:   1. Contraception: tubal ligation 2. Pap smear due next year

## 2017-07-07 ENCOUNTER — Ambulatory Visit (INDEPENDENT_AMBULATORY_CARE_PROVIDER_SITE_OTHER): Admitting: Osteopathic Medicine

## 2017-07-07 ENCOUNTER — Encounter: Payer: Self-pay | Admitting: Osteopathic Medicine

## 2017-07-07 VITALS — BP 139/89 | HR 76 | Wt 176.7 lb

## 2017-07-07 DIAGNOSIS — O99345 Other mental disorders complicating the puerperium: Principal | ICD-10-CM

## 2017-07-07 DIAGNOSIS — F53 Postpartum depression: Secondary | ICD-10-CM | POA: Insufficient documentation

## 2017-07-07 LAB — COMPLETE METABOLIC PANEL WITH GFR
AG RATIO: 1.4 (calc) (ref 1.0–2.5)
ALKALINE PHOSPHATASE (APISO): 69 U/L (ref 33–115)
ALT: 42 U/L — AB (ref 6–29)
AST: 26 U/L (ref 10–30)
Albumin: 4.7 g/dL (ref 3.6–5.1)
BUN: 20 mg/dL (ref 7–25)
CHLORIDE: 102 mmol/L (ref 98–110)
CO2: 30 mmol/L (ref 20–32)
Calcium: 10 mg/dL (ref 8.6–10.2)
Creat: 0.74 mg/dL (ref 0.50–1.10)
GFR, Est African American: 125 mL/min/{1.73_m2} (ref >=60)
GFR, Est Non African American: 108 mL/min/{1.73_m2} (ref >=60)
GLOBULIN: 3.3 g/dL (ref 1.9–3.7)
Glucose, Bld: 89 mg/dL (ref 65–99)
POTASSIUM: 5 mmol/L (ref 3.5–5.3)
SODIUM: 139 mmol/L (ref 135–146)
Total Bilirubin: 0.4 mg/dL (ref 0.2–1.2)
Total Protein: 8 g/dL (ref 6.1–8.1)

## 2017-07-07 LAB — CBC
HEMATOCRIT: 40.7 % (ref 35.0–45.0)
Hemoglobin: 13.9 g/dL (ref 11.7–15.5)
MCH: 29.9 pg (ref 27.0–33.0)
MCHC: 34.2 g/dL (ref 32.0–36.0)
MCV: 87.5 fL (ref 80.0–100.0)
MPV: 10.7 fL (ref 7.5–12.5)
PLATELETS: 257 10*3/uL (ref 140–400)
RBC: 4.65 10*6/uL (ref 3.80–5.10)
RDW: 12.3 % (ref 11.0–15.0)
WBC: 4.9 10*3/uL (ref 3.8–10.8)

## 2017-07-07 LAB — TSH: TSH: 1.2 m[IU]/L

## 2017-07-07 MED ORDER — SERTRALINE HCL 25 MG PO TABS: 25.0000 mg | ORAL_TABLET | Freq: Every day | ORAL | 1 refills | Status: DC

## 2017-07-07 NOTE — Patient Instructions (Signed)
We are starting a medication today called Zoloft to help treat your postpartum depressino. This is a daily medication to help control your symptoms.   I also highly encourage my patients who are suffering from postpartum depression to seek care with a counselor or therapist. A therapist can coach you in techniques to recognize and deal with troubling thought patterns and behaviors. The ability to cope with external stressors is crucial to overall mental health.   Expect a call or message form this office in the next 2 weeks to check in: If you're doing well on the medicine but not feeling any effect, we can increase the dose. If you're starting to feel some effect/improvement, we can hold off on a dose increase and reevaluate at your office visit.   Let's plan to follow up in the office in 4 weeks. At that time, we can talk about how well the medicine is working for you, and we can consider increasing the dose, adding another medicine, etc.   If we are having trouble finding a good medication regimen for you, we can consult with a psychiatrist to assist with medication management.   If you experience problematic side effects, please let me know ASAP - we can switch the medicine any time, and we don't need an appointment for this.   Any questions or concerns, call me!

## 2017-07-07 NOTE — Progress Notes (Signed)
HPI: Bonnie White is a 31 y.o. female who  has a past medical history of Medical history non-contributory.  she presents to [facility] today, 07/07/17,  for chief complaint of:  Chief Complaint  Patient presents with  . Depression    Pt stated went through dramatic eperience with the bay in ICU, since the baby came home having depression symptoms.    Difficulty with motivation, energy, sleeping, fatigue since baby was born. Stressful birth process - emergency C-section and baby was not breathing and no heartbeat, recovered ok but was in NICU for a few weeks. Has had a hard time getting back into the swing of things, feeling a lot of guilt also about not being able to care for her other kids as well. Her husband is concerned.   No thoughts of hurting self or baby/other children   Previously on Wellbutrin for about a year before coming off this medicine. Previously seeing a Veterinary surgeon. No other Rx treatment in the past.    Depression screen [facility] 2/9 07/07/2017 10/30/2014  Decreased Interest 3 0  Down, Depressed, Hopeless 3 0  PHQ - 2 Score 6 0  Altered sleeping 3 -  Tired, decreased energy 3 -  Change in appetite 3 -  Feeling bad or failure about yourself  3 -  Trouble concentrating 2 -  Moving slowly or fidgety/restless 3 -  Suicidal thoughts 0 -  PHQ-9 Score 23 -      Past medical, surgical, social and family history reviewed:  Patient Active Problem List   Diagnosis Date Noted  . Abdominal wall hematoma 04/30/2017  . Status post tubal ligation 04/24/2017  . Status post primary low transverse cesarean section 04/24/2017    Past Surgical History:  Procedure Laterality Date  . CESAREAN SECTION N/A 04/22/2017   Procedure: CESAREAN SECTION;  Surgeon: Levie Heritage, DO;  Location: [facility] BIRTHING SUITES;  Service: Obstetrics;  Laterality: N/A;  . HEMATOMA EVACUATION N/A 05/01/2017   Procedure: EVACUATION HEMATOMA;  Surgeon: Adam Phenix,  MD;  Location: [facility] ORS;  Service: Gynecology;  Laterality: N/A;  . WISDOM TOOTH EXTRACTION      Social History   Tobacco Use  . Smoking status: Former Smoker    Packs/day: 8.00    Years: 1.00    Pack years: 8.00    Types: Cigarettes  . Smokeless tobacco: Never Used  Substance Use Topics  . Alcohol use: No    Alcohol/week: 0.0 oz    Family History  Problem Relation Age of Onset  . Ulcerative colitis Father   . Seizures Sister      Current medication list and allergy/intolerance information reviewed:    Current Outpatient Medications  Medication Sig Dispense Refill  . ibuprofen (ADVIL,MOTRIN) 600 MG tablet Take 1 tablet (600 mg total) by mouth every 6 (six) hours. (Patient not taking: Reported on 06/03/2017) 30 tablet 0  . oxyCODONE-acetaminophen (PERCOCET/ROXICET) 5-325 MG tablet Take 1 tablet by mouth every 6 (six) hours as needed (pain scale 4-7). (Patient not taking: Reported on 06/03/2017) 15 tablet 0  . Prenatal Multivit-Min-Fe-FA (PRENATAL VITAMINS PO) Take 1 tablet by mouth daily.     Marland Kitchen senna-docusate (SENOKOT-S) 8.6-50 MG tablet Take 2 tablets by mouth at bedtime. (Patient not taking: Reported on 06/03/2017) 30 tablet 1   No current facility-administered medications for this visit.     No Known Allergies    Review of Systems:  Constitutional:  No  fever, no chills, No recent  illness  HEENT: No  headache, no vision change, no hearing change, No sore throat, No  sinus pressure  Cardiac: No  chest pain, No  pressure, No palpitations, No  Orthopnea  Respiratory:  No  shortness of breath. No  Cough  Gastrointestinal: No  abdominal pain, No  nausea, No  vomiting,  No  blood in stool, No  diarrhea, No  constipation   Musculoskeletal: No new myalgia/arthralgia  Genitourinary: No  incontinence, No  abnormal genital bleeding, No abnormal genital discharge  Skin: No  Rash, No other wounds/concerning lesions  Hem/Onc: No  easy bruising/bleeding, No  abnormal lymph  node  Endocrine: No cold intolerance,  No heat intolerance. No polyuria/polydipsia/polyphagia   Neurologic: No  weakness, No  dizziness, No  slurred speech/focal weakness/facial droop  Psychiatric: +concerns with depression, No  concerns with anxiety, +sleep problems, +mood problems  Exam:  BP 139/89   Pulse 76   Wt 176 lb 11.2 oz (80.2 kg)   Breastfeeding? Yes Comment: breastfeeding/bottle  BMI 30.33 kg/m   Constitutional: VS see above. General Appearance: alert, well-developed, well-nourished, NAD  Eyes: Normal lids and conjunctive, non-icteric sclera  Ears, Nose, Mouth, Throat: MMM, Normal external inspection ears/nares/mouth/lips/gums.   Neck: No masses, trachea midline. No thyroid enlargement. No tenderness/mass appreciated. No lymphadenopathy  Respiratory: Normal respiratory effort. no wheeze, no rhonchi, no rales  Cardiovascular: S1/S2 normal, no murmur, no rub/gallop auscultated. RRR. No lower extremity edema.   Musculoskeletal: Gait normal.   Neurological: Normal balance/coordination. No tremor.   Skin: warm, dry  Psychiatric: Normal judgment/insight. Depressed mood and affect. Oriented x3.      ASSESSMENT/PLAN:   Postpartum depression - Plan: sertraline (ZOLOFT) 25 MG tablet, CBC, COMPLETE METABOLIC PANEL WITH GFR, TSH    Patient Instructions  We are starting a medication today called Zoloft to help treat your postpartum depressino. This is a daily medication to help control your symptoms.   I also highly encourage my patients who are suffering from postpartum depression to seek care with a counselor or therapist. A therapist can coach you in techniques to recognize and deal with troubling thought patterns and behaviors. The ability to cope with external stressors is crucial to overall mental health.   Expect a call or message form this office in the next 2 weeks to check in: If you're doing well on the medicine but not feeling any effect, we can increase  the dose. If you're starting to feel some effect/improvement, we can hold off on a dose increase and reevaluate at your office visit.   Let's plan to follow up in the office in 4 weeks. At that time, we can talk about how well the medicine is working for you, and we can consider increasing the dose, adding another medicine, etc.   If we are having trouble finding a good medication regimen for you, we can consult with a psychiatrist to assist with medication management.   If you experience problematic side effects, please let me know ASAP - we can switch the medicine any time, and we don't need an appointment for this.   Any questions or concerns, call me!      Visit summary with medication list and pertinent instructions was printed for patient to review. All questions at time of visit were answered - patient instructed to contact office with any additional concerns. ER/RTC precautions were reviewed with the patient. Follow-up plan: Return in about 4 weeks (around 08/04/2017) for recheck postpartum depression on Zoloft - see me  sooner if needed.    Please note: voice recognition software was used to produce this document, and typos may escape review. Please contact Dr. Lyn HollingsheadAlexander for any needed clarifications.

## 2017-07-21 ENCOUNTER — Telehealth: Payer: Self-pay | Admitting: Osteopathic Medicine

## 2017-07-21 NOTE — Telephone Encounter (Signed)
Please call patient: Just calling to check up on her after starting antidepressant medication 2 weeks ago, any questions/concerns please let us know.  If doing well on the medication and would like to increase to 50 mg /2 tablets daily, she can go ahead and do this.  If not, can discuss further at her upcoming appointment in January.

## 2017-07-21 NOTE — Telephone Encounter (Signed)
-----   Message from Sunnie NielsenNatalie Zeppelin Beckstrand, DO sent at 07/07/2017 10:22 AM EST ----- Regarding: postpartum depression Started Zoloft 07/07/17

## 2017-07-22 NOTE — Telephone Encounter (Signed)
Called pt and she states doing fine on the antidepressant, no side effects. Will see you in January and will increase to 2 tabs daily when needed. KG LPN

## 2017-07-23 NOTE — Telephone Encounter (Signed)
Noted  

## 2017-08-07 ENCOUNTER — Encounter: Payer: Self-pay | Admitting: Osteopathic Medicine

## 2017-08-07 ENCOUNTER — Ambulatory Visit (INDEPENDENT_AMBULATORY_CARE_PROVIDER_SITE_OTHER): Admitting: Osteopathic Medicine

## 2017-08-07 VITALS — BP 134/86 | HR 77 | Wt 184.1 lb

## 2017-08-07 DIAGNOSIS — F5104 Psychophysiologic insomnia: Secondary | ICD-10-CM | POA: Diagnosis not present

## 2017-08-07 DIAGNOSIS — R748 Abnormal levels of other serum enzymes: Secondary | ICD-10-CM

## 2017-08-07 DIAGNOSIS — O99345 Other mental disorders complicating the puerperium: Principal | ICD-10-CM

## 2017-08-07 DIAGNOSIS — F53 Postpartum depression: Secondary | ICD-10-CM

## 2017-08-07 LAB — COMPLETE METABOLIC PANEL WITH GFR
AG Ratio: 1.6 (calc) (ref 1.0–2.5)
ALT: 23 U/L (ref 6–29)
AST: 18 U/L (ref 10–30)
Albumin: 4.7 g/dL (ref 3.6–5.1)
Alkaline phosphatase (APISO): 63 U/L (ref 33–115)
BUN: 12 mg/dL (ref 7–25)
CALCIUM: 9.4 mg/dL (ref 8.6–10.2)
CO2: 30 mmol/L (ref 20–32)
CREATININE: 0.66 mg/dL (ref 0.50–1.10)
Chloride: 102 mmol/L (ref 98–110)
GFR, EST NON AFRICAN AMERICAN: 118 mL/min/{1.73_m2} (ref >=60)
GFR, Est African American: 136 mL/min/{1.73_m2} (ref >=60)
Globulin: 2.9 g/dL (calc) (ref 1.9–3.7)
Glucose, Bld: 83 mg/dL (ref 65–99)
Potassium: 4.6 mmol/L (ref 3.5–5.3)
Sodium: 138 mmol/L (ref 135–146)
Total Bilirubin: 0.4 mg/dL (ref 0.2–1.2)
Total Protein: 7.6 g/dL (ref 6.1–8.1)

## 2017-08-07 MED ORDER — TRAZODONE HCL 50 MG PO TABS: 25.0000 mg | ORAL_TABLET | Freq: Every day | ORAL | 1 refills | Status: DC

## 2017-08-07 MED ORDER — SERTRALINE HCL 50 MG PO TABS: 50.0000 mg | ORAL_TABLET | Freq: Every day | ORAL | 1 refills | Status: DC

## 2017-08-07 NOTE — Progress Notes (Signed)
HPI: Bonnie White is a 32 y.o. female who  has a past medical history of Depression and Medical history non-contributory.  [facility], 08/07/17,  for chief complaint of:  Chief Complaint  Patient presents with  . Follow-up    Today: We started Zoloft at last visit.  She is up to 50 mg of this at this point and is feeling a lot better.  She has not been able to get into counseling just yet, she is working with a Veterinary surgeoncounselor through her church and they will be able to get her in sometime next month.  She does not feel an urgent need for a counselor in the meantime.  Sleep is a big issue for her, she is finding it difficult to maintain sleep patterns.  Some of this course may be due to new baby but she feels she would like to try something for the insomnia if possible.  Last visit 12/0/18 we discussed difficulty with motivation, energy, sleeping, fatigue since baby was born. Stressful birth process - emergency C-section and baby was not breathing and no heartbeat, recovered ok but was in NICU for a few weeks. Has had a hard time getting back into the swing of things, feeling a lot of guilt also about not being able to care for her other kids as well. Her husband was concerned. No thoughts of hurting self or baby/other children. Previously on Wellbutrin for about a year before coming off this medicine. Previously seeing a Veterinary surgeoncounselor. No other Rx treatment in the past.      [facility] 08/07/2017 07/07/2017 10/30/2014  Decreased Interest 2 3 0  Down, Depressed, Hopeless 1 3 0  PHQ - 2 Score 3 6 0  Altered sleeping 3 3 -  Tired, decreased energy 2 3 -  Change in appetite 1 3 -  Feeling bad or failure about yourself  2 3 -  Trouble concentrating 1 2 -  Moving slowly or fidgety/restless 1 3 -  Suicidal thoughts 0 0 -  PHQ-9 Score 13 23 -      Past medical, surgical, social and family history reviewed:  Patient Active  Problem List   Diagnosis Date Noted  . Postpartum depression 07/07/2017  . Abdominal wall hematoma 04/30/2017  . Status post tubal ligation 04/24/2017  . Status post primary low transverse cesarean section 04/24/2017    Past Surgical History:  Procedure Laterality Date  . CESAREAN SECTION N/A 04/22/2017   Procedure: CESAREAN SECTION;  Surgeon: Levie HeritageStinson, Jacob J, DO;  [facility];  Service: Obstetrics;  Laterality: N/A;  . HEMATOMA EVACUATION N/A 05/01/2017   Procedure: EVACUATION HEMATOMA;  Surgeon: Adam PhenixArnold, James G, MD;  Location: [facility] ORS;  Service: Gynecology;  Laterality: N/A;  . WISDOM TOOTH EXTRACTION      Social History   Tobacco Use  . Smoking status: Former Smoker    Packs/day: 8.00    Years: 1.00    Pack years: 8.00    Types: Cigarettes  . Smokeless tobacco: Never Used  Substance Use Topics  . Alcohol use: No    Alcohol/week: 0.0 oz    Family History  Problem Relation Age of Onset  . Ulcerative colitis Father   . Seizures Sister      Current medication list and allergy/intolerance information reviewed:    Current Outpatient Medications  Medication Sig Dispense Refill  . Cyanocobalamin (B-12) 100 MCG TABS Take by mouth.    .Marland Kitchen  Prenatal Multivit-Min-Fe-FA (PRENATAL VITAMINS PO) Take 1 tablet by mouth daily.     . sertraline (ZOLOFT) 25 MG tablet Take 1 tablet (25 mg total) by mouth daily. 30 tablet 1  . vitamin C (ASCORBIC ACID) 250 MG tablet Take 250 mg by mouth daily.     No current facility-administered medications for this visit.     No Known Allergies    Review of Systems:  Constitutional:  Feels well today   HEENT: No  headache, no vision change  Cardiac: No  chest pain,  Respiratory:  No  shortness of breath.   Gastrointestinal: No  abdominal pain, No  nausea  Neurologic: No  weakness, No  dizziness,   Psychiatric: +concerns with depression, No  concerns with anxiety, +sleep problems, +mood problems  Exam:  BP 134/86    Pulse 77   Wt 184 lb 1.6 oz (83.5 kg)   BMI 31.60 kg/m   Constitutional: VS see above. General Appearance: alert, well-developed, well-nourished, NAD  Eyes: Normal lids and conjunctive, non-icteric sclera  Ears, Nose, Mouth, Throat: MMM, Normal external inspection ears/nares/mouth/lips/gums.   Respiratory: Normal respiratory effort.   Musculoskeletal: Gait normal.   Neurological: Normal balance/coordination. No tremor.   Skin: warm, dry  Psychiatric: Normal judgment/insight. Depressed mood and affect. Oriented x3.      ASSESSMENT/PLAN:   Discussed taking sertraline whole dose once per day instead of splitting it twice daily as she has been doing with 25 mg tablets, playing with the timing of the dosing may also help with sleep patterns, try taking it nightly.    Can take trazodone as needed, advised to monitor the baby closely for any signs of sedation/lethargy as the patient is breast-feeding.  We discussed the risks versus benefits of low-dose trazodone, certainly would not want to do any other sedative medications  Patient feels overall she is doing really well, would like to continue the Zoloft at current dose and plan to recheck in a few months.  Postpartum depression - Plan: sertraline (ZOLOFT) 50 MG tablet  Psychophysiological insomnia - Plan: traZODone (DESYREL) 50 MG tablet  Elevated liver enzymes - Plan: COMPLETE METABOLIC PANEL WITH GFR    Meds ordered this encounter  Medications  . traZODone (DESYREL) 50 MG tablet    Sig: Take 0.5-1 tablets (25-50 mg total) by mouth at bedtime.    Dispense:  30 tablet    Refill:  1  . sertraline (ZOLOFT) 50 MG tablet    Sig: Take 1 tablet (50 mg total) by mouth daily.    Dispense:  90 tablet    Refill:  1         Visit summary with medication list and pertinent instructions was printed for patient to review. All questions at time of visit were answered - patient instructed to contact office with any additional  concerns. ER/RTC precautions were reviewed with the patient. Follow-up plan: Return in about 3 months (around 11/05/2017) for recheck on zoloft and trazodone for PPD and sleep .    Please note: voice recognition software was used to produce this document, and typos may escape review. Please contact Dr. Lyn Hollingshead for any needed clarifications.

## 2017-08-08 ENCOUNTER — Encounter: Payer: Self-pay | Admitting: Osteopathic Medicine

## 2017-08-08 DIAGNOSIS — F5104 Psychophysiologic insomnia: Secondary | ICD-10-CM

## 2017-08-08 HISTORY — DX: Psychophysiologic insomnia: F51.04

## 2017-08-26 ENCOUNTER — Other Ambulatory Visit: Payer: Self-pay | Admitting: Osteopathic Medicine

## 2017-08-26 DIAGNOSIS — F53 Postpartum depression: Secondary | ICD-10-CM

## 2017-08-26 DIAGNOSIS — O99345 Other mental disorders complicating the puerperium: Principal | ICD-10-CM

## 2017-10-06 ENCOUNTER — Other Ambulatory Visit: Payer: Self-pay | Admitting: Osteopathic Medicine

## 2017-10-06 DIAGNOSIS — F5104 Psychophysiologic insomnia: Secondary | ICD-10-CM

## 2017-10-06 DIAGNOSIS — O99345 Other mental disorders complicating the puerperium: Secondary | ICD-10-CM

## 2017-10-06 DIAGNOSIS — F53 Postpartum depression: Secondary | ICD-10-CM

## 2017-11-05 ENCOUNTER — Ambulatory Visit: Admitting: Osteopathic Medicine

## 2017-11-30 ENCOUNTER — Encounter: Payer: Self-pay | Admitting: Emergency Medicine

## 2017-11-30 ENCOUNTER — Emergency Department (INDEPENDENT_AMBULATORY_CARE_PROVIDER_SITE_OTHER)
Admission: EM | Admit: 2017-11-30 | Discharge: 2017-11-30 | Disposition: A | Discharge status: Home / Self Care | Source: Home / Self Care

## 2017-11-30 DIAGNOSIS — J029 Acute pharyngitis, unspecified: Secondary | ICD-10-CM

## 2017-11-30 LAB — POCT RAPID STREP A (OFFICE): RAPID STREP A SCREEN: NEGATIVE

## 2017-11-30 NOTE — Discharge Instructions (Signed)
Return if any problems.

## 2017-11-30 NOTE — ED Provider Notes (Signed)
[name]    CSN: [id number] Arrival date & time: 11/30/17  1534     History   Chief Complaint Chief Complaint  Patient presents with  . Sore Throat    HPI Bonnie White is a 32 y.o. female.   The history is provided by the patient. No language interpreter was used.  Sore Throat  This is a new problem. The current episode started yesterday. The problem occurs constantly. The problem has been gradually worsening. Pertinent negatives include no chest pain and no abdominal pain. Nothing aggravates the symptoms. Nothing relieves the symptoms. She has tried nothing for the symptoms. The treatment provided no relief.  Pt complains of a sore throat and sores in her mouth  Past Medical History:  Diagnosis Date  . Depression   . Medical history non-contributory     Patient Active Problem List   Diagnosis Date Noted  . Psychophysiological insomnia 08/08/2017  . Postpartum depression 07/07/2017  . Abdominal wall hematoma 04/30/2017  . Status post tubal ligation 04/24/2017  . Status post primary low transverse cesarean section 04/24/2017    Past Surgical History:  Procedure Laterality Date  . CESAREAN SECTION N/A 04/22/2017   Procedure: CESAREAN SECTION;  Surgeon: Levie Heritage, DO;  Location: [facility] BIRTHING SUITES;  Service: Obstetrics;  Laterality: N/A;  . HEMATOMA EVACUATION N/A 05/01/2017   Procedure: EVACUATION HEMATOMA;  Surgeon: Adam Phenix, MD;  Location: [facility] ORS;  Service: Gynecology;  Laterality: N/A;  . WISDOM TOOTH EXTRACTION      OB History    Gravida  3   Para  3   Term  3   Preterm      AB      Living  3     SAB      TAB      Ectopic      Multiple  0   Live Births  3            Home Medications    Prior to Admission medications   Medication Sig Start Date End Date Taking? Authorizing Provider  Cyanocobalamin (B-12) 100 MCG TABS Take by mouth.    [provider]  Prenatal Multivit-Min-Fe-FA (PRENATAL  VITAMINS PO) Take 1 tablet by mouth daily.     [provider]  sertraline (ZOLOFT) 25 MG tablet TAKE 1 TABLET(25 MG) BY MOUTH DAILY 10/07/17   Sunnie Nielsen, DO  sertraline (ZOLOFT) 50 MG tablet Take 1 tablet (50 mg total) by mouth daily. 08/07/17   Sunnie Nielsen, DO  traZODone (DESYREL) 50 MG tablet TAKE 1/2 TO 1 TABLET(25 TO 50 MG) BY MOUTH AT BEDTIME 10/07/17   Sunnie Nielsen, DO  vitamin C (ASCORBIC ACID) 250 MG tablet Take 250 mg by mouth daily.    [provider]    Family History Family History  Problem Relation Age of Onset  . Ulcerative colitis Father   . Seizures Sister     Social History Social History   Tobacco Use  . Smoking status: Former Smoker    Packs/day: 8.00    Years: 1.00    Pack years: 8.00    Types: Cigarettes  . Smokeless tobacco: Never Used  Substance Use Topics  . Alcohol use: No    Alcohol/week: 0.0 oz  . Drug use: No     Allergies   Patient has no known allergies.   Review of Systems Review of Systems  Cardiovascular: Negative for chest pain.  Gastrointestinal: Negative for abdominal pain.  All other systems reviewed and are negative.    Physical Exam Triage Vital Signs ED Triage Vitals [11/30/17 1550]  Enc Vitals Group     BP 133/86     Pulse Rate 68     Resp      Temp 97.6 F (36.4 C)     Temp Source Oral     SpO2 96 %     Weight 175 lb (79.4 kg)     Height      Head Circumference      Peak Flow      Pain Score 0     Pain Loc      Pain Edu?      Excl. in GC?    No data found.  Updated Vital Signs BP 133/86 (BP Location: Right Arm)   Pulse 68   Temp 97.6 F (36.4 C) (Oral)   Wt 175 lb (79.4 kg)   SpO2 96%   BMI 30.04 kg/m   Visual Acuity Right Eye Distance:   Left Eye Distance:   Bilateral Distance:    Right Eye Near:   Left Eye Near:    Bilateral Near:     Physical Exam  Constitutional: She is oriented to person, place, and time. She appears well-developed and well-nourished.    HENT:  Head: Normocephalic.  Right Ear: Tympanic membrane normal.  Left Ear: Tympanic membrane normal.  Mouth/Throat: Oropharynx is clear and moist and mucous membranes are normal.  Oral ulcers,   Eyes: Pupils are equal, round, and reactive to light. EOM are normal.  Neck: Normal range of motion.  Pulmonary/Chest: Effort normal.  Abdominal: Soft. She exhibits no distension.  Musculoskeletal: Normal range of motion.  Neurological: She is alert and oriented to person, place, and time.  Skin: Skin is warm.  Psychiatric: She has a normal mood and affect.  Nursing note and vitals reviewed.    UC Treatments / Results  Labs (all labs ordered are listed, but only abnormal results are displayed) Labs Reviewed  STREP A DNA PROBE  POCT RAPID STREP A (OFFICE)   Strep is negative EKG None  Radiology No results found.  Procedures Procedures (including critical care time)  Medications Ordered in UC Medications - No data to display  Initial Impression / Assessment and Plan / UC Course  I have reviewed the triage vital signs and the nursing notes.  Pertinent labs & imaging results that were available during my care of the patient were reviewed by me and considered in my medical decision making (see chart for details).      Final Clinical Impressions(s) / UC Diagnoses   Final diagnoses:  Viral pharyngitis     Discharge Instructions     Return if any problems.   ED Prescriptions    None     Controlled Substance Prescriptions [place] Controlled Substance Registry consulted? Not Applicable  An After Visit Summary was printed and given to the patient. Pt counseled on viral illness and symptomatic treatment.    Elson Areas, [place] 11/30/17 1730

## 2017-11-30 NOTE — ED Triage Notes (Signed)
Pt c/o sore throat and body aches that started yesterday.

## 2017-12-01 ENCOUNTER — Telehealth: Payer: Self-pay

## 2017-12-01 LAB — STREP A DNA PROBE: GROUP A STREP PROBE: NOT DETECTED

## 2017-12-01 NOTE — Telephone Encounter (Signed)
Left message on VM with contact information and to call with any questions or concerns.

## 2017-12-05 ENCOUNTER — Other Ambulatory Visit: Payer: Self-pay | Admitting: Osteopathic Medicine

## 2017-12-05 DIAGNOSIS — F53 Postpartum depression: Secondary | ICD-10-CM

## 2017-12-05 DIAGNOSIS — O99345 Other mental disorders complicating the puerperium: Principal | ICD-10-CM

## 2018-01-04 ENCOUNTER — Other Ambulatory Visit: Payer: Self-pay | Admitting: Osteopathic Medicine

## 2018-01-04 DIAGNOSIS — F5104 Psychophysiologic insomnia: Secondary | ICD-10-CM

## 2018-01-06 ENCOUNTER — Encounter: Payer: Self-pay | Admitting: Osteopathic Medicine

## 2018-01-06 ENCOUNTER — Ambulatory Visit (INDEPENDENT_AMBULATORY_CARE_PROVIDER_SITE_OTHER): Admitting: Osteopathic Medicine

## 2018-01-06 VITALS — BP 117/72 | HR 57 | Temp 97.7°F | Wt 167.4 lb

## 2018-01-06 DIAGNOSIS — Z0000 Encounter for general adult medical examination without abnormal findings: Secondary | ICD-10-CM

## 2018-01-06 DIAGNOSIS — O99345 Other mental disorders complicating the puerperium: Secondary | ICD-10-CM | POA: Diagnosis not present

## 2018-01-06 DIAGNOSIS — F53 Postpartum depression: Secondary | ICD-10-CM

## 2018-01-06 DIAGNOSIS — F5104 Psychophysiologic insomnia: Secondary | ICD-10-CM

## 2018-01-06 MED ORDER — TRAZODONE HCL 50 MG PO TABS: 25.0000 mg | ORAL_TABLET | Freq: Every evening | ORAL | 0 refills | Status: DC | PRN

## 2018-01-06 MED ORDER — SERTRALINE HCL 50 MG PO TABS: 50.0000 mg | ORAL_TABLET | Freq: Every day | ORAL | 3 refills | Status: DC

## 2018-01-06 NOTE — Progress Notes (Signed)
HPI: Bonnie White is a 32 y.o. female who  has a past medical history of Depression and Medical history non-contributory.  she presents to [facility] today, 01/06/18,  for chief complaint of:  Recheck depression / medications  Doing well on Zoloft and Trazodone for depression and insomnia. Taking 1/2 tablet trazodone few nights per week. Has no desire at this point to change or wean off medications. She's doing well with weight loss and this is boosting her mood, too. 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    Past medical history, surgical history, and family history reviewed.  Current medication list and allergy/intolerance information reviewed.   (See remainder of HPI, ROS, Phys Exam below)  No results found.  No results found for this or any previous visit (from the past 72 hour(s)).   ASSESSMENT/PLAN:   Postpartum depression - improved! Continue Zoloft  - Plan: sertraline (ZOLOFT) 50 MG tablet  Psychophysiological insomnia - stable, continue Trazodone - Plan: traZODone (DESYREL) 50 MG tablet  Annual physical exam - labs ordered for future visit - annual not performed or billed today  - Plan: CBC, COMPLETE METABOLIC PANEL WITH GFR, Lipid panel   Meds ordered this encounter  Medications  . traZODone (DESYREL) 50 MG tablet    Sig: Take 0.5 tablets (25 mg total) by mouth at bedtime as needed for sleep.    Dispense:  45 tablet    Refill:  0  . sertraline (ZOLOFT) 50 MG tablet    Sig: Take 1 tablet (50 mg total) by mouth daily.    Dispense:  90 tablet    Refill:  3     Follow-up plan: Return for annual, routine labs and Pap smear .     ############################################ ############################################ ############################################ ############################################    Outpatient Encounter Medications as of 01/06/2018  Medication Sig  . Cyanocobalamin (B-12) 100 MCG TABS  Take by mouth.  . Prenatal Multivit-Min-Fe-FA (PRENATAL VITAMINS PO) Take 1 tablet by mouth daily.   . sertraline (ZOLOFT) 25 MG tablet TAKE 1 TABLET(25 MG) BY MOUTH DAILY  . sertraline (ZOLOFT) 50 MG tablet Take 1 tablet (50 mg total) by mouth daily. Pt needs f/u appt w/PCP for refills.  . traZODone (DESYREL) 50 MG tablet TAKE 1/2 TO 1 TABLET(25 TO 50 MG) BY MOUTH AT BEDTIME  . vitamin C (ASCORBIC ACID) 250 MG tablet Take 250 mg by mouth daily.   No facility-administered encounter medications on file as of 01/06/2018.    No Known Allergies    Review of Systems:  Constitutional: No recent illness  Cardiac: No  chest pain  Respiratory:  No  shortness of breath.   Neurologic: No  weakness, No  Dizziness  Psychiatric: No  concerns with depression, No  concerns with anxiety  Exam:  BP 117/72 (BP Location: Left Arm, Patient Position: Sitting, Cuff Size: Normal)   Pulse (!) 57   Temp 97.7 F (36.5 C) (Oral)   Wt 167 lb 6.4 oz (75.9 kg)   BMI 28.73 kg/m   Constitutional: VS see above. General Appearance: alert, well-developed, well-nourished, NAD  Eyes: Normal lids and conjunctive, non-icteric sclera  Ears, Nose, Mouth, Throat: MMM, Normal external inspection ears/nares/mouth/lips/gums.  Neck: No masses, trachea midline.   Respiratory: Normal respiratory effort  Musculoskeletal: Gait normal. Symmetric and independent movement of all extremities  Neurological: Normal balance/coordination. No tremor.  Skin: warm, dry, intact.   Psychiatric: Normal judgment/insight. Normal mood and affect. Oriented x3. No thought disorder, no SI/HI  Visit summary with medication list and pertinent instructions was printed for patient to review, advised to alert us if any changes needed. All questions at time of visit were answered - patient instructed to contact office with any additional concerns. ER/RTC precautions were reviewed with the patient and understanding verbalized.   Follow-up  plan: Return for annual, routine labs and Pap smear .  Note: Total time spent 15 minutes, greater than 50% of the visit was spent face-to-face counseling and coordinating care for the following: The primary encounter diagnosis was Postpartum depression. Diagnoses of Psychophysiological insomnia and Annual physical exam were also pertinent to this visit.Marland Kitchen.  Please note: voice recognition software was used to produce this document, and typos may escape review. Please contact Dr. Lyn HollingsheadAlexander for any needed clarifications.

## 2018-02-15 LAB — LIPID PANEL
CHOL/HDL RATIO: 3.3 (calc) (ref <5.0)
CHOLESTEROL: 208 mg/dL — AB (ref <200)
HDL: 63 mg/dL (ref >50)
LDL Cholesterol (Calc): 131 mg/dL (calc) — ABNORMAL HIGH
Non-HDL Cholesterol (Calc): 145 mg/dL (calc) — ABNORMAL HIGH (ref <130)
Triglycerides: 45 mg/dL (ref <150)

## 2018-02-15 LAB — CBC
HCT: 40.1 % (ref 35.0–45.0)
HEMOGLOBIN: 13.4 g/dL (ref 11.7–15.5)
MCH: 31 pg (ref 27.0–33.0)
MCHC: 33.4 g/dL (ref 32.0–36.0)
MCV: 92.8 fL (ref 80.0–100.0)
MPV: 10.9 fL (ref 7.5–12.5)
PLATELETS: 218 10*3/uL (ref 140–400)
RBC: 4.32 10*6/uL (ref 3.80–5.10)
RDW: 11.8 % (ref 11.0–15.0)
WBC: 4.8 10*3/uL (ref 3.8–10.8)

## 2018-02-15 LAB — COMPLETE METABOLIC PANEL WITH GFR
AG Ratio: 1.7 (calc) (ref 1.0–2.5)
ALKALINE PHOSPHATASE (APISO): 62 U/L (ref 33–115)
ALT: 9 U/L (ref 6–29)
AST: 15 U/L (ref 10–30)
Albumin: 4.8 g/dL (ref 3.6–5.1)
BUN: 23 mg/dL (ref 7–25)
CO2: 25 mmol/L (ref 20–32)
CREATININE: 0.73 mg/dL (ref 0.50–1.10)
Calcium: 9.8 mg/dL (ref 8.6–10.2)
Chloride: 103 mmol/L (ref 98–110)
GFR, Est African American: 126 mL/min/{1.73_m2} (ref >=60)
GFR, Est Non African American: 109 mL/min/{1.73_m2} (ref >=60)
GLUCOSE: 84 mg/dL (ref 65–99)
Globulin: 2.9 g/dL (calc) (ref 1.9–3.7)
Potassium: 4.7 mmol/L (ref 3.5–5.3)
Sodium: 137 mmol/L (ref 135–146)
Total Bilirubin: 0.4 mg/dL (ref 0.2–1.2)
Total Protein: 7.7 g/dL (ref 6.1–8.1)

## 2018-02-15 IMAGING — CT CT ABD-PELV W/ CM
1 of 3 series · 13 of 32 positions shown, 18 images · IV contrast (OMNIPAQUE)
Comparison: None

CLINICAL DATA: Post Caesarean section and tubal ligation on
04/21/2017, with pain, redness and bleeding with infection at
incision site, fever

EXAM:
CT ABDOMEN AND PELVIS WITH CONTRAST
TECHNIQUE: Multidetector CT imaging of the abdomen and pelvis was performed
using the standard protocol following bolus administration of
intravenous contrast. Sagittal and coronal MPR images reconstructed
from axial data set.
CONTRAST:  100mL HDUADU-DQQ IOPAMIDOL (HDUADU-DQQ) INJECTION 61% IV.
Dilute oral contrast.

[Series 2: routine abdomen/pelvis with · axial · 0.72mm/px · z∈[+614,+1004]mm · 13 of 92 slices shown, 18 images]
[im 7/92  soft-tissue]
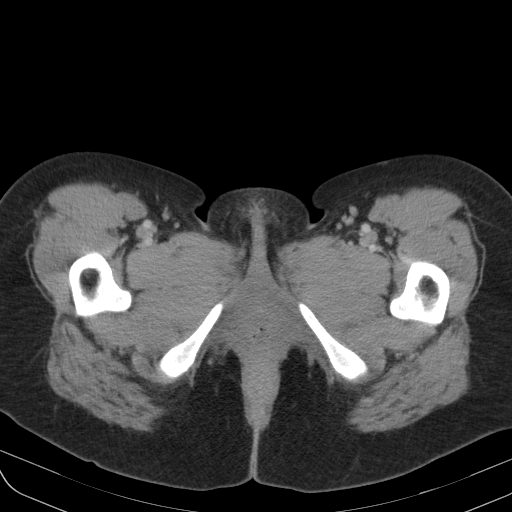
[im 7/92  bone]
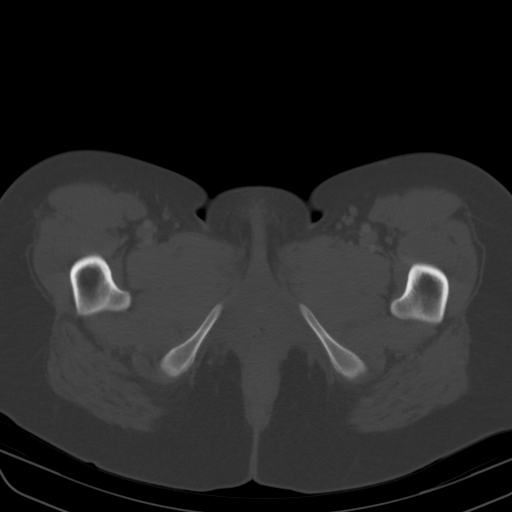
[im 13/92  soft-tissue]
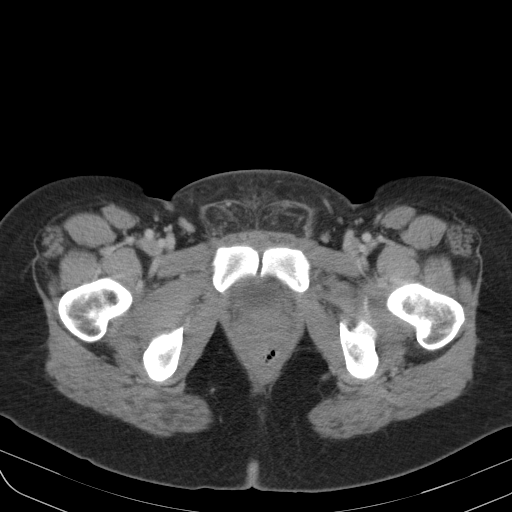
[im 19/92  soft-tissue]
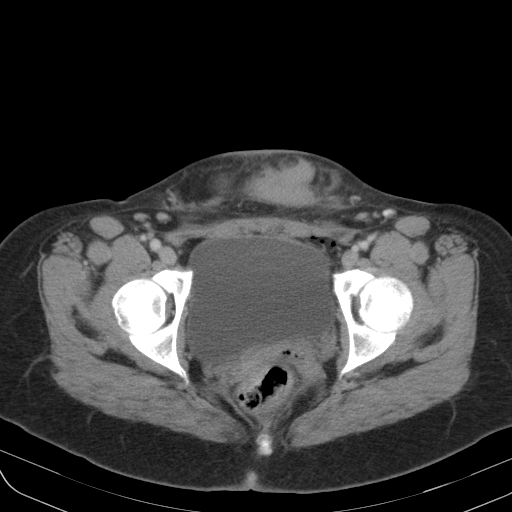
[im 31/92  soft-tissue]
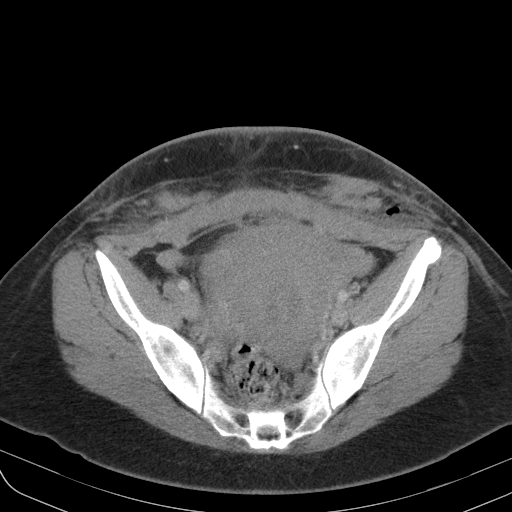
[im 37/92  soft-tissue]
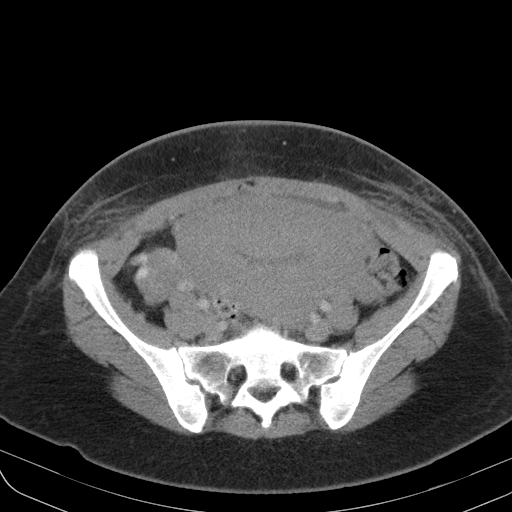
[im 43/92  soft-tissue]
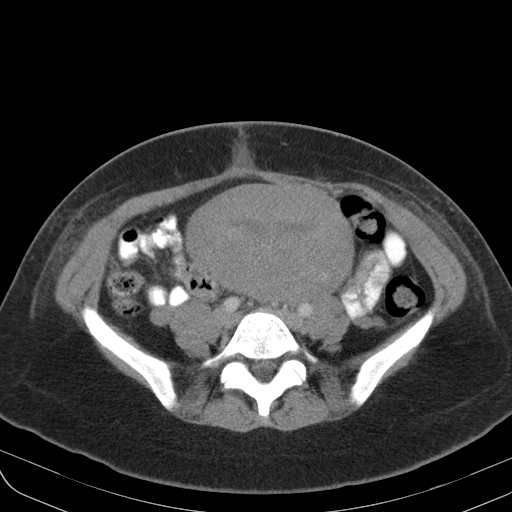
[im 49/92  soft-tissue]
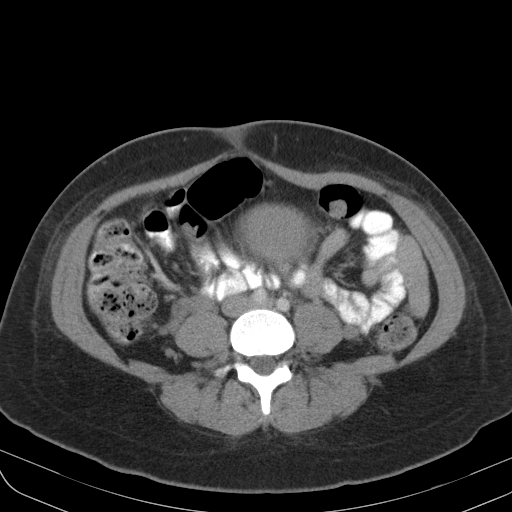
[im 55/92  soft-tissue]
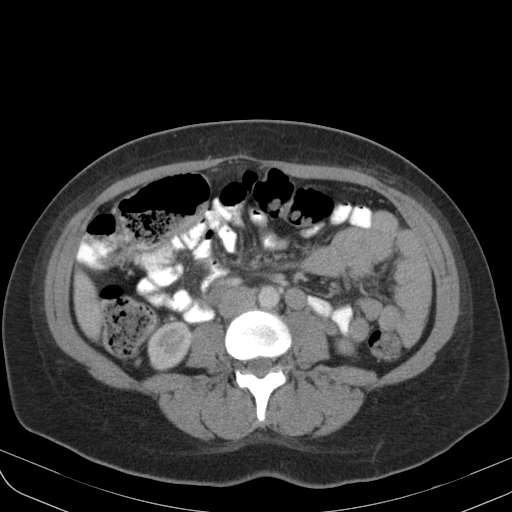
[im 61/92  soft-tissue]
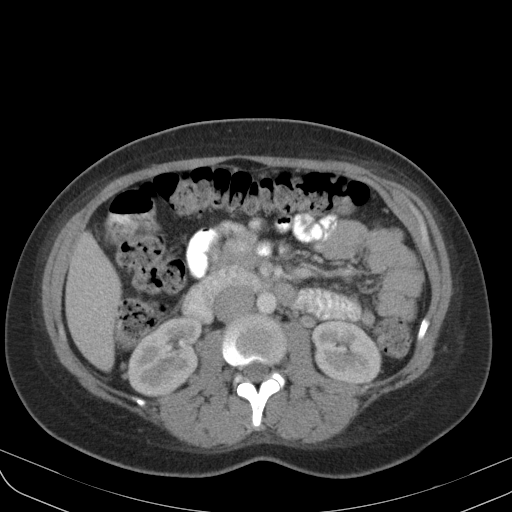
[im 61/92  bone]
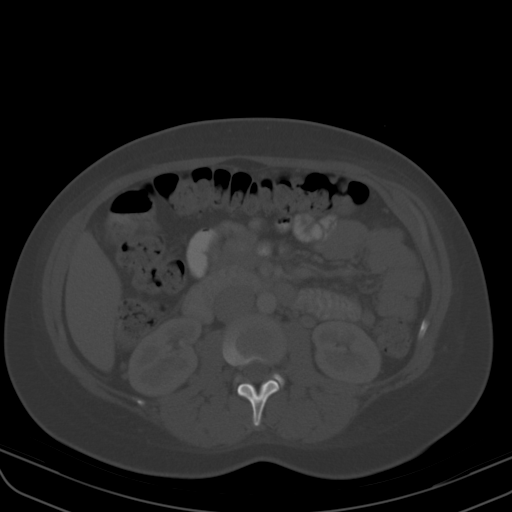
[im 67/92  lung]
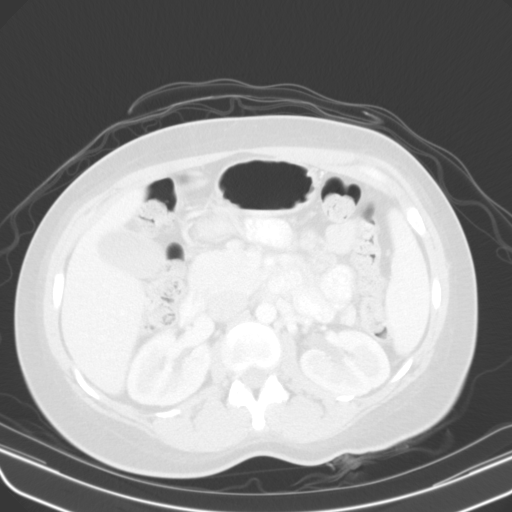
[im 73/92  soft-tissue]
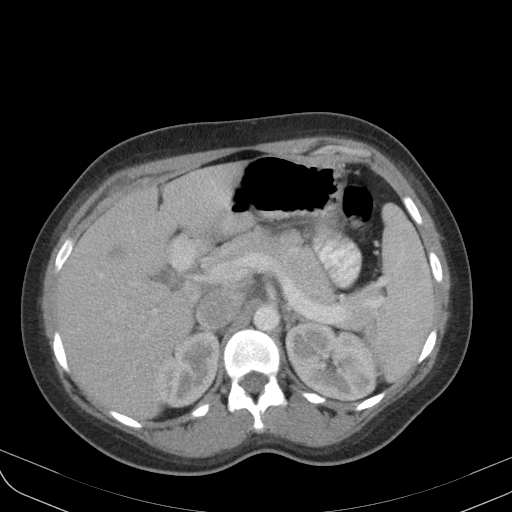
[im 73/92  lung]
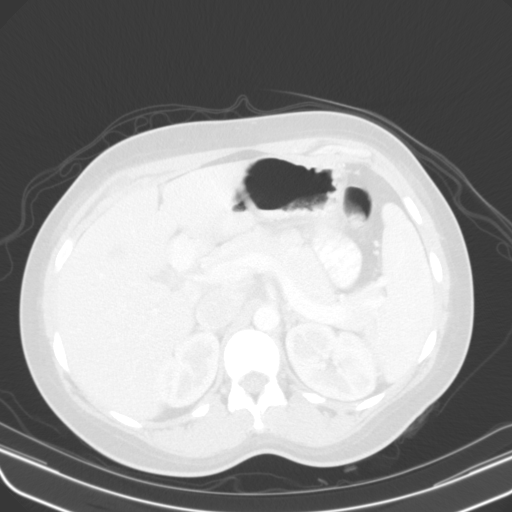
[im 79/92  soft-tissue]
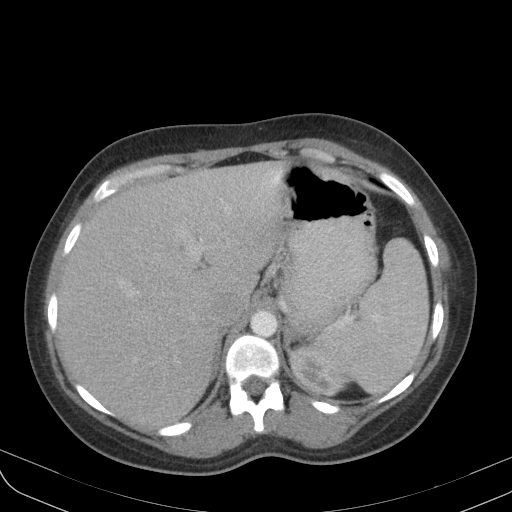
[im 79/92  lung]
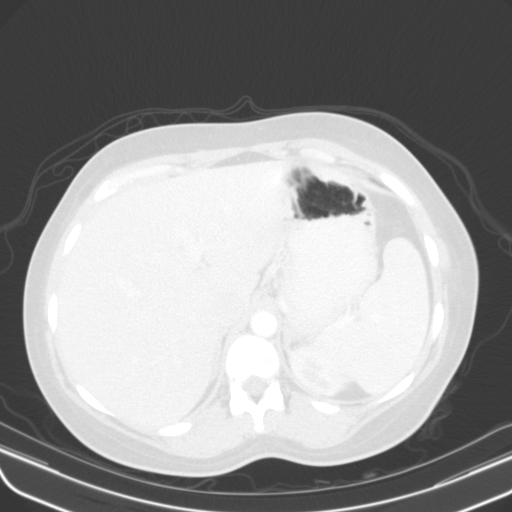
[im 85/92  soft-tissue]
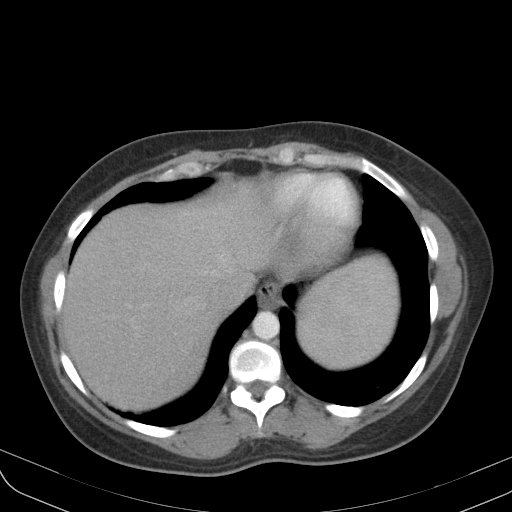
[im 85/92  lung]
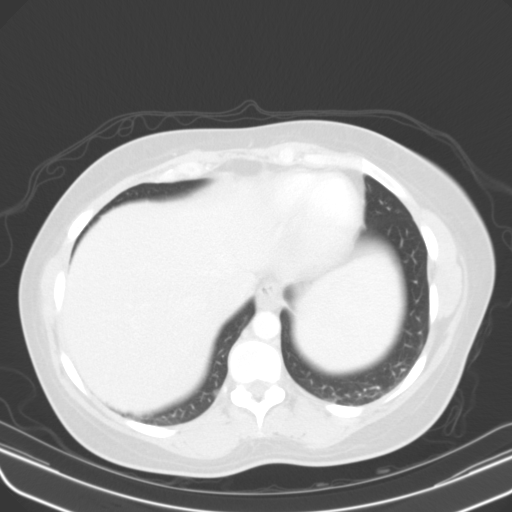

[13 of 32 positions shown; findings below may reference images not displayed]

FINDINGS: Lower chest: Lung bases clear

Hepatobiliary: Mass lesion RIGHT lobe liver near gallbladder fossa,
17 x 18 mm with early nodular peripheral enhancement and fill-in on
delayed imaging likely representing a small hemangioma. Nonspecific
low-attenuation focus lateral segment LEFT lobe liver 9 x 6 mm image
10. Gallbladder unremarkable.

Pancreas: Normal appearance

Spleen: Normal appearance

Adrenals/Urinary Tract: Tiny nonobstructing calculus mid RIGHT
kidney. Adrenal glands, kidneys, ureters, and bladder otherwise
normal appearance.

Stomach/Bowel: Normal appendix. Stomach and bowel loops
unremarkable.

Vascular/Lymphatic: Normal appearance

Reproductive: Enlarged uterus compatible with postpartum state.
Mildly prominent central low attenuation which may reflect
endometrium or residual fluid. Question nabothian cysts at cervix.
Unremarkable ovaries. Tubal ligation clips present bilaterally.

Other: Large intermediate attenuation subcutaneous collection in the
anterior pelvic wall 15.4 x 5.4 x 4.4 cm in size likely representing
a postoperative hematoma though cannot exclude infected hematoma by
CT. Scattered foci of postoperative gas in the anterior pelvis. No
intraperitoneal hematoma, fluid collection or abscess identified.
Scattered subcutaneous edema in the mid abdomen. No free
intraperitoneal air or fluid.

Musculoskeletal: Osseous structures unremarkable.
IMPRESSION: Large intermediate attenuation subcutaneous collection in the
anterior pelvis [DATE] x 5.4 x 4.4 cm in size most consistent with a
postoperative hematoma though cannot exclude infected hematoma by
CT.

No intrapelvic abnormalities.

Enlarged postpartum uterus.

Probable small hepatic hemangioma 17 x 18 mm in size within
additional nonspecific 9 mm LEFT lobe liver lesion.

Tiny nonobstructing RIGHT renal calculus.

## 2018-02-17 ENCOUNTER — Encounter: Payer: Self-pay | Admitting: Osteopathic Medicine

## 2018-02-17 ENCOUNTER — Other Ambulatory Visit (HOSPITAL_COMMUNITY)
Admission: RE | Admit: 2018-02-17 | Discharge: 2018-02-17 | Disposition: A | Discharge status: Home / Self Care | Source: Ambulatory Visit | Attending: Osteopathic Medicine | Admitting: Osteopathic Medicine

## 2018-02-17 ENCOUNTER — Ambulatory Visit (INDEPENDENT_AMBULATORY_CARE_PROVIDER_SITE_OTHER): Admitting: Osteopathic Medicine

## 2018-02-17 VITALS — BP 125/72 | HR 73 | Temp 98.3°F | Wt 164.0 lb

## 2018-02-17 DIAGNOSIS — Z0000 Encounter for general adult medical examination without abnormal findings: Secondary | ICD-10-CM

## 2018-02-17 DIAGNOSIS — Z124 Encounter for screening for malignant neoplasm of cervix: Secondary | ICD-10-CM | POA: Insufficient documentation

## 2018-02-17 DIAGNOSIS — Z1151 Encounter for screening for human papillomavirus (HPV): Secondary | ICD-10-CM | POA: Insufficient documentation

## 2018-02-17 NOTE — Progress Notes (Signed)
HPI: Bonnie White is a 32 y.o. female who  has a past medical history of Depression and Medical history non-contributory.  [facility], 02/17/18,  for chief complaint of: Annual Physical / Pap     Patient here for annual physical / wellness exam.  See preventive care reviewed as below.  Recent labs reviewed in detail with the patient.   Additional concerns today include:   None  Immunization History  Administered Date(s) Administered  . Influenza,inj,Quad PF,6+ Mos 09/25/2014, 05/22/2015, 04/24/2017  . Tdap 03/10/2013, 02/12/2015, 02/18/2017       Past medical, surgical, social and family history reviewed:  Patient Active Problem List   Diagnosis Date Noted  . Psychophysiological insomnia 08/08/2017  . Postpartum depression 07/07/2017  . Abdominal wall hematoma 04/30/2017  . Status post tubal ligation 04/24/2017  . Status post primary low transverse cesarean section 04/24/2017    Past Surgical History:  Procedure Laterality Date  . CESAREAN SECTION N/A 04/22/2017   Procedure: CESAREAN SECTION;  Surgeon: Levie HeritageStinson, Jacob J, DO;  [facility];  Service: Obstetrics;  Laterality: N/A;  . HEMATOMA EVACUATION N/A 05/01/2017   Procedure: EVACUATION HEMATOMA;  Surgeon: Adam PhenixArnold, James G, MD;  Location: [facility];  Service: Gynecology;  Laterality: N/A;  . TUBAL LIGATION    . WISDOM TOOTH EXTRACTION      Social History   Tobacco Use  . Smoking status: Former Smoker    Packs/day: 1.00    Years: 8.00    Pack years: 8.00    Types: Cigarettes  . Smokeless tobacco: Never Used  Substance Use Topics  . Alcohol use: No    Alcohol/week: 0.0 oz    Comment: rarely, maybe 2 per week     Family History  Problem Relation Age of Onset  . Ulcerative colitis Father   . Seizures Sister   . Breast cancer Paternal Aunt        mid-40's age of diagnosis  . Crohn's disease Paternal Grandmother      Current  medication list and allergy/intolerance information reviewed:    Current Outpatient Medications  Medication Sig Dispense Refill  . Cyanocobalamin (B-12) 100 MCG TABS Take by mouth.    . Prenatal Multivit-Min-Fe-FA (PRENATAL VITAMINS PO) Take 1 tablet by mouth daily.     . sertraline (ZOLOFT) 50 MG tablet Take 1 tablet (50 mg total) by mouth daily. 90 tablet 3  . traZODone (DESYREL) 50 MG tablet Take 0.5 tablets (25 mg total) by mouth at bedtime as needed for sleep. 45 tablet 0  . vitamin C (ASCORBIC ACID) 250 MG tablet Take 250 mg by mouth daily.     No current facility-administered medications for this visit.     No Known Allergies    Review of Systems:  Constitutional:  No  fever, no chills, No recent illness  HEENT: No  headache, no vision change  Cardiac: No  chest pain, No  pressure, No palpitation  Respiratory:  No  shortness of breath. No  Cough  Gastrointestinal: No  abdominal pain, No  nausea, No  vomiting,  No  blood in stool, No  diarrhea, No  constipation   Musculoskeletal: No new myalgia/arthralgia  Skin: No  Rash, No other wounds/concerning lesions  Genitourinary: No  incontinence, No  abnormal genital bleeding, No abnormal genital discharge  Hem/Onc: No  easy bruising/bleeding, No  abnormal lymph node  Endocrine: No cold intolerance,  No heat intolerance. No polyuria/polydipsia/polyphagia  Neurologic: No  weakness, No  dizziness, No  slurred speech/focal weakness/facial droop  Psychiatric: No  concerns with depression, No  concerns with anxiety, No sleep problems, No mood problems  Exam:  BP 125/72 (BP Location: Left Arm, Patient Position: Sitting, Cuff Size: Normal)   Pulse 73   Temp 98.3 F (36.8 C) (Oral)   Wt 164 lb (74.4 kg)   BMI 28.15 kg/m   Constitutional: VS see above. General Appearance: alert, well-developed, well-nourished, NAD  Eyes: Normal lids and conjunctive, non-icteric sclera  Ears, Nose, Mouth, Throat: MMM, Normal external  inspection ears/nares/mouth/lips/gums. TM normal bilaterally. Pharynx/tonsils no erythema, no exudate. Nasal mucosa normal.   Neck: No masses, trachea midline. No thyroid enlargement. No tenderness/mass appreciated. No lymphadenopathy  Respiratory: Normal respiratory effort. no wheeze, no rhonchi, no rales  Cardiovascular: S1/S2 normal, no murmur, no rub/gallop auscultated. RRR. No lower extremity edema. Pedal pulse II/IV bilaterally DP and PT. No carotid bruit or JVD. No abdominal aortic bruit.  Gastrointestinal: Nontender, no masses. No hepatomegaly, no splenomegaly. No hernia appreciated. Bowel sounds normal. Rectal exam deferred.   Musculoskeletal: Gait normal. No clubbing/cyanosis of digits.   Neurological: Normal balance/coordination. No tremor.   Skin: warm, dry, intact. No rash/ulcer.   Psychiatric: Normal judgment/insight. Normal mood and affect. Oriented x3.  GYN: No lesions/ulcers to external genitalia, normal urethra, normal vaginal mucosa, physiologic discharge, cervix normal without lesions, uterus not enlarged or tender, adnexa no masses and nontender   Results for orders placed or performed in visit on 01/06/18 (from the past 72 hour(s))  CBC     Status: None   Collection Time: 02/15/18  9:38 AM  Result Value Ref Range   WBC 4.8 3.8 - 10.8 Thousand/uL   RBC 4.32 3.80 - 5.10 Million/uL   Hemoglobin 13.4 11.7 - 15.5 g/dL   HCT 40.1 02.7 - 25.3 %   MCV 92.8 80.0 - 100.0 fL   MCH 31.0 27.0 - 33.0 pg   MCHC 33.4 32.0 - 36.0 g/dL   RDW 66.4 40.3 - 47.4 %   Platelets 218 140 - 400 Thousand/uL   MPV 10.9 7.5 - 12.5 fL  COMPLETE METABOLIC PANEL WITH GFR     Status: None   Collection Time: 02/15/18  9:38 AM  Result Value Ref Range   Glucose, Bld 84 65 - 99 mg/dL    Comment: .            Fasting reference interval .    BUN 23 7 - 25 mg/dL   Creat 2.59 5.63 - 8.75 mg/dL   GFR, Est Non African American 109 > OR = 60 mL/min/1.53m2   GFR, Est African American 126 > OR =  60 mL/min/1.91m2   BUN/Creatinine Ratio NOT APPLICABLE 6 - 22 (calc)   Sodium 137 135 - 146 mmol/L   Potassium 4.7 3.5 - 5.3 mmol/L   Chloride 103 98 - 110 mmol/L   CO2 25 20 - 32 mmol/L   Calcium 9.8 8.6 - 10.2 mg/dL   Total Protein 7.7 6.1 - 8.1 g/dL   Albumin 4.8 3.6 - 5.1 g/dL   Globulin 2.9 1.9 - 3.7 g/dL (calc)   AG Ratio 1.7 1.0 - 2.5 (calc)   Total Bilirubin 0.4 0.2 - 1.2 mg/dL   Alkaline phosphatase (APISO) 62 33 - 115 U/L   AST 15 10 - 30 U/L   ALT 9 6 - 29 U/L  Lipid panel     Status: Abnormal   Collection Time: 02/15/18  9:38  AM  Result Value Ref Range   Cholesterol 208 (H) <200 mg/dL   HDL 63 >16 mg/dL   Triglycerides 45 <109 mg/dL   LDL Cholesterol (Calc) 131 (H) mg/dL (calc)    Comment: Reference range: <100 . Desirable range <100 mg/dL for primary prevention;   <70 mg/dL for patients with CHD or diabetic patients  with > or = 2 CHD risk factors. Marland Kitchen LDL-C is now calculated using the Martin-Hopkins  calculation, which is a validated novel method providing  better accuracy than the Friedewald equation in the  estimation of LDL-C.  Horald Pollen et al. Lenox Ahr. 6045;409(81): 2061-2068  (http://education.[employer].com/faq/FAQ164)    Total CHOL/HDL Ratio 3.3 <5.0 (calc)   Non-HDL Cholesterol (Calc) 145 (H) <130 mg/dL (calc)    Comment: For patients with diabetes plus 1 major ASCVD risk  factor, treating to a non-HDL-C goal of <100 mg/dL  (LDL-C of <19 mg/dL) is considered a therapeutic  option.       ASSESSMENT/PLAN:    Annual physical exam - Plan: Cytology - PAP  Cervical cancer screening - Plan: Cytology - PAP    Patient Instructions  General Preventive Care  Most recent routine screening lipids/other labs:   Tobacco: don't! Alcohol: moderation is ok for most people. Recreational/Illicit Drugs: don't!   Exercise: as tolerated to reduce risk of cardiovascular disease and diabetes  Mental health: if need for mental health care (medicines,  counseling, other), or concerns about moods, please let me know!   Sexual health: if need for STD testing, or concerns about pain/libido or other problems, please let me know!   Vaccines  Flu vaccine: recommended every fall (by Halloween!)  Shingles vaccine: Shingrix recommended after age 84  Pneumonia vaccines: Prevnar and Pneumovax recommended after age 4  Tetanus booster: Tdap recommended every 10 years - good until 2028  Cancer screenings   Colon cancer screening: recommended at age 35, colonoscopy sooner if risk factors   Breast cancer screening: mammogram recommended at age 48  Cervical cancer screening: every 1 to 5 years depending on previous results, age and other risk factors.   Infection screenings . HIV: recommended screening at least once age 9-65, more often if risk factors  . Gonorrhea/Chlamydia: many insurances require testing for anyone on birth control pills, otherwise screening as needed  Other . Bone Density Test: recommended for women at age 48, men at age 69, sooner depending on risk factors . Advanced Directive: Living Will and/or Healthcare Power of Attorney recommended for everyone, regardless of age or health . Cholesterol & Diabetes: recommended screening annually . Thyroid and Vitamin D: routine screening not recommended, most insurance will not cover this testing, if you are concerned about cost please ask the lab about coverage prior to blood draw          Visit summary with medication list and pertinent instructions was printed for patient to review. All questions at time of visit were answered - patient instructed to contact office with any additional concerns. ER/RTC precautions were reviewed with the patient.   Follow-up plan: Return in about 1 year (around 02/18/2019) for Annual physical/wellness visit, sooner if needed should any problems arise.    Please note: voice recognition software was used to produce this document, and typos  may escape review. Please contact Dr. Lyn Hollingshead for any needed clarifications.

## 2018-02-17 NOTE — Patient Instructions (Addendum)
General Preventive Care  Most recent routine screening lipids/other labs:   Tobacco: don't! Alcohol: moderation is ok for most people. Recreational/Illicit Drugs: don't!   Exercise: as tolerated to reduce risk of cardiovascular disease and diabetes  Mental health: if need for mental health care (medicines, counseling, other), or concerns about moods, please let me know!   Sexual health: if need for STD testing, or concerns about pain/libido or other problems, please let me know!   Vaccines  Flu vaccine: recommended every fall (by Halloween!)  Shingles vaccine: Shingrix recommended after age 32  Pneumonia vaccines: Prevnar and Pneumovax recommended after age 32  Tetanus booster: Tdap recommended every 10 years - good until 2028  Cancer screenings   Colon cancer screening: recommended at age 32, colonoscopy sooner if risk factors   Breast cancer screening: mammogram recommended at age 32  Cervical cancer screening: every 1 to 5 years depending on previous results, age and other risk factors.   Infection screenings . HIV: recommended screening at least once age 32-65, more often if risk factors  . Gonorrhea/Chlamydia: many insurances require testing for anyone on birth control pills, otherwise screening as needed  Other . Bone Density Test: recommended for women at age 32, men at age 32, sooner depending on risk factors . Advanced Directive: Living Will and/or Healthcare Power of Attorney recommended for everyone, regardless of age or health . Cholesterol & Diabetes: recommended screening annually . Thyroid and Vitamin D: routine screening not recommended, most insurance will not cover this testing, if you are concerned about cost please ask the lab about coverage prior to blood draw

## 2018-02-19 LAB — CYTOLOGY - PAP
Diagnosis: NEGATIVE
HPV: NOT DETECTED

## 2018-02-21 NOTE — Progress Notes (Signed)
Call patient: Your Pap smear is normal. Repeat in 5 years.

## 2018-02-22 NOTE — Progress Notes (Signed)
Pt has seen results on MyChart and message also sent for patient to call back if any questions.

## 2018-03-05 ENCOUNTER — Other Ambulatory Visit: Payer: Self-pay | Admitting: Osteopathic Medicine

## 2018-03-05 DIAGNOSIS — F5104 Psychophysiologic insomnia: Secondary | ICD-10-CM

## 2018-03-06 ENCOUNTER — Other Ambulatory Visit: Payer: Self-pay | Admitting: Osteopathic Medicine

## 2018-03-06 DIAGNOSIS — F53 Postpartum depression: Secondary | ICD-10-CM

## 2018-03-06 DIAGNOSIS — O99345 Other mental disorders complicating the puerperium: Principal | ICD-10-CM

## 2018-03-07 ENCOUNTER — Emergency Department (INDEPENDENT_AMBULATORY_CARE_PROVIDER_SITE_OTHER)
Admission: EM | Admit: 2018-03-07 | Discharge: 2018-03-07 | Disposition: A | Discharge status: Home / Self Care | Source: Home / Self Care | Attending: Emergency Medicine | Admitting: Emergency Medicine

## 2018-03-07 ENCOUNTER — Other Ambulatory Visit: Payer: Self-pay

## 2018-03-07 DIAGNOSIS — J020 Streptococcal pharyngitis: Secondary | ICD-10-CM

## 2018-03-07 DIAGNOSIS — J029 Acute pharyngitis, unspecified: Secondary | ICD-10-CM | POA: Diagnosis not present

## 2018-03-07 DIAGNOSIS — J387 Other diseases of larynx: Secondary | ICD-10-CM

## 2018-03-07 LAB — POCT RAPID STREP A (OFFICE): Rapid Strep A Screen: NEGATIVE

## 2018-03-07 MED ORDER — FIRST-DUKES MOUTHWASH MT SUSP: OROMUCOSAL | 1 refills | Status: DC

## 2018-03-07 NOTE — Discharge Instructions (Signed)
Gargle with warm salt water. Culture will be sent. You can try hydrocortisone cream to your neck.

## 2018-03-07 NOTE — ED Triage Notes (Signed)
32 y.o complains of sore throat and rash.

## 2018-03-07 NOTE — ED Provider Notes (Signed)
[facility]    CSN: [id number] Arrival date & time: 03/07/18  1414     History   Chief Complaint Chief Complaint  Patient presents with  . Sore Throat    rash    HPI Bonnie White is a 32 y.o. female.  Patient had the onset of sore throat on Thursday.  She has also felt a rash on her neck.  This is nonpruritic with small bumps on her neck.  She has not had a fever but does feel like she has some type of virus in her system.  Sore Throat     Past Medical History:  Diagnosis Date  . Depression   . Medical history non-contributory     Patient Active Problem List   Diagnosis Date Noted  . Psychophysiological insomnia 08/08/2017  . Postpartum depression 07/07/2017  . Abdominal wall hematoma 04/30/2017  . Status post tubal ligation 04/24/2017  . Status post primary low transverse cesarean section 04/24/2017    Past Surgical History:  Procedure Laterality Date  . CESAREAN SECTION N/A 04/22/2017   Procedure: CESAREAN SECTION;  Surgeon: Levie HeritageStinson, Jacob J, DO;  [facility];  Service: Obstetrics;  Laterality: N/A;  . HEMATOMA EVACUATION N/A 05/01/2017   Procedure: EVACUATION HEMATOMA;  Surgeon: Adam PhenixArnold, James G, MD;  Location: [facility];  Service: Gynecology;  Laterality: N/A;  . TUBAL LIGATION    . WISDOM TOOTH EXTRACTION      OB History    Gravida  3   Para  3   Term  3   Preterm      AB      Living  3     SAB      TAB      Ectopic      Multiple  0   Live Births  3            Home Medications    Prior to Admission medications   Medication Sig Start Date End Date Taking? Authorizing Provider  Cyanocobalamin (B-12) 100 MCG TABS Take by mouth.    [provider]  Diphenhyd-Hydrocort-Nystatin (FIRST-DUKES MOUTHWASH) SUSP 1 teaspoon his rinse gargle and spit 4 times a day. 03/07/18   Collene Gobbleaub, Kendell Gammon A, MD  Prenatal Multivit-Min-Fe-FA (PRENATAL VITAMINS PO) Take 1 tablet by mouth daily.     [provider]    sertraline (ZOLOFT) 50 MG tablet Take 1 tablet (50 mg total) by mouth daily. 01/06/18   Sunnie NielsenAlexander, Natalie, DO  traZODone (DESYREL) 50 MG tablet Take 0.5 tablets (25 mg total) by mouth at bedtime as needed for sleep. 01/06/18   Sunnie NielsenAlexander, Natalie, DO  traZODone (DESYREL) 50 MG tablet TAKE 1/2 TO 1 TABLET(25 TO 50 MG) BY MOUTH AT BEDTIME 03/05/18   Sunnie NielsenAlexander, Natalie, DO  vitamin C (ASCORBIC ACID) 250 MG tablet Take 250 mg by mouth daily.    [provider]    Family History Family History  Problem Relation Age of Onset  . Ulcerative colitis Father   . Seizures Sister   . Breast cancer Paternal Aunt        mid-40's age of diagnosis  . Crohn's disease Paternal Grandmother     Social History Social History   Tobacco Use  . Smoking status: Former Smoker    Packs/day: 1.00    Years: 8.00    Pack years: 8.00    Types: Cigarettes  . Smokeless tobacco: Never Used  Substance Use Topics  . Alcohol use: No  Alcohol/week: 0.0 oz    Comment: rarely, maybe 2 per week   . Drug use: No     Allergies   Patient has no known allergies.   Review of Systems Review of Systems  Constitutional: Positive for fatigue. Negative for fever.  HENT: Positive for sore throat.   Eyes: Negative.   Respiratory: Negative.   Gastrointestinal: Negative.   Skin: Positive for rash.     Physical Exam Triage Vital Signs ED Triage Vitals  Enc Vitals Group     BP 03/07/18 1430 (!) 153/90/120/80     Pulse Rate 03/07/18 1430 63     Resp --      Temp 03/07/18 1430 98 F (36.7 C)     Temp Source 03/07/18 1430 Oral     SpO2 03/07/18 1430 97 %     Weight 03/07/18 1431 161 lb (73 kg)     Height 03/07/18 1431 5\' 4"  (1.626 m)     Head Circumference --      Peak Flow --      Pain Score 03/07/18 1431 5     Pain Loc --      Pain Edu? --      Excl. in GC? --    No data found.  Updated Vital Signs BP (!) 153/90 (BP Location: Right Arm)   Pulse 63   Temp 98 F (36.7 C) (Oral)   Ht 5\' 4"   (1.626 m)   Wt 161 lb (73 kg)   SpO2 97%   BMI 27.64 kg/m   Visual Acuity Right Eye Distance:   Left Eye Distance:   Bilateral Distance:    Right Eye Near:   Left Eye Near:    Bilateral Near:     Physical Exam  Constitutional: She appears well-developed and well-nourished.  HENT:  Nose is normal.  There is a 6 x 7 mm shallow ulceration of the left anterior tonsillar pillar.  Posterior pharynx is clear.  TMs are clear.  There is a fine maculopapular rash involving the neck and upper anterior chest.  Neck: Normal range of motion. Neck supple.  Cardiovascular: Normal rate.  Pulmonary/Chest: Effort normal and breath sounds normal.     UC Treatments / Results  Labs (all labs ordered are listed, but only abnormal results are displayed) Labs Reviewed  STREP A DNA PROBE  POCT RAPID STREP A (OFFICE)    EKG None  Radiology No results found.  Procedures Procedures (including critical care time)  Medications Ordered in UC Medications - No data to display  Initial Impression / Assessment and Plan / UC Course  I have reviewed the triage vital signs and the nursing notes.  Pertinent labs & imaging results that were available during my care of the patient were reviewed by me and considered in my medical decision making (see chart for details). Strep test is negative.  She does have an ulceration of the left anterior tonsillar pillar.  She has a fine rash on her neck.  I suspect she has some viral type illness.  Culture will be sent.  It is listed as a diagnosis of strep throat but this was a mistake.     Final Clinical Impressions(s) / UC Diagnoses   Final diagnoses:  Acute streptococcal pharyngitis  Pharyngitis, unspecified etiology  Ulcer of the throat     Discharge Instructions     Gargle with warm salt water    ED Prescriptions    Medication Sig Dispense Auth. Provider   Diphenhyd-Hydrocort-Nystatin (  FIRST-DUKES MOUTHWASH) SUSP 1 teaspoon his rinse gargle and  spit 4 times a day. 120 mL Collene Gobble, MD     Controlled Substance Prescriptions [place] Controlled Substance Registry consulted? Not Applicable   Collene Gobble, MD 03/07/18 1534

## 2018-03-08 ENCOUNTER — Telehealth: Payer: Self-pay | Admitting: *Deleted

## 2018-03-08 LAB — STREP A DNA PROBE: Group A Strep Probe: NOT DETECTED

## 2018-03-08 NOTE — Telephone Encounter (Signed)
Callback: LMOM f/ from visit. TCX negative. Call back as needed.

## 2018-04-04 ENCOUNTER — Other Ambulatory Visit: Payer: Self-pay | Admitting: Osteopathic Medicine

## 2018-04-04 DIAGNOSIS — F5104 Psychophysiologic insomnia: Secondary | ICD-10-CM

## 2018-06-03 ENCOUNTER — Other Ambulatory Visit: Payer: Self-pay | Admitting: Osteopathic Medicine

## 2018-06-03 DIAGNOSIS — F53 Postpartum depression: Secondary | ICD-10-CM

## 2018-06-03 DIAGNOSIS — O99345 Other mental disorders complicating the puerperium: Principal | ICD-10-CM

## 2018-06-03 NOTE — Telephone Encounter (Signed)
PEC not do refills for this provider. 

## 2019-05-29 ENCOUNTER — Other Ambulatory Visit: Payer: Self-pay | Admitting: Osteopathic Medicine

## 2019-05-29 DIAGNOSIS — F53 Postpartum depression: Secondary | ICD-10-CM

## 2019-05-29 DIAGNOSIS — F5104 Psychophysiologic insomnia: Secondary | ICD-10-CM

## 2019-07-27 ENCOUNTER — Other Ambulatory Visit: Payer: Self-pay

## 2019-07-27 ENCOUNTER — Encounter: Payer: Self-pay | Admitting: Emergency Medicine

## 2019-07-27 ENCOUNTER — Emergency Department
Admission: EM | Admit: 2019-07-27 | Discharge: 2019-07-27 | Disposition: A | Discharge status: Home / Self Care | Source: Home / Self Care

## 2019-07-27 NOTE — ED Triage Notes (Signed)
Patient states that when she awoke this morning, she noticed a lump on the left side of her jawline, that moves, painful to touch, no injury.

## 2019-08-17 ENCOUNTER — Ambulatory Visit: Attending: Internal Medicine

## 2019-08-17 DIAGNOSIS — Z20822 Contact with and (suspected) exposure to covid-19: Secondary | ICD-10-CM

## 2019-08-18 ENCOUNTER — Telehealth: Payer: Self-pay | Admitting: Osteopathic Medicine

## 2019-08-18 NOTE — Telephone Encounter (Signed)
As per Dr. Lyn Hollingshead - ok to schedule pt in an acute slot. Thanks.

## 2019-08-18 NOTE — Telephone Encounter (Signed)
MYCHART MESSAGE - past several months have felt like my depression and anxiety have gotten worse and worse. Especially around my time of the month. My husband and I have done some research online and PMDD, and possibly manic depression seem to be possibilities. My last 3 cycles I have gotten so irritable, angry, felt like I have no control over my mood, emotions or attitude.   Patient is wanting an an appointment between 1/20 and 1/22. Should I use an acute slot or Dr. Roberts Gaudy next available?

## 2019-08-19 LAB — NOVEL CORONAVIRUS, NAA: SARS-CoV-2, NAA: DETECTED — AB

## 2019-08-19 NOTE — Telephone Encounter (Signed)
Okay, appointment has been scheduled.  Thanks.

## 2019-09-01 ENCOUNTER — Encounter: Payer: Self-pay | Admitting: Osteopathic Medicine

## 2019-09-01 ENCOUNTER — Ambulatory Visit (INDEPENDENT_AMBULATORY_CARE_PROVIDER_SITE_OTHER): Admitting: Osteopathic Medicine

## 2019-09-01 DIAGNOSIS — O99345 Other mental disorders complicating the puerperium: Secondary | ICD-10-CM

## 2019-09-01 DIAGNOSIS — F53 Postpartum depression: Secondary | ICD-10-CM | POA: Diagnosis not present

## 2019-09-01 MED ORDER — SERTRALINE HCL 100 MG PO TABS: ORAL_TABLET | ORAL | 1 refills | Status: DC

## 2019-09-01 NOTE — Progress Notes (Signed)
Virtual Visit via Video   I connected with      Bonnie White on 09/01/19 at 3:54 PM  by a telemedicine application and verified that I am speaking with the correct person using two identifiers.  Patient is at home I am in office   I discussed the limitations of evaluation and management by telemedicine and the availability of in person appointments. The patient expressed understanding and agreed to proceed.  History of Present Illness: Bonnie White is a 34 y.o. female who would like to discuss mental health   Has noticed significantly increased anxiety/irritability particularly within a few days of her periods.       Observations/Objective: Temp 98.4 F (36.9 C) (Oral)   Wt 165 lb (74.8 kg)   LMP 08/05/2019   BMI 28.32 kg/m  BP Readings from Last 3 Encounters:  07/27/19 (!) 145/83  03/07/18 (!) 153/90  02/17/18 125/72   Exam: Normal Speech.  NAD  Lab and Radiology Results No results found for this or any previous visit (from the past 72 hour(s)). No results found.     Assessment and Plan: 34 y.o. female with The encounter diagnosis was Postpartum depression.   PDMP not reviewed this encounter. No orders of the defined types were placed in this encounter.  Meds ordered this encounter  Medications  . sertraline (ZOLOFT) 100 MG tablet    Sig: 100 mg daily all the time OR in 2 weeks from first day of menstrual period increase from 50 mg to 100 mg daily, then on first day of next menstrual period reduce back down to 50 mg daily, and repeat this cycle    Dispense:  90 tablet    Refill:  1   Patient Instructions  Symptoms consistent with PMDD AKA premenstrual dysphoric disorder.  Can try increasing the Zoloft in a couple of ways:  100 mg daily all the time  In 2 weeks from first day of menstrual period, increase from 50 mg to 100 mg daily, then on first day of next period reduce back down to 50 mg daily, and repeat this cycle  If increasing the  Zoloft does not help, or helps a bit but does not really alleviate the issue to your satisfaction, we may consider adding a birth control pill to help stabilize moods as well as decrease bleeding.  It is also worth getting blood work, orders are in, please come to the lab at your convenience!  Let us give this a little time to see how you do, plan to recheck mental health with virtual visit in 1 to 2 months, or sooner if needed.  Please call us to schedule an appointment!   Instructions sent via MyChart. If MyChart not available, pt was given option for info via personal e-mail w/ no guarantee of protected health info over unsecured e-mail communication, and MyChart sign-up instructions were sent to patient.   Follow Up Instructions: Return in about 2 months (around 10/30/2019) for virtual visit, recheck mental health..    I discussed the assessment and treatment plan with the patient. The patient was provided an opportunity to ask questions and all were answered. The patient agreed with the plan and demonstrated an understanding of the instructions.   The patient was advised to call back or seek an in-person evaluation if any new concerns, if symptoms worsen or if the condition fails to improve as anticipated.  30 minutes of non-face-to-face time was provided during this encounter.      . . . . . . . . . . . . . Marland Kitchen  Historical information moved to improve visibility of documentation.  Past Medical History:  Diagnosis Date  . Depression   . Medical history non-contributory    Past Surgical History:  Procedure Laterality Date  . CESAREAN SECTION N/A 04/22/2017   Procedure: CESAREAN SECTION;  Surgeon: Levie Heritage, DO;  [facility]: [facility];  Service: Obstetrics;  Laterality: N/A;  . HEMATOMA EVACUATION N/A 05/01/2017   Procedure: EVACUATION HEMATOMA;  Surgeon: Adam Phenix, MD;  Location: [facility];  Service: Gynecology;   Laterality: N/A;  . TUBAL LIGATION    . WISDOM TOOTH EXTRACTION     Social History   Tobacco Use  . Smoking status: Former Smoker    Packs/day: 1.00    Years: 8.00    Pack years: 8.00    Types: Cigarettes  . Smokeless tobacco: Never Used  Substance Use Topics  . Alcohol use: No    Alcohol/week: 0.0 standard drinks    Comment: rarely, maybe 2 per week    family history includes Breast cancer in her paternal aunt; Crohn's disease in her paternal grandmother; Seizures in her sister; Ulcerative colitis in her father.  Medications: Current Outpatient Medications  Medication Sig Dispense Refill  . Cyanocobalamin (B-12) 100 MCG TABS Take by mouth.    . Prenatal Multivit-Min-Fe-FA (PRENATAL VITAMINS PO) Take 1 tablet by mouth daily.     . sertraline (ZOLOFT) 100 MG tablet 100 mg daily all the time OR in 2 weeks from first day of menstrual period increase from 50 mg to 100 mg daily, then on first day of next menstrual period reduce back down to 50 mg daily, and repeat this cycle 90 tablet 1  . vitamin C (ASCORBIC ACID) 250 MG tablet Take 250 mg by mouth daily.    . Diphenhyd-Hydrocort-Nystatin (FIRST-DUKES MOUTHWASH) SUSP 1 teaspoon his rinse gargle and spit 4 times a day. (Patient not taking: Reported on 09/01/2019) 120 mL 1  . traZODone (DESYREL) 50 MG tablet TAKE 1/2 TO 1 TABLET(25 TO 50 MG) BY MOUTH AT BEDTIME (Patient not taking: Reported on 09/01/2019) 90 tablet 0   No current facility-administered medications for this visit.   No Known Allergies

## 2019-09-01 NOTE — Patient Instructions (Addendum)
Symptoms consistent with PMDD AKA premenstrual dysphoric disorder.  Can try increasing the Zoloft in a couple of ways:  100 mg daily all the time  In 2 weeks from first day of menstrual period, increase from 50 mg to 100 mg daily, then on first day of next period reduce back down to 50 mg daily, and repeat this cycle  If increasing the Zoloft does not help, or helps a bit but does not really alleviate the issue to your satisfaction, we may consider adding a birth control pill to help stabilize moods as well as decrease bleeding.  It is also worth getting blood work, orders are in, please come to the lab at your convenience!  Let us give this a little time to see how you do, plan to recheck mental health with virtual visit in 1 to 2 months, or sooner if needed.  Please call us to schedule an appointment!

## 2019-11-08 ENCOUNTER — Ambulatory Visit (INDEPENDENT_AMBULATORY_CARE_PROVIDER_SITE_OTHER): Admitting: Advanced Practice Midwife

## 2019-11-08 ENCOUNTER — Other Ambulatory Visit: Payer: Self-pay

## 2019-11-08 VITALS — BP 156/99 | HR 76 | Ht 64.0 in | Wt 169.0 lb

## 2019-11-08 DIAGNOSIS — N939 Abnormal uterine and vaginal bleeding, unspecified: Secondary | ICD-10-CM

## 2019-11-08 DIAGNOSIS — I10 Essential (primary) hypertension: Secondary | ICD-10-CM

## 2019-11-08 MED ORDER — MEGESTROL ACETATE 40 MG PO TABS: 40.0000 mg | ORAL_TABLET | Freq: Two times a day (BID) | ORAL | 2 refills | Status: DC

## 2019-11-08 NOTE — Progress Notes (Signed)
  GYNECOLOGY PROGRESS NOTE  History:  34 y.o. G3P3003 presents to [facility] office today for problem gyn visit. She reports heavy painful periods x 3-4 months.  She had regular menses, not heavy prior to pregnancies then 3 pregnancies with contraception and/or breastfeeding causing irregular menses in between.  She weaned her daughter last year and periods were regular x 3-4 months, then in late 2020/early 2021 she had a heavy period followed by another period 2 weeks later and this pattern of irregular, heavy bleeding associated with severe cramping has continued x 3-4 months. She denies h/a, dizziness, shortness of breath, chest pain, n/v, or fever/chills.    The following portions of the patient's history were reviewed and updated as appropriate: allergies, current medications, past family history, past medical history, past social history, past surgical history and problem list. Last pap smear on 2019 was normal, negative HRHPV.  Review of Systems:  Pertinent items are noted in HPI.   Objective:  Physical Exam Blood pressure (!) 156/99, pulse 76, height 5\' 4"  (1.626 m), weight 169 lb (76.7 kg), last menstrual period 10/17/2019, not currently breastfeeding. VS reviewed, nursing note reviewed,  Constitutional: well developed, well nourished, no distress HEENT: normocephalic CV: normal rate Pulm/chest wall: normal effort Breast Exam: deferred Abdomen: soft Neuro: alert and oriented x 3 Skin: warm, dry Psych: affect normal Pelvic exam: Cervix pink, visually closed, without lesion, scant white creamy discharge, vaginal walls and external genitalia normal Bimanual exam: Cervix 0/long/high, firm, anterior, neg CMT, uterus nontender, nonenlarged, adnexa without tenderness, enlargement, or mass  Assessment & Plan:  1. Abnormal uterine bleeding (AUB) --Recent changes to bleeding pattern  --Family hx of fibroids. Exam today with enlarged uterus, ? Fibroids so will follow up with  10/19/2019 --Discussed options including PO medications, IUD, ablation, hysterectomy.  Pt does not want to do medications long term and IUD caused discomfort for she and her husband and she has BTL so does not need contraception.  She desires ablation as this worked for family members with fibroids. --Appt with MD after [place] results to discuss surgical options including ablation. - US PELVIS (TRANSABDOMINAL ONLY); Future - megestrol (MEGACE) 40 MG tablet; Take 1 tablet (40 mg total) by mouth 2 (two) times daily.  Dispense: 60 tablet; Refill: 2   2. Hypertension, unspecified type --No dx but previous PCP visit with HTN.  No chest pain, shortness of breath or other cardiac symptoms today.  Recommend pt follow up with PCP as soon as possible.  [place], CNM 5:16 PM

## 2019-11-08 NOTE — Progress Notes (Signed)
Repeat BP 168/102

## 2019-11-09 ENCOUNTER — Ambulatory Visit (INDEPENDENT_AMBULATORY_CARE_PROVIDER_SITE_OTHER)

## 2019-11-09 DIAGNOSIS — N939 Abnormal uterine and vaginal bleeding, unspecified: Secondary | ICD-10-CM | POA: Diagnosis not present

## 2019-11-14 ENCOUNTER — Ambulatory Visit (INDEPENDENT_AMBULATORY_CARE_PROVIDER_SITE_OTHER): Admitting: Osteopathic Medicine

## 2019-11-14 ENCOUNTER — Encounter: Payer: Self-pay | Admitting: Osteopathic Medicine

## 2019-11-14 ENCOUNTER — Other Ambulatory Visit: Payer: Self-pay

## 2019-11-14 VITALS — BP 162/103 | HR 73 | Wt 168.0 lb

## 2019-11-14 DIAGNOSIS — N939 Abnormal uterine and vaginal bleeding, unspecified: Secondary | ICD-10-CM

## 2019-11-14 DIAGNOSIS — Z9851 Tubal ligation status: Secondary | ICD-10-CM

## 2019-11-14 DIAGNOSIS — I10 Essential (primary) hypertension: Secondary | ICD-10-CM

## 2019-11-14 NOTE — Patient Instructions (Addendum)
Plan:  EKG today looks good!  Will get labs today as well.   If no obvious secondary (aka weird) reason for high blood pressure, will start medications and see how it goes.   I will call you and let you know what labs are looking like, and send medication directly to the pharmacy if needed.  Assuming we go ahead and start medications, will plan to have you follow-up in the office in 1 week, we can recheck your blood pressure then to see how the medications are working.    Please bring your home blood pressure machine with you to that visit so that we can verify it against our equipment.

## 2019-11-14 NOTE — Progress Notes (Signed)
Bonnie White is a 34 y.o. female who presents to  [place] at [facility]  today, 11/14/19, seeking care for the following:  Concern for BP elevated at OBGYN (156/99 on 11/08/2019) and at home (ranging from systolic 161-096 / diastolic 04-540).  Desiring endometrial ablation for heavy painful periods, GYN understandably hesitant to do this until elevated blood pressure is addressed.   ASSESSMENT & PLAN with other pertinent history/findings:  The primary encounter diagnosis was Essential hypertension. Diagnoses of Status post tubal ligation and Abnormal uterine bleeding (AUB) were also pertinent to this visit.  Possible FH HTN NO personal Hx gestational hypertension, pre-eclampsia/eclampsia, HELLP. NO headache, sweating, tachycardia.  A bit older than age of onset for secondary HTN    Patient Instructions  Plan:  EKG today looks good!  Will get labs today as well.   If no obvious secondary (aka weird) reason for high blood pressure, will start medications and see how it goes.   I will call you and let you know what labs are looking like, and send medication directly to the pharmacy if needed.  Assuming we go ahead and start medications, will plan to have you follow-up in the office in 1 week, we can recheck your blood pressure then to see how the medications are working.    Please bring your home blood pressure machine with you to that visit so that we can verify it against our equipment.     EKG interpretation: Rate: 70 Rhythm: sinus No ST/T changes concerning for acute ischemia/infarct  Previous EKG no tracings available for review     ABIGIAL [place] is seen 11/14/19 for peri-operative risk stratification.    Sherie Don denies chest pain at rest or with exertion, denies dyspnea at rest or with exertion,denies lower extremity edema, denies claudication or palpitations.  Patient has negative history of ischemic  heart disease, CHF, CVD, diabetes, alcohol or drug abuse, recent anticoagulation or antithrombotic use, personal or family history of chronic kidney disease.   Patient has negative history of undergoing cardiac stress test or catheterization, coronary revascularization.   Patient reports being able to perform all ADL's independently  Patient's cardiac risk factors include HTN.   According to the RCR I, this number of risk factors stratify as the patient to class 1 which carries with it a 3.9 percent risk of major cardiovascular complications within 30 days      Follow-up instructions: Return for RECHECK PENDING RESULTS / OTHERWISE RECHECK BP IN OFFICE IN 1 WEEK .                                         LMP 10/17/2019   Current Meds  Medication Sig  . Cyanocobalamin (B-12) 100 MCG TABS Take by mouth.  . megestrol (MEGACE) 40 MG tablet Take 1 tablet (40 mg total) by mouth 2 (two) times daily.  . Prenatal Multivit-Min-Fe-FA (PRENATAL VITAMINS PO) Take 1 tablet by mouth daily.   . sertraline (ZOLOFT) 100 MG tablet 100 mg daily all the time OR in 2 weeks from first day of menstrual period increase from 50 mg to 100 mg daily, then on first day of next menstrual period reduce back down to 50 mg daily, and repeat this cycle  . vitamin C (ASCORBIC ACID) 250 MG tablet Take 250 mg by mouth daily.    No results  found for this or any previous visit (from the past 72 hour(s)).  No results found.     All questions at time of visit were answered - patient instructed to contact office with any additional concerns or updates.  ER/RTC precautions were reviewed with the patient.  Please note: voice recognition software was used to produce this document, and typos may escape review. Please contact Dr. Lyn Hollingshead for any needed clarifications.

## 2019-11-14 NOTE — Addendum Note (Signed)
Addended by: Ardyth Man on: 11/14/2019 03:16 PM   Modules accepted: Orders

## 2019-11-18 MED ORDER — HYDROCHLOROTHIAZIDE 12.5 MG PO TABS: 12.5000 mg | ORAL_TABLET | Freq: Every day | ORAL | 0 refills | Status: DC

## 2019-11-18 NOTE — Addendum Note (Signed)
Addended by: Deirdre Pippins on: 11/18/2019 08:10 AM   Modules accepted: Orders

## 2019-11-21 LAB — CBC
HCT: 41.2 % (ref 35.0–45.0)
Hemoglobin: 13.5 g/dL (ref 11.7–15.5)
MCH: 30.8 pg (ref 27.0–33.0)
MCHC: 32.8 g/dL (ref 32.0–36.0)
MCV: 94.1 fL (ref 80.0–100.0)
MPV: 10.6 fL (ref 7.5–12.5)
Platelets: 271 10*3/uL (ref 140–400)
RBC: 4.38 10*6/uL (ref 3.80–5.10)
RDW: 11.6 % (ref 11.0–15.0)
WBC: 5.7 10*3/uL (ref 3.8–10.8)

## 2019-11-21 LAB — COMPLETE METABOLIC PANEL WITH GFR
AG Ratio: 1.5 (calc) (ref 1.0–2.5)
ALT: 15 U/L (ref 6–29)
AST: 19 U/L (ref 10–30)
Albumin: 4.6 g/dL (ref 3.6–5.1)
Alkaline phosphatase (APISO): 44 U/L (ref 31–125)
BUN: 9 mg/dL (ref 7–25)
CO2: 23 mmol/L (ref 20–32)
Calcium: 9.4 mg/dL (ref 8.6–10.2)
Chloride: 105 mmol/L (ref 98–110)
Creat: 0.69 mg/dL (ref 0.50–1.10)
GFR, Est African American: 132 mL/min/{1.73_m2} (ref >=60)
GFR, Est Non African American: 114 mL/min/{1.73_m2} (ref >=60)
Globulin: 3.1 g/dL (calc) (ref 1.9–3.7)
Glucose, Bld: 82 mg/dL (ref 65–99)
Potassium: 4.1 mmol/L (ref 3.5–5.3)
Sodium: 138 mmol/L (ref 135–146)
Total Bilirubin: 0.4 mg/dL (ref 0.2–1.2)
Total Protein: 7.7 g/dL (ref 6.1–8.1)

## 2019-11-21 LAB — URINALYSIS, ROUTINE W REFLEX MICROSCOPIC
Bilirubin Urine: NEGATIVE
Glucose, UA: NEGATIVE
Hgb urine dipstick: NEGATIVE
Ketones, ur: NEGATIVE
Leukocytes,Ua: NEGATIVE
Nitrite: NEGATIVE
Protein, ur: NEGATIVE
Specific Gravity, Urine: 1.006 (ref 1.001–1.03)
pH: 7 (ref 5.0–8.0)

## 2019-11-21 LAB — MICROALBUMIN / CREATININE URINE RATIO
Creatinine, Urine: 19 mg/dL — ABNORMAL LOW (ref 20–275)
Microalb Creat Ratio: 37 mcg/mg creat — ABNORMAL HIGH (ref <30)
Microalb, Ur: 0.7 mg/dL

## 2019-11-21 LAB — TSH: TSH: 0.52 mIU/L

## 2019-11-21 LAB — ALDOSTERONE + RENIN ACTIVITY W/ RATIO
ALDO / PRA Ratio: 3 Ratio (ref 0.9–28.9)
Aldosterone: 6 ng/dL
Renin Activity: 1.98 ng/mL/h (ref 0.25–5.82)

## 2019-11-22 ENCOUNTER — Ambulatory Visit (INDEPENDENT_AMBULATORY_CARE_PROVIDER_SITE_OTHER): Admitting: Osteopathic Medicine

## 2019-11-22 ENCOUNTER — Encounter: Payer: Self-pay | Admitting: Osteopathic Medicine

## 2019-11-22 ENCOUNTER — Other Ambulatory Visit: Payer: Self-pay

## 2019-11-22 DIAGNOSIS — I10 Essential (primary) hypertension: Secondary | ICD-10-CM | POA: Diagnosis not present

## 2019-11-22 HISTORY — DX: Essential (primary) hypertension: I10

## 2019-11-22 MED ORDER — OLMESARTAN MEDOXOMIL 40 MG PO TABS: 40.0000 mg | ORAL_TABLET | Freq: Every day | ORAL | 1 refills | Status: DC

## 2019-11-22 MED ORDER — HYDROCHLOROTHIAZIDE 12.5 MG PO TABS: 25.0000 mg | ORAL_TABLET | Freq: Every day | ORAL | 0 refills | Status: DC

## 2019-11-22 MED ORDER — HYDROCHLOROTHIAZIDE 25 MG PO TABS: 25.0000 mg | ORAL_TABLET | Freq: Every day | ORAL | 0 refills | Status: DC

## 2019-11-22 NOTE — Progress Notes (Signed)
Bonnie White is a 34 y.o. female who presents to  [place] at [facility]  today, 11/22/19, seeking care for the following: . Follow-up BP      ASSESSMENT & PLAN with other pertinent history/findings:  Diagnoses of Essential hypertension and Essential (primary) hypertension were pertinent to this visit.  No CP/SOB, no HA/VC/dizzy  BP Readings from Last 3 Encounters:  11/22/19 (!) 180/117 on intake, recheck 170/126, manual recheck was 170/110  11/14/19 (!) 162/103  11/08/19 (!) 156/99       Patient Instructions  Plan:   Medications:   Will increase medications - Hydrochlorothiazide form 12.5 to 25 mg   I suspect we might have to add a second medication - I sent Rx for Olmesartan 40 mg to start in HCTZ 25 mg doesn't help (BP<140/90) within 2-3 days.    Further testing:  Order is in for a CT angiogram to evaluate renal arteries for renal artery stenosis (narrowing of blood vessels to the kidneys) which can cause significantly high blood pressure in otherwise healthy younger people.   If this test is totally normal, we should consider a sleep study to evaluate for sleep apnea, which can also cause high BP.       Orders Placed This Encounter  Procedures  . CT ANGIO ABDOMEN W &/OR WO CONTRAST    Meds ordered this encounter  Medications  . DISCONTD: hydrochlorothiazide (HYDRODIURIL) 12.5 MG tablet    Sig: Take 2 tablets (25 mg total) by mouth daily.    Dispense:  30 tablet    Refill:  0  . olmesartan (BENICAR) 40 MG tablet    Sig: Take 1 tablet (40 mg total) by mouth daily.    Dispense:  30 tablet    Refill:  1  . hydrochlorothiazide (HYDRODIURIL) 25 MG tablet    Sig: Take 1 tablet (25 mg total) by mouth daily.    Dispense:  30 tablet    Refill:  0    CANCEL HCTZ 12.5 MG       Follow-up instructions: Return in about 3 days (around 11/25/2019) for MYCHART REMINDER SET - UPDATE [place] ON BLOOD PRESSURE!  .                                         BP (!) 170/126 (BP Location: Left Arm, Patient Position: Sitting, Cuff Size: Normal)   Pulse 77   Temp 98.1 F (36.7 C) (Oral)   Wt 168 lb 1.3 oz (76.2 kg)   BMI 28.85 kg/m   Current Meds  Medication Sig  . Cyanocobalamin (B-12) 100 MCG TABS Take by mouth.  . hydrochlorothiazide (HYDRODIURIL) 25 MG tablet Take 1 tablet (25 mg total) by mouth daily.  . megestrol (MEGACE) 40 MG tablet Take 1 tablet (40 mg total) by mouth 2 (two) times daily.  . Prenatal Multivit-Min-Fe-FA (PRENATAL VITAMINS PO) Take 1 tablet by mouth daily.   . sertraline (ZOLOFT) 100 MG tablet 100 mg daily all the time OR in 2 weeks from first day of menstrual period increase from 50 mg to 100 mg daily, then on first day of next menstrual period reduce back down to 50 mg daily, and repeat this cycle  . vitamin C (ASCORBIC ACID) 250 MG tablet Take 250 mg by mouth daily.  . [DISCONTINUED] hydrochlorothiazide (HYDRODIURIL) 12.5 MG tablet Take 1 tablet (12.5 mg  total) by mouth daily for 7 days.  . [DISCONTINUED] hydrochlorothiazide (HYDRODIURIL) 12.5 MG tablet Take 2 tablets (25 mg total) by mouth daily.    No results found for this or any previous visit (from the past 72 hour(s)).  No results found.  Depression screen [facility] 2/9 09/01/2019 02/17/2018 08/07/2017  Decreased Interest 2 1 2   Down, Depressed, Hopeless 1 0 1  PHQ - 2 Score 3 1 3   Altered sleeping 3 0 3  Tired, decreased energy 3 0 2  Change in appetite 2 1 1   Feeling bad or failure about yourself  2 1 2   Trouble concentrating 2 1 1   Moving slowly or fidgety/restless 1 0 1  Suicidal thoughts 1 0 0  PHQ-9 Score 17 4 13   Difficult doing work/chores Somewhat difficult Not difficult at all -    GAD 7 : Generalized Anxiety Score 09/01/2019 02/17/2018  Nervous, Anxious, on Edge 3 1  Control/stop worrying 3 1  Worry too much - different things 3 1  Trouble relaxing 2 0  Restless 1 0   Easily annoyed or irritable 3 2  Afraid - awful might happen 2 0  Total GAD 7 Score 17 5  Anxiety Difficulty Somewhat difficult Somewhat difficult      All questions at time of visit were answered - patient instructed to contact office with any additional concerns or updates.  ER/RTC precautions were reviewed with the patient.  Please note: voice recognition software was used to produce this document, and typos may escape review. Please contact Dr. for any needed clarifications.

## 2019-11-22 NOTE — Patient Instructions (Addendum)
Plan:   Medications:   Will increase medications - Hydrochlorothiazide form 12.5 to 25 mg   I suspect we might have to add a second medication - I sent Rx for Olmesartan 40 mg to start in HCTZ 25 mg doesn't help (BP<140/90) within 2-3 days.    Further testing:  Order is in for a CT angiogram to evaluate renal arteries for renal artery stenosis (narrowing of blood vessels to the kidneys) which can cause significantly high blood pressure in otherwise healthy younger people.   If this test is totally normal, we should consider a sleep study to evaluate for sleep apnea, which can also cause high BP.

## 2019-11-23 ENCOUNTER — Ambulatory Visit (INDEPENDENT_AMBULATORY_CARE_PROVIDER_SITE_OTHER)

## 2019-11-23 DIAGNOSIS — I10 Essential (primary) hypertension: Secondary | ICD-10-CM

## 2019-11-23 MED ORDER — IOHEXOL 350 MG/ML SOLN
100.0000 mL | Freq: Once | INTRAVENOUS | Status: AC | PRN
  Administered 2019-11-23: 15:00:00 100 mL via INTRAVENOUS

## 2019-11-24 ENCOUNTER — Telehealth: Payer: Self-pay | Admitting: Osteopathic Medicine

## 2019-11-24 ENCOUNTER — Ambulatory Visit: Admitting: Obstetrics and Gynecology

## 2019-11-24 NOTE — Telephone Encounter (Signed)
Received fax for PA on Olmesartan Medoxomil sent through cover my meds waiting on determination. - CF

## 2019-11-25 MED ORDER — VALSARTAN 160 MG PO TABS: 160.0000 mg | ORAL_TABLET | Freq: Every day | ORAL | 0 refills | Status: DC

## 2019-12-15 ENCOUNTER — Ambulatory Visit (INDEPENDENT_AMBULATORY_CARE_PROVIDER_SITE_OTHER): Admitting: Obstetrics and Gynecology

## 2019-12-15 ENCOUNTER — Other Ambulatory Visit: Payer: Self-pay

## 2019-12-15 ENCOUNTER — Encounter: Payer: Self-pay | Admitting: Obstetrics and Gynecology

## 2019-12-15 VITALS — BP 124/74 | HR 80 | Ht 64.0 in | Wt 171.0 lb

## 2019-12-15 DIAGNOSIS — N939 Abnormal uterine and vaginal bleeding, unspecified: Secondary | ICD-10-CM

## 2019-12-15 NOTE — Progress Notes (Signed)
GYNECOLOGY OFFICE FOLLOW UP NOTE  History:  34 y.o. [id number] here today for follow up for AUB. Has had irregular bleeding for several months. Irregular periods starting getting heavier in December 2020. Starting in January, has period for 14 days, then nothing, then spotting for 7 days, then nothing for 7 days, then 14 days of bleeding. Also having regular cramping. Currently on Megace and it is improving bleeding but not stopping it altogether.  On BP meds for hypertension which is much better controlled than previously. Has had IUD and does not want another one, was uncomfortable for herself and her husband. She has had tubal and is not interested in having more children. Would prefer not to take daily meds if possible, she is not good at remembering.  Desires ablation, has had several friends/relatives who have had it with good results.  Past Medical History:  Diagnosis Date  . Depression   . Essential hypertension 11/22/2019   Home BP monitor verified and reasonably accurate as of 11/22/19.     Past Surgical History:  Procedure Laterality Date  . CESAREAN SECTION N/A 04/22/2017   Procedure: CESAREAN SECTION;  Surgeon: Levie Heritage, DO;  Location: [facility] BIRTHING SUITES;  Service: Obstetrics;  Laterality: N/A;  . HEMATOMA EVACUATION N/A 05/01/2017   Procedure: EVACUATION HEMATOMA;  Surgeon: Adam Phenix, MD;  Location: [facility] ORS;  Service: Gynecology;  Laterality: N/A;  . TUBAL LIGATION    . WISDOM TOOTH EXTRACTION       Current Outpatient Medications:  .  hydrochlorothiazide (HYDRODIURIL) 25 MG tablet, Take 1 tablet (25 mg total) by mouth daily., Disp: 30 tablet, Rfl: 0 .  megestrol (MEGACE) 40 MG tablet, Take 1 tablet (40 mg total) by mouth 2 (two) times daily., Disp: 60 tablet, Rfl: 2 .  sertraline (ZOLOFT) 100 MG tablet, 100 mg daily all the time OR in 2 weeks from first day of menstrual period increase from 50 mg to 100 mg daily, then on first day of next menstrual period reduce  back down to 50 mg daily, and repeat this cycle, Disp: 90 tablet, Rfl: 1 .  valsartan (DIOVAN) 160 MG tablet, Take 1 tablet (160 mg total) by mouth daily., Disp: 90 tablet, Rfl: 0 .  Cyanocobalamin (B-12) 100 MCG TABS, Take by mouth., Disp: , Rfl:  .  Prenatal Multivit-Min-Fe-FA (PRENATAL VITAMINS PO), Take 1 tablet by mouth daily. , Disp: , Rfl:  .  vitamin C (ASCORBIC ACID) 250 MG tablet, Take 250 mg by mouth daily., Disp: , Rfl:   The following portions of the patient's history were reviewed and updated as appropriate: allergies, current medications, past family history, past medical history, past social history, past surgical history and problem list.   Review of Systems:  Pertinent items noted in HPI and remainder of comprehensive ROS otherwise negative.   Objective:  Physical Exam BP 124/74   Pulse 80   Ht 5\' 4"  (1.626 m)   Wt 171 lb (77.6 kg)   LMP 12/11/2019   BMI 29.35 kg/m  CONSTITUTIONAL: Well-developed, well-nourished female in no acute distress.  HENT:  Normocephalic, atraumatic. External right and left ear normal. Oropharynx is clear and moist EYES: Conjunctivae and EOM are normal. Pupils are equal, round, and reactive to light. No scleral icterus.  NECK: Normal range of motion, supple, no masses SKIN: Skin is warm and dry. No rash noted. Not diaphoretic. No erythema. No pallor. NEUROLOGIC: Alert and oriented to person, place, and time. Normal reflexes, muscle tone coordination. No  cranial nerve deficit noted. PSYCHIATRIC: Normal mood and affect. Normal behavior. Normal judgment and thought content. CARDIOVASCULAR: Normal heart rate noted RESPIRATORY: Effort normal, no problems with respiration noted ABDOMEN: Soft, no distention noted.   PELVIC: deferred MUSCULOSKELETAL: Normal range of motion. No edema noted.   Assessment & Plan:   1. Abnormal uterine bleeding (AUB) - With heavy and ongoing periods desiring management - combo OCPs contraindicated due to HTN, has  had IUD and had discomfort - normal [place] - recommended EMB to rule out polyp but otherwise good candidate for ablation - recommend D&C at the time of ablation, would plan for polyp removal (if needed) concurrently with ablation - reviewed with patient that she is a good candidate for ablation, that this may result in decreased periods or no periods, reviewed possibility of post ablation pain, retrograde menstruation, scarring at cervix - reviewed risks of surgery including infection, hemorrhage, damage to surrounding tissue and organs - she verbalizes understanding of these risks and desires to proceed - will schedule for D&C hysteroscopy, novasure ablation - cont megace until then - will have return for pre-op appt   Routine preventative health maintenance measures emphasized. Please refer to After Visit Summary for other counseling recommendations.   No follow-ups on file.  Total face-to-face time with patient: 22 minutes. Over 50% of encounter was spent on counseling and coordination of care.  Feliz Beam, M.D. Attending Center for [employer])

## 2019-12-16 ENCOUNTER — Encounter: Payer: Self-pay | Admitting: Obstetrics and Gynecology

## 2019-12-22 ENCOUNTER — Other Ambulatory Visit: Payer: Self-pay

## 2019-12-22 MED ORDER — HYDROCHLOROTHIAZIDE 25 MG PO TABS: 25.0000 mg | ORAL_TABLET | Freq: Every day | ORAL | 0 refills | Status: DC

## 2019-12-29 NOTE — Telephone Encounter (Signed)
Checked on PA through cover my meds and Olmesartan Medoxomil was denied we have not received anything from insurance to say why. - CF

## 2019-12-30 ENCOUNTER — Other Ambulatory Visit: Payer: Self-pay | Admitting: Osteopathic Medicine

## 2019-12-30 MED ORDER — LOSARTAN POTASSIUM 50 MG PO TABS: 50.0000 mg | ORAL_TABLET | Freq: Every day | ORAL | 1 refills | Status: DC

## 2020-01-17 ENCOUNTER — Encounter (HOSPITAL_BASED_OUTPATIENT_CLINIC_OR_DEPARTMENT_OTHER): Payer: Self-pay | Admitting: Obstetrics and Gynecology

## 2020-01-17 ENCOUNTER — Other Ambulatory Visit: Payer: Self-pay

## 2020-01-19 ENCOUNTER — Encounter: Payer: Self-pay | Admitting: Family Medicine

## 2020-01-19 ENCOUNTER — Other Ambulatory Visit: Payer: Self-pay

## 2020-01-19 ENCOUNTER — Ambulatory Visit (INDEPENDENT_AMBULATORY_CARE_PROVIDER_SITE_OTHER): Admitting: Family Medicine

## 2020-01-19 ENCOUNTER — Other Ambulatory Visit (HOSPITAL_COMMUNITY)
Admission: RE | Admit: 2020-01-19 | Discharge: 2020-01-19 | Disposition: A | Discharge status: Home / Self Care | Source: Ambulatory Visit | Attending: Family Medicine | Admitting: Family Medicine

## 2020-01-19 VITALS — BP 122/63 | HR 80 | Ht 64.0 in | Wt 176.0 lb

## 2020-01-19 DIAGNOSIS — N939 Abnormal uterine and vaginal bleeding, unspecified: Secondary | ICD-10-CM | POA: Diagnosis not present

## 2020-01-19 DIAGNOSIS — Z3202 Encounter for pregnancy test, result negative: Secondary | ICD-10-CM

## 2020-01-19 DIAGNOSIS — Z01812 Encounter for preprocedural laboratory examination: Secondary | ICD-10-CM

## 2020-01-19 LAB — POCT URINE PREGNANCY: Preg Test, Ur: NEGATIVE

## 2020-01-19 NOTE — Progress Notes (Signed)
ENDOMETRIAL BIOPSY     The indications for endometrial biopsy were reviewed.   Risks of the biopsy including cramping, bleeding, infection, uterine perforation, inadequate specimen and need for additional procedures  were discussed. The patient states she understands and agrees to undergo procedure today. Consent was signed. Time out was performed. Urine HCG was negative. A sterile speculum was placed in the patient's vagina and the cervix was prepped with Betadine. A single-toothed tenaculum was placed on the anterior lip of the cervix to stabilize it. The 3 mm pipelle was introduced into the endometrial cavity without difficulty to a depth of 9 cm. There was only blood with the first pass, but a moderate amount of tissue was obtained with the second pass, which was sent to pathology. The instruments were removed from the patient's vagina. Minimal bleeding from the cervix was noted. The patient tolerated the procedure well. Routine post-procedure instructions were given to the patient. The patient will follow up to review the results and for further management.

## 2020-01-20 LAB — SURGICAL PATHOLOGY

## 2020-01-21 ENCOUNTER — Other Ambulatory Visit (HOSPITAL_COMMUNITY)

## 2020-01-23 ENCOUNTER — Other Ambulatory Visit (HOSPITAL_COMMUNITY)
Admission: RE | Admit: 2020-01-23 | Discharge: 2020-01-23 | Disposition: A | Discharge status: Home / Self Care | Source: Ambulatory Visit | Attending: Obstetrics and Gynecology | Admitting: Obstetrics and Gynecology

## 2020-01-23 ENCOUNTER — Encounter (HOSPITAL_BASED_OUTPATIENT_CLINIC_OR_DEPARTMENT_OTHER)
Admission: RE | Admit: 2020-01-23 | Discharge: 2020-01-23 | Disposition: A | Discharge status: Home / Self Care | Source: Ambulatory Visit | Attending: Obstetrics and Gynecology | Admitting: Obstetrics and Gynecology

## 2020-01-23 ENCOUNTER — Ambulatory Visit: Admitting: Family Medicine

## 2020-01-23 DIAGNOSIS — Z20822 Contact with and (suspected) exposure to covid-19: Secondary | ICD-10-CM | POA: Insufficient documentation

## 2020-01-23 DIAGNOSIS — E669 Obesity, unspecified: Secondary | ICD-10-CM | POA: Diagnosis not present

## 2020-01-23 DIAGNOSIS — Z79899 Other long term (current) drug therapy: Secondary | ICD-10-CM | POA: Diagnosis not present

## 2020-01-23 DIAGNOSIS — I10 Essential (primary) hypertension: Secondary | ICD-10-CM | POA: Diagnosis not present

## 2020-01-23 DIAGNOSIS — Z6830 Body mass index (BMI) 30.0-30.9, adult: Secondary | ICD-10-CM | POA: Diagnosis not present

## 2020-01-23 DIAGNOSIS — Z01818 Encounter for other preprocedural examination: Secondary | ICD-10-CM | POA: Insufficient documentation

## 2020-01-23 DIAGNOSIS — Z01812 Encounter for preprocedural laboratory examination: Secondary | ICD-10-CM | POA: Diagnosis present

## 2020-01-23 DIAGNOSIS — F329 Major depressive disorder, single episode, unspecified: Secondary | ICD-10-CM | POA: Diagnosis not present

## 2020-01-23 DIAGNOSIS — N939 Abnormal uterine and vaginal bleeding, unspecified: Secondary | ICD-10-CM | POA: Diagnosis not present

## 2020-01-23 DIAGNOSIS — Z87891 Personal history of nicotine dependence: Secondary | ICD-10-CM | POA: Diagnosis not present

## 2020-01-23 LAB — BASIC METABOLIC PANEL
Anion gap: 9 (ref 5–15)
BUN: 9 mg/dL (ref 6–20)
CO2: 24 mmol/L (ref 22–32)
Calcium: 9 mg/dL (ref 8.9–10.3)
Chloride: 105 mmol/L (ref 98–111)
Creatinine, Ser: 0.68 mg/dL (ref 0.44–1.00)
GFR calc Af Amer: >60 mL/min (ref >60)
GFR calc non Af Amer: >60 mL/min (ref >60)
Glucose, Bld: 94 mg/dL (ref 70–99)
Potassium: 4.5 mmol/L (ref 3.5–5.1)
Sodium: 138 mmol/L (ref 135–145)

## 2020-01-23 LAB — TYPE AND SCREEN
ABO/RH(D): O POS
Antibody Screen: NEGATIVE

## 2020-01-23 LAB — CBC
HCT: 38.4 % (ref 36.0–46.0)
Hemoglobin: 13 g/dL (ref 12.0–15.0)
MCH: 31.6 pg (ref 26.0–34.0)
MCHC: 33.9 g/dL (ref 30.0–36.0)
MCV: 93.4 fL (ref 80.0–100.0)
Platelets: 325 10*3/uL (ref 150–400)
RBC: 4.11 MIL/uL (ref 3.87–5.11)
RDW: 12.5 % (ref 11.5–15.5)
WBC: 5.6 10*3/uL (ref 4.0–10.5)
nRBC: 0 % (ref 0.0–0.2)

## 2020-01-23 LAB — ABO/RH: ABO/RH(D): O POS

## 2020-01-23 LAB — SARS CORONAVIRUS 2 (TAT 6-24 HRS): SARS Coronavirus 2: NEGATIVE

## 2020-01-23 LAB — POCT PREGNANCY, URINE: Preg Test, Ur: NEGATIVE

## 2020-01-23 NOTE — Progress Notes (Signed)

## 2020-01-25 ENCOUNTER — Encounter (HOSPITAL_BASED_OUTPATIENT_CLINIC_OR_DEPARTMENT_OTHER)
Admission: RE | Disposition: A | Discharge status: Home / Self Care | Payer: Self-pay | Source: Home / Self Care | Attending: Obstetrics and Gynecology

## 2020-01-25 ENCOUNTER — Ambulatory Visit (HOSPITAL_BASED_OUTPATIENT_CLINIC_OR_DEPARTMENT_OTHER): Admitting: Anesthesiology

## 2020-01-25 ENCOUNTER — Other Ambulatory Visit: Payer: Self-pay

## 2020-01-25 ENCOUNTER — Ambulatory Visit (HOSPITAL_BASED_OUTPATIENT_CLINIC_OR_DEPARTMENT_OTHER)
Admission: RE | Admit: 2020-01-25 | Discharge: 2020-01-25 | Disposition: A | Discharge status: Home / Self Care | Attending: Obstetrics and Gynecology | Admitting: Obstetrics and Gynecology

## 2020-01-25 ENCOUNTER — Encounter (HOSPITAL_BASED_OUTPATIENT_CLINIC_OR_DEPARTMENT_OTHER): Payer: Self-pay | Admitting: Obstetrics and Gynecology

## 2020-01-25 DIAGNOSIS — E669 Obesity, unspecified: Secondary | ICD-10-CM | POA: Insufficient documentation

## 2020-01-25 DIAGNOSIS — N939 Abnormal uterine and vaginal bleeding, unspecified: Secondary | ICD-10-CM | POA: Insufficient documentation

## 2020-01-25 DIAGNOSIS — Z87891 Personal history of nicotine dependence: Secondary | ICD-10-CM | POA: Insufficient documentation

## 2020-01-25 DIAGNOSIS — F329 Major depressive disorder, single episode, unspecified: Secondary | ICD-10-CM | POA: Insufficient documentation

## 2020-01-25 DIAGNOSIS — Z6830 Body mass index (BMI) 30.0-30.9, adult: Secondary | ICD-10-CM | POA: Insufficient documentation

## 2020-01-25 DIAGNOSIS — Z79899 Other long term (current) drug therapy: Secondary | ICD-10-CM | POA: Insufficient documentation

## 2020-01-25 DIAGNOSIS — I10 Essential (primary) hypertension: Secondary | ICD-10-CM | POA: Insufficient documentation

## 2020-01-25 HISTORY — PX: DILITATION & CURRETTAGE/HYSTROSCOPY WITH NOVASURE ABLATION: SHX5568

## 2020-01-25 SURGERY — DILATATION & CURETTAGE/HYSTEROSCOPY WITH NOVASURE ABLATION
Anesthesia: General | Site: Uterus

## 2020-01-25 MED ORDER — OXYCODONE HCL 5 MG PO TABS
ORAL_TABLET | ORAL | Status: AC
  Filled 2020-01-25: qty 1

## 2020-01-25 MED ORDER — SODIUM CHLORIDE 0.9 % IR SOLN
Status: DC | PRN
  Administered 2020-01-25: 3000 mL

## 2020-01-25 MED ORDER — OXYCODONE HCL 5 MG PO TABS
5.0000 mg | ORAL_TABLET | Freq: Once | ORAL | Status: AC | PRN
  Administered 2020-01-25: 5 mg via ORAL

## 2020-01-25 MED ORDER — PROPOFOL 10 MG/ML IV BOLUS
INTRAVENOUS | Status: AC
  Filled 2020-01-25: qty 20

## 2020-01-25 MED ORDER — BUPIVACAINE HCL (PF) 0.25 % IJ SOLN
INTRAMUSCULAR | Status: AC
  Filled 2020-01-25: qty 30

## 2020-01-25 MED ORDER — KETOROLAC TROMETHAMINE 30 MG/ML IJ SOLN
INTRAMUSCULAR | Status: AC
  Filled 2020-01-25: qty 1

## 2020-01-25 MED ORDER — FENTANYL CITRATE (PF) 100 MCG/2ML IJ SOLN: 25.0000 ug | INTRAMUSCULAR | Status: DC | PRN

## 2020-01-25 MED ORDER — IBUPROFEN 600 MG PO TABS
600.0000 mg | ORAL_TABLET | Freq: Four times a day (QID) | ORAL | 1 refills | Status: DC | PRN
Start: 2020-01-25 — End: 2021-03-19

## 2020-01-25 MED ORDER — MIDAZOLAM HCL 2 MG/2ML IJ SOLN
INTRAMUSCULAR | Status: AC
  Filled 2020-01-25: qty 2

## 2020-01-25 MED ORDER — KETOROLAC TROMETHAMINE 30 MG/ML IJ SOLN
INTRAMUSCULAR | Status: DC | PRN
  Administered 2020-01-25: 30 mg via INTRAVENOUS

## 2020-01-25 MED ORDER — SILVER NITRATE-POT NITRATE 75-25 % EX MISC
CUTANEOUS | Status: AC
  Filled 2020-01-25: qty 10

## 2020-01-25 MED ORDER — POVIDONE-IODINE 10 % EX SWAB
2.0000 "application " | Freq: Once | CUTANEOUS | Status: AC
  Administered 2020-01-25: 2 via TOPICAL

## 2020-01-25 MED ORDER — LIDOCAINE-EPINEPHRINE 1 %-1:100000 IJ SOLN
INTRAMUSCULAR | Status: AC
  Filled 2020-01-25: qty 1

## 2020-01-25 MED ORDER — EPHEDRINE SULFATE-NACL 50-0.9 MG/10ML-% IV SOSY
PREFILLED_SYRINGE | INTRAVENOUS | Status: DC | PRN
  Administered 2020-01-25 (×3): 5 mg via INTRAVENOUS

## 2020-01-25 MED ORDER — OXYCODONE HCL 5 MG/5ML PO SOLN: 5.0000 mg | Freq: Once | ORAL | Status: AC | PRN

## 2020-01-25 MED ORDER — LACTATED RINGERS IV SOLN: INTRAVENOUS | Status: DC

## 2020-01-25 MED ORDER — FENTANYL CITRATE (PF) 100 MCG/2ML IJ SOLN
INTRAMUSCULAR | Status: AC
  Filled 2020-01-25: qty 2

## 2020-01-25 MED ORDER — DEXAMETHASONE SODIUM PHOSPHATE 10 MG/ML IJ SOLN
INTRAMUSCULAR | Status: DC | PRN
Start: 2020-01-25 — End: 2020-01-25
  Administered 2020-01-25: 5 mg via INTRAVENOUS

## 2020-01-25 MED ORDER — DOCUSATE SODIUM 100 MG PO CAPS
100.0000 mg | ORAL_CAPSULE | Freq: Every day | ORAL | 1 refills | Status: DC
Start: 2020-01-25 — End: 2021-05-21

## 2020-01-25 MED ORDER — OXYCODONE-ACETAMINOPHEN 5-325 MG PO TABS: 1.0000 | ORAL_TABLET | ORAL | 0 refills | Status: DC | PRN

## 2020-01-25 MED ORDER — PHENYLEPHRINE 40 MCG/ML (10ML) SYRINGE FOR IV PUSH (FOR BLOOD PRESSURE SUPPORT)
PREFILLED_SYRINGE | INTRAVENOUS | Status: AC
  Filled 2020-01-25: qty 10

## 2020-01-25 MED ORDER — FENTANYL CITRATE (PF) 100 MCG/2ML IJ SOLN
INTRAMUSCULAR | Status: DC | PRN
  Administered 2020-01-25: 25 ug via INTRAVENOUS
  Administered 2020-01-25: 50 ug via INTRAVENOUS
  Administered 2020-01-25: 25 ug via INTRAVENOUS

## 2020-01-25 MED ORDER — PROPOFOL 10 MG/ML IV BOLUS
INTRAVENOUS | Status: DC | PRN
  Administered 2020-01-25: 200 mg via INTRAVENOUS

## 2020-01-25 MED ORDER — PROMETHAZINE HCL 25 MG/ML IJ SOLN: 6.2500 mg | INTRAMUSCULAR | Status: DC | PRN

## 2020-01-25 MED ORDER — ONDANSETRON HCL 4 MG/2ML IJ SOLN
INTRAMUSCULAR | Status: DC | PRN
  Administered 2020-01-25: 4 mg via INTRAVENOUS

## 2020-01-25 MED ORDER — SOD CITRATE-CITRIC ACID 500-334 MG/5ML PO SOLN
30.0000 mL | ORAL | Status: AC
  Administered 2020-01-25: 30 mL via ORAL

## 2020-01-25 MED ORDER — LIDOCAINE HCL (CARDIAC) PF 100 MG/5ML IV SOSY
PREFILLED_SYRINGE | INTRAVENOUS | Status: DC | PRN
  Administered 2020-01-25: 80 mg via INTRAVENOUS

## 2020-01-25 MED ORDER — LIDOCAINE 2% (20 MG/ML) 5 ML SYRINGE
INTRAMUSCULAR | Status: AC
  Filled 2020-01-25: qty 5

## 2020-01-25 MED ORDER — PHENYLEPHRINE 40 MCG/ML (10ML) SYRINGE FOR IV PUSH (FOR BLOOD PRESSURE SUPPORT)
PREFILLED_SYRINGE | INTRAVENOUS | Status: DC | PRN
  Administered 2020-01-25: 80 ug via INTRAVENOUS
  Administered 2020-01-25: 120 ug via INTRAVENOUS

## 2020-01-25 MED ORDER — SOD CITRATE-CITRIC ACID 500-334 MG/5ML PO SOLN
ORAL | Status: AC
  Filled 2020-01-25: qty 30

## 2020-01-25 MED ORDER — MIDAZOLAM HCL 2 MG/2ML IJ SOLN
INTRAMUSCULAR | Status: DC | PRN
  Administered 2020-01-25: 2 mg via INTRAVENOUS

## 2020-01-25 SURGICAL SUPPLY — 14 items
ABLATOR SURESOUND NOVASURE (ABLATOR) ×3 IMPLANT
CATH ROBINSON RED A/P 16FR (CATHETERS) IMPLANT
GLOVE BIOGEL PI IND STRL 6.5 (GLOVE) ×1 IMPLANT
GLOVE BIOGEL PI IND STRL 7.0 (GLOVE) ×2 IMPLANT
GLOVE BIOGEL PI INDICATOR 6.5 (GLOVE) ×2
GLOVE BIOGEL PI INDICATOR 7.0 (GLOVE) ×4
GLOVE ORTHOPEDIC STR SZ6.5 (GLOVE) ×3 IMPLANT
GOWN STRL REUS W/ TWL LRG LVL3 (GOWN DISPOSABLE) ×2 IMPLANT
GOWN STRL REUS W/TWL LRG LVL3 (GOWN DISPOSABLE) ×4
KIT PROCEDURE FLUENT (KITS) ×3 IMPLANT
PACK VAGINAL MINOR WOMEN LF (CUSTOM PROCEDURE TRAY) ×3 IMPLANT
PAD OB MATERNITY 4.3X12.25 (PERSONAL CARE ITEMS) ×3 IMPLANT
PAD PREP 24X48 CUFFED NSTRL (MISCELLANEOUS) ×3 IMPLANT
TOWEL GREEN STERILE FF (TOWEL DISPOSABLE) ×6 IMPLANT

## 2020-01-25 NOTE — Transfer of Care (Signed)
Immediate Anesthesia Transfer of Care Note  Patient: Bonnie White  Procedure(s) Performed: DILATATION & CURETTAGE/HYSTEROSCOPY WITH NOVASURE ABLATION (N/A Uterus)  Patient Location: PACU  Anesthesia Type:General  Level of Consciousness: awake, alert , oriented and patient cooperative  Airway & Oxygen Therapy: Patient Spontanous Breathing and Patient connected to nasal cannula oxygen  Post-op Assessment: Report given to RN and Post -op Vital signs reviewed and stable  Post vital signs: Reviewed and stable  Last Vitals:  Vitals Value Taken Time  BP 123/72   Temp    Pulse 83 01/25/20 0955  Resp 12 01/25/20 0955  SpO2 100 % 01/25/20 0955  Vitals shown include unvalidated device data.  Last Pain:  Vitals:   01/25/20 0751  TempSrc: Oral  PainSc: 2       Patients Stated Pain Goal: 5 (01/25/20 0751)  Complications: No complications documented.

## 2020-01-25 NOTE — Discharge Instructions (Signed)
No ibuprofen until 3:40pm today if needed.    Post Anesthesia Home Care Instructions  Activity: Get plenty of rest for the remainder of the day. A responsible individual must stay with you for 24 hours following the procedure.  For the next 24 hours, DO NOT: -Drive a car -Advertising copywriter -Drink alcoholic beverages -Take any medication unless instructed by your physician -Make any legal decisions or sign important papers.  Meals: Start with liquid foods such as gelatin or soup. Progress to regular foods as tolerated. Avoid greasy, spicy, heavy foods. If nausea and/or vomiting occur, drink only clear liquids until the nausea and/or vomiting subsides. Call your physician if vomiting continues.  Special Instructions/Symptoms: Your throat may feel dry or sore from the anesthesia or the breathing tube placed in your throat during surgery. If this causes discomfort, gargle with warm salt water. The discomfort should disappear within 24 hours.  If you had a scopolamine patch placed behind your ear for the management of post- operative nausea and/or vomiting:  1. The medication in the patch is effective for 72 hours, after which it should be removed.  Wrap patch in a tissue and discard in the trash. Wash hands thoroughly with soap and water. 2. You may remove the patch earlier than 72 hours if you experience unpleasant side effects which may include dry mouth, dizziness or visual disturbances. 3. Avoid touching the patch. Wash your hands with soap and water after contact with the patch.

## 2020-01-25 NOTE — Anesthesia Preprocedure Evaluation (Addendum)
Anesthesia Evaluation  Patient identified by MRN, date of birth, ID band Patient awake    Reviewed: Allergy & Precautions, NPO status , Patient's Chart, lab work & pertinent test results  History of Anesthesia Complications Negative for: history of anesthetic complications  Airway Mallampati: II  TM Distance: >3 FB Neck ROM: Full    Dental  (+) Dental Advisory Given, Teeth Intact   Pulmonary former smoker,    Pulmonary exam normal        Cardiovascular hypertension, Pt. on medications Normal cardiovascular exam     Neuro/Psych PSYCHIATRIC DISORDERS Depression negative neurological ROS     GI/Hepatic negative GI ROS, Neg liver ROS,   Endo/Other   Obesity   Renal/GU negative Renal ROS     Musculoskeletal negative musculoskeletal ROS (+)   Abdominal   Peds  Hematology negative hematology ROS (+)   Anesthesia Other Findings Covid test negative   Reproductive/Obstetrics  S/p tubal ligation                             Anesthesia Physical Anesthesia Plan  ASA: II  Anesthesia Plan: General   Post-op Pain Management:    Induction: Intravenous  PONV Risk Score and Plan: 3 and Treatment may vary due to age or medical condition, Ondansetron, Dexamethasone and Midazolam  Airway Management Planned: LMA  Additional Equipment: None  Intra-op Plan:   Post-operative Plan: Extubation in OR  Informed Consent: I have reviewed the patients History and Physical, chart, labs and discussed the procedure including the risks, benefits and alternatives for the proposed anesthesia with the patient or authorized representative who has indicated his/her understanding and acceptance.     Dental advisory given  Plan Discussed with: CRNA and Anesthesiologist  Anesthesia Plan Comments:        Anesthesia Quick Evaluation

## 2020-01-25 NOTE — Op Note (Addendum)
NovaSure Endometrial Ablation  PREOPERATIVE DIAGNOSIS:  Irregular uterine bleeding POSTOPERATIVE DIAGNOSIS: The same PROCEDURE:  dilation & curettage, hysteroscopy, NovaSure Endometrial Ablation SURGEON:  Ivory Broad. Earlene Plater, MD  INDICATIONS: 34 y.o. [phone]  here for NovaSure Endometrial Ablation.  Risks of surgery were discussed with the patient including but not limited to: bleeding which may require transfusion; infection which may require antibiotics; injury to uterus leading to risk of injury to surrounding intraperitoneal organs, need for additional procedures including laparoscopy or laparotomy, and other postoperative/anesthesia complications. Written informed consent was obtained.   FINDINGS:  A 10 week size uterus.  Diffuse fluffy proliferative endometrium. Right ostia obscured by tissue but otherwise normal appearing, left ostia normal appearing.  ANESTHESIA:   General anesthesia URINE OUTPUT:  unmeasured INTRAVENOUS FLUIDS:  1200 ml of LR FLUID DEFICITS:  195 ml of Lactated Ringers ESTIMATED BLOOD LOSS:  Less than 20 ml SPECIMENS: Endometrial curettings sent to pathology COMPLICATIONS:  None immediate  PROCEDURE DETAILS:  The patient was taken to the operating room where general anesthesia was administered and was found to be adequate.  After an adequate timeout was performed, she was placed in the dorsal lithotomy position and examined; then prepped and draped in the sterile manner.   Her bladder was catheterized for clear yellow urine. A speculum was then placed in the patient's vagina and a single tooth tenaculum was applied to the anterior lip of the cervix.  The sound was used to obtain the cervical and uterine cavity length measurements at 3.0 cm and 10.5 cm respectively; total sounding length 7.5 cm (6.5 cm used on Novasure).  The cervix was dilated manually with Hegar dilators to accommodate the diagnostic hysteroscope.  Once the cervix was dilated, the hysteroscope was inserted  under direct visualization.   The hysteroscope was also used to determine the level of the internal os, and measurements were confirmed. The uterine cavity was carefully examined, both ostia were recognized, and diffusely proliferative endometrium was noted. The hysteroscope was removed and a curettage was done to obtain some endometrial curettings.  The cervix was further dilated to accommodate the NovaSure device.  The NovaSure device was inserted, and a cavity width of 4.7 cm was determined. Using a power of 168 watts, for 66 sec, the endometrial ablation was performed. The hysteroscope was then re-introduced into the uterine cavity, confirming complete ablation of the endometrium. The tenaculum was removed from the anterior lip of the cervix, and the vaginal speculum was removed after noting good hemostasis.  The patient tolerated the procedure well and was taken to the recovery area awake, extubated and in stable condition.  The patient will be discharged to home as per PACU criteria.  Routine postoperative instructions given.  She was prescribed Percocet, Ibuprofen and Colace.  She will follow up in the clinic in 2 weeks for postoperative evaluation.   Baldemar Lenis, M.D. Center for [employer]

## 2020-01-25 NOTE — H&P (Signed)
OB/GYN Pre-Op History and Physical  Bonnie White is a 34 y.o. [phone] presenting for [facility] hysteroscopy with Novasure ablation. Irregular bleeding for several months, has tried IUD but was very uncomfortable with IUD. S/p BTL and desiring management. Not good with remembering pills.      Past Medical History:  Diagnosis Date  . Depression   . Essential hypertension 11/22/2019   Home BP monitor verified and reasonably accurate as of 11/22/19.     Past Surgical History:  Procedure Laterality Date  . CESAREAN SECTION N/A 04/22/2017   Procedure: CESAREAN SECTION;  Surgeon: Levie Heritage, DO;  Location: [facility] BIRTHING SUITES;  Service: Obstetrics;  Laterality: N/A;  . HEMATOMA EVACUATION N/A 05/01/2017   Procedure: EVACUATION HEMATOMA;  Surgeon: Adam Phenix, MD;  Location: [facility];  Service: Gynecology;  Laterality: N/A;  . TUBAL LIGATION    . WISDOM TOOTH EXTRACTION      OB History  Gravida Para Term Preterm AB Living  3 3 3     3   SAB TAB Ectopic Multiple Live Births        0 3    # Outcome Date GA Lbr Len/2nd Weight Sex Delivery Anes PTL Lv  3 Term 04/22/17 [redacted]w[redacted]d  3450 g F CS-LTranv Gen  LIV  2 Term 05/21/15 [redacted]w[redacted]d 06:50 / 00:14 4045 g M Vag-Spont EPI  LIV     Birth Comments: none  1 Term 06/26/08    F Vag-Spont  Y LIV    Social History   Socioeconomic History  . Marital status: Married    Spouse name: Not on file  . Number of children: Not on file  . Years of education: Not on file  . Highest education level: Not on file  Occupational History  . Not on file  Tobacco Use  . Smoking status: Former Smoker    Packs/day: 1.00    Years: 8.00    Pack years: 8.00    Types: Cigarettes  . Smokeless tobacco: Never Used  Vaping Use  . Vaping Use: Never used  Substance and Sexual Activity  . Alcohol use: No    Alcohol/week: 0.0 standard drinks    Comment: rarely, maybe 2 per week   . Drug use: No  . Sexual activity: Yes    Partners: Male    Birth  control/protection: Surgical  Other Topics Concern  . Not on file  Social History Narrative  . Not on file   Social Determinants of Health   Financial Resource Strain:   . Difficulty of Paying Living Expenses:   Food Insecurity:   . Worried About 06/28/08 in the Last Year:   . Programme researcher, broadcasting/film/video in the Last Year:   Transportation Needs:   . Barista (Medical):   Freight forwarder Lack of Transportation (Non-Medical):   Physical Activity:   . Days of Exercise per Week:   . Minutes of Exercise per Session:   Stress:   . Feeling of Stress :   Social Connections:   . Frequency of Communication with Friends and Family:   . Frequency of Social Gatherings with Friends and Family:   . Attends Religious Services:   . Active Member of Clubs or Organizations:   . Attends [employer] Meetings:   Banker Marital Status:     Family History  Problem Relation Age of Onset  . Ulcerative colitis Father   . Seizures Sister   . Breast cancer Paternal Aunt  mid-40's age of diagnosis  . Crohn's disease Paternal Grandmother     Medications Prior to Admission  Medication Sig Dispense Refill Last Dose  . Cyanocobalamin (B-12) 100 MCG TABS Take by mouth.   Past Week at Unknown time  . hydrochlorothiazide (HYDRODIURIL) 25 MG tablet Take 1 tablet (25 mg total) by mouth daily. 90 tablet 0 01/24/2020 at Unknown time  . megestrol (MEGACE) 40 MG tablet Take 1 tablet (40 mg total) by mouth 2 (two) times daily. 60 tablet 2 01/24/2020 at Unknown time  . Prenatal Multivit-Min-Fe-FA (PRENATAL VITAMINS PO) Take 1 tablet by mouth daily.    Past Week at Unknown time  . sertraline (ZOLOFT) 100 MG tablet 100 mg daily all the time OR in 2 weeks from first day of menstrual period increase from 50 mg to 100 mg daily, then on first day of next menstrual period reduce back down to 50 mg daily, and repeat this cycle 90 tablet 1 01/24/2020 at Unknown time  . valsartan (DIOVAN) 160 MG tablet Take 160 mg by  mouth daily.   01/24/2020 at Unknown time  . vitamin C (ASCORBIC ACID) 250 MG tablet Take 250 mg by mouth daily.   Past Week at Unknown time    No Known Allergies  Review of Systems: Negative except for what is mentioned in HPI.     Physical Exam: BP 108/65   Pulse 87   Temp 98.3 F (36.8 C) (Oral)   Resp 16   Ht 5\' 4"  (1.626 m)   Wt 79.9 kg   SpO2 99%   BMI 30.24 kg/m  CONSTITUTIONAL: Well-developed, well-nourished female in no acute distress.  HENT:  Normocephalic, atraumatic, External right and left ear normal. Oropharynx is clear and moist EYES: Conjunctivae and EOM are normal. Pupils are equal, round, and reactive to light. No scleral icterus.  NECK: Normal range of motion, supple, no masses SKIN: Skin is warm and dry. No rash noted. Not diaphoretic. No erythema. No pallor. NEUROLGIC: Alert and oriented to person, place, and time. Normal reflexes, muscle tone coordination. No cranial nerve deficit noted. PSYCHIATRIC: Normal mood and affect. Normal behavior. Normal judgment and thought content. CARDIOVASCULAR: Normal heart rate noted RESPIRATORY: Effort normal, no problems with respiration noted ABDOMEN: Soft, nontender, nondistended, gravid. well-healed Pfannenstiel incision. PELVIC: Deferred MUSCULOSKELETAL: Normal range of motion. No edema and no tenderness. 2+ distal pulses.   Pertinent Labs/Studies:   Results for orders placed or performed during the hospital encounter of 01/25/20 (from the past 72 hour(s))  Type and screen [facility]     Status: None   Collection Time: 01/23/20 12:00 PM  Result Value Ref Range   ABO/RH(D) O POS    Antibody Screen NEG    Sample Expiration      01/26/2020,2359 Performed at [facility], [address] [place], [place]   ABO/Rh     Status: None   Collection Time: 01/23/20 12:00 PM  Result Value Ref Range   ABO/RH(D)      O POS Performed at [facility], [address] [place], [place]  [place]   CBC     Status: None   Collection Time: 01/23/20  3:11 PM  Result Value Ref Range   WBC 5.6 4.0 - 10.5 K/uL   RBC 4.11 3.87 - 5.11 MIL/uL   Hemoglobin 13.0 12.0 - 15.0 g/dL   HCT 01/25/20 36 - 46 %   MCV 93.4 80.0 - 100.0 fL  MCH 31.6 26.0 - 34.0 pg   MCHC 33.9 30.0 - 36.0 g/dL   RDW 12.5 11.5 - 15.5 %   Platelets 325 150 - 400 K/uL   nRBC 0.0 0.0 - 0.2 %    Comment: Performed at [facility], [place] [address], [place], [place] [address]  Basic metabolic panel     Status: None   Collection Time: 01/23/20  3:11 PM  Result Value Ref Range   Sodium 138 135 - 145 mmol/L   Potassium 4.5 3.5 - 5.1 mmol/L   Chloride 105 98 - 111 mmol/L   CO2 24 22 - 32 mmol/L   Glucose, Bld 94 70 - 99 mg/dL    Comment: Glucose reference range applies only to samples taken after fasting for at least 8 hours.   BUN 9 6 - 20 mg/dL   Creatinine, Ser 0.68 0.44 - 1.00 mg/dL   Calcium 9.0 8.9 - 10.3 mg/dL   GFR calc non Af Amer >60 >60 mL/min   GFR calc Af Amer >60 >60 mL/min   Anion gap 9 5 - 15    Comment: Performed at [facility] [address] [place], [place] [address]   [place] 11/2019 CLINICAL DATA:  Abnormal uterine bleeding for 3-4 months  EXAM: TRANSABDOMINAL ULTRASOUND OF PELVIS  TECHNIQUE: Transabdominal ultrasound examination of the pelvis was performed including evaluation of the uterus, ovaries, adnexal regions, and pelvic cul-de-sac. Transvaginal imaging was not ordered.  COMPARISON:  None  FINDINGS: Uterus  Measurements: 11.4 x 5.3 x 6.5 cm = volume: 205 mL. Anteverted. Normal morphology without mass  Endometrium  Thickness: 5 mm. Tiny amount of endometrial fluid. Small hyperechoic foci are seen at the basal layers of the endometrium at the upper uterine segment, question tiny calcifications. No focal mass.  Right ovary  Measurements: 2.9 x 2.5 x 2.5 cm = volume: 9 mL. Dominant follicle without mass  Left ovary  Measurements: 3.6 x 1.4 x 2.2  cm = volume: 6 mL. Normal morphology without mass  Other findings:  No free pelvic fluid or adnexal masses.  IMPRESSION: Tiny amount of nonspecific endometrial fluid.  Otherwise negative exam  Electronically Signed   By: Lavonia Dana M.D.   On: 11/09/2019 15:47  EMB: 6/21 SURGICAL PATHOLOGY  CASE: [id number]  PATIENT: Wallace Going  Surgical Pathology Report   Clinical History: AUB, patient is having surgery on June 23 and needs  results before them (cm)   FINAL MICROSCOPIC DIAGNOSIS:   A. ENDOMETRIUM, BIOPSY:  - Benign inactive endometrium with hormone-effect  - Negative for hyperplasia or malignancy     Assessment and Plan :Bonnie White is a 34 y.o. [id number] here for D&C hysteroscopy, Novasure endometrial ablation. Patient has been counseled regarding endometrial ablation, and she is interested in this. Reviewed risks/benefits of endometrial ablation including infection, hemorrhage, damage to surrounding tissue and organs, possibility of not ablating all tissue and that she may continue to have periods, possibility of stenotic cervix causing difficulty with future endometrial sampling, possibility of increased pain, particularly if not all tissue is ablated and cervix is stenotic. Reviewed she may need further management if ablation fails. I reviewed that she will not be able to achieve pregnancy after endometrial ablation is done however if she ever feels she may be pregnant, to take a pregnancy test. She verbalizes understanding of the above, consent signed. She is agreeable to blood transfusion in the event of emergency.    Plan for [facility] hysteroscopy, Novasure  ablation NPO Admission labs ordered VS Q4   K. Therese Sarah, M.D. Attending Obstetrician & Gynecologist, [facility] for [employer], [facility]

## 2020-01-25 NOTE — Anesthesia Procedure Notes (Signed)
Procedure Name: LMA Insertion Date/Time: 01/25/2020 9:04 AM Performed by: Yolonda Kida, CRNA Pre-anesthesia Checklist: Patient identified, Emergency Drugs available, Suction available and Patient being monitored Patient Re-evaluated:Patient Re-evaluated prior to induction Oxygen Delivery Method: Circle system utilized Preoxygenation: Pre-oxygenation with 100% oxygen Induction Type: IV induction LMA: LMA inserted LMA Size: 4.0 Number of attempts: 1 Placement Confirmation: positive ETCO2 and breath sounds checked- equal and bilateral Tube secured with: Tape Dental Injury: Teeth and Oropharynx as per pre-operative assessment

## 2020-01-25 NOTE — Anesthesia Postprocedure Evaluation (Signed)
Anesthesia Post Note  Patient: Bonnie White  Procedure(s) Performed: DILATATION & CURETTAGE/HYSTEROSCOPY WITH NOVASURE ABLATION (N/A Uterus)     Patient location during evaluation: PACU Anesthesia Type: General Level of consciousness: sedated and patient cooperative Pain management: pain level controlled Vital Signs Assessment: post-procedure vital signs reviewed and stable Respiratory status: spontaneous breathing Cardiovascular status: stable Anesthetic complications: no   No complications documented.  Last Vitals:  Vitals:   01/25/20 1000 01/25/20 1038  BP: 115/67 121/67  Pulse: 69 67  Resp: 10 18  Temp:  37.2 C  SpO2: 100% 100%    Last Pain:  Vitals:   01/25/20 1038  TempSrc:   PainSc: 6                  Lewie Loron

## 2020-01-26 ENCOUNTER — Encounter (HOSPITAL_BASED_OUTPATIENT_CLINIC_OR_DEPARTMENT_OTHER): Payer: Self-pay | Admitting: Obstetrics and Gynecology

## 2020-01-26 LAB — SURGICAL PATHOLOGY

## 2020-02-20 ENCOUNTER — Other Ambulatory Visit (HOSPITAL_COMMUNITY)
Admission: RE | Admit: 2020-02-20 | Discharge: 2020-02-20 | Disposition: A | Discharge status: Home / Self Care | Source: Ambulatory Visit | Attending: Obstetrics and Gynecology | Admitting: Obstetrics and Gynecology

## 2020-02-20 ENCOUNTER — Other Ambulatory Visit: Payer: Self-pay

## 2020-02-20 ENCOUNTER — Ambulatory Visit (INDEPENDENT_AMBULATORY_CARE_PROVIDER_SITE_OTHER): Admitting: Obstetrics and Gynecology

## 2020-02-20 ENCOUNTER — Encounter: Payer: Self-pay | Admitting: Obstetrics and Gynecology

## 2020-02-20 VITALS — BP 119/81 | HR 64 | Resp 16 | Ht 64.0 in | Wt 183.0 lb

## 2020-02-20 DIAGNOSIS — R32 Unspecified urinary incontinence: Secondary | ICD-10-CM

## 2020-02-20 DIAGNOSIS — N898 Other specified noninflammatory disorders of vagina: Secondary | ICD-10-CM

## 2020-02-20 DIAGNOSIS — Z09 Encounter for follow-up examination after completed treatment for conditions other than malignant neoplasm: Secondary | ICD-10-CM | POA: Diagnosis not present

## 2020-02-20 DIAGNOSIS — Z9889 Other specified postprocedural states: Secondary | ICD-10-CM

## 2020-02-20 NOTE — Progress Notes (Signed)
GYNECOLOGY OFFICE FOLLOW UP NOTE  History:  34 y.o. [id number] here today for follow up for D&C hysteroscopy and endometrial ablation on 01/25/20. Doing well, had cramping for 2 days that resolved. Never really had bleeding after ablation. Does report some urinary leaking and vaginal discharge that is new. Not significant amounts of urinary leakage but is "annoying".     Past Medical History:  Diagnosis Date  . Depression   . Essential hypertension 11/22/2019   Home BP monitor verified and reasonably accurate as of 11/22/19.     Past Surgical History:  Procedure Laterality Date  . CESAREAN SECTION N/A 04/22/2017   Procedure: CESAREAN SECTION;  Surgeon: Levie Heritage, DO;  Location: [facility] BIRTHING SUITES;  Service: Obstetrics;  Laterality: N/A;  . DILITATION & CURRETTAGE/HYSTROSCOPY WITH NOVASURE ABLATION N/A 01/25/2020   Procedure: DILATATION & CURETTAGE/HYSTEROSCOPY WITH NOVASURE ABLATION;  Surgeon: Conan Bowens, MD;  Location: [facility];  Service: Gynecology;  Laterality: N/A;  . HEMATOMA EVACUATION N/A 05/01/2017   Procedure: EVACUATION HEMATOMA;  Surgeon: Adam Phenix, MD;  Location: [facility];  Service: Gynecology;  Laterality: N/A;  . TUBAL LIGATION    . WISDOM TOOTH EXTRACTION       Current Outpatient Medications:  .  Cyanocobalamin (B-12) 100 MCG TABS, Take by mouth., Disp: , Rfl:  .  docusate sodium (COLACE) 100 MG capsule, Take 1 capsule (100 mg total) by mouth daily., Disp: 30 capsule, Rfl: 1 .  sertraline (ZOLOFT) 100 MG tablet, 100 mg daily all the time OR in 2 weeks from first day of menstrual period increase from 50 mg to 100 mg daily, then on first day of next menstrual period reduce back down to 50 mg daily, and repeat this cycle, Disp: 90 tablet, Rfl: 1 .  vitamin C (ASCORBIC ACID) 250 MG tablet, Take 250 mg by mouth daily., Disp: , Rfl:  .  hydrochlorothiazide (HYDRODIURIL) 25 MG tablet, Take 1 tablet (25 mg total) by mouth daily. (Patient not taking:  Reported on 02/20/2020), Disp: 90 tablet, Rfl: 0 .  ibuprofen (ADVIL) 600 MG tablet, Take 1 tablet (600 mg total) by mouth every 6 (six) hours as needed. (Patient not taking: Reported on 02/20/2020), Disp: 30 tablet, Rfl: 1 .  valsartan (DIOVAN) 160 MG tablet, Take 160 mg by mouth daily. (Patient not taking: Reported on 02/20/2020), Disp: , Rfl:   The following portions of the patient's history were reviewed and updated as appropriate: allergies, current medications, past family history, past medical history, past social history, past surgical history and problem list.   Review of Systems:  Pertinent items noted in HPI and remainder of comprehensive ROS otherwise negative.   Objective:  Physical Exam BP 119/81   Pulse 64   Resp 16   Ht 5\' 4"  (1.626 m)   Wt 183 lb (83 kg)   BMI 31.41 kg/m  CONSTITUTIONAL: Well-developed, well-nourished female in no acute distress.  HENT:  Normocephalic, atraumatic. External right and left ear normal. Oropharynx is clear and moist EYES: Conjunctivae and EOM are normal. Pupils are equal, round, and reactive to light. No scleral icterus.  NECK: Normal range of motion, supple, no masses SKIN: Skin is warm and dry. No rash noted. Not diaphoretic. No erythema. No pallor. NEUROLOGIC: Alert and oriented to person, place, and time. Normal reflexes, muscle tone coordination. No cranial nerve deficit noted. PSYCHIATRIC: Normal mood and affect. Normal behavior. Normal judgment and thought content. CARDIOVASCULAR: Normal heart rate noted RESPIRATORY: Effort normal, no problems  with respiration noted ABDOMEN: Soft, no distention noted.   PELVIC: Normal appearing external genitalia; normal appearing vaginal mucosa and cervix.  Watery discharge discharge noted.  pelvic cultures obtained.  MUSCULOSKELETAL: Normal range of motion. No edema noted.  Exam done with chaperone present.  Labs and Imaging No results found.  Assessment & Plan:  1. Urinary incontinence,  unspecified type Rule out UTI - watery discharge in vagina, no obvious fistula and unlikely from ablation but if continues, will rule out - Urine Culture  2. Post-operative state Reviewed benign path  3. Vaginal discharge - Cervicovaginal ancillary only( [place])   Routine preventative health maintenance measures emphasized. Please refer to After Visit Summary for other counseling recommendations.   Return in about 4 weeks (around 03/19/2020) for Followup.  Total face-to-face time with patient: 14 minutes. Over 50% of encounter was spent on counseling and coordination of care.  Baldemar Lenis, M.D. Attending Center for [employer] Midwife)

## 2020-02-21 LAB — CERVICOVAGINAL ANCILLARY ONLY
Bacterial Vaginitis (gardnerella): NEGATIVE
Candida Glabrata: NEGATIVE
Candida Vaginitis: NEGATIVE
Chlamydia: NEGATIVE
Comment: NEGATIVE
Comment: NEGATIVE
Comment: NEGATIVE
Comment: NEGATIVE
Comment: NEGATIVE
Comment: NORMAL
Neisseria Gonorrhea: NEGATIVE
Trichomonas: NEGATIVE

## 2020-02-22 LAB — URINE CULTURE
MICRO NUMBER:: 10722927
Result:: NO GROWTH
SPECIMEN QUALITY:: ADEQUATE

## 2020-03-15 ENCOUNTER — Ambulatory Visit: Admitting: Obstetrics and Gynecology

## 2020-04-13 ENCOUNTER — Emergency Department: Admit: 2020-04-13 | Discharge status: Absent without leave | Payer: Self-pay

## 2020-04-27 ENCOUNTER — Other Ambulatory Visit: Payer: Self-pay | Admitting: Osteopathic Medicine

## 2020-04-27 DIAGNOSIS — F53 Postpartum depression: Secondary | ICD-10-CM

## 2020-10-03 ENCOUNTER — Ambulatory Visit: Admitting: Osteopathic Medicine

## 2021-03-18 ENCOUNTER — Telehealth: Payer: Self-pay | Admitting: General Practice

## 2021-03-18 NOTE — Telephone Encounter (Signed)
Transition Care Management Unsuccessful Follow-up Telephone Call  Date of discharge and from where:  03/15/21 from [facility]  Attempts:  1st Attempt  Reason for unsuccessful TCM follow-up call:  Left voice message

## 2021-03-19 ENCOUNTER — Ambulatory Visit (INDEPENDENT_AMBULATORY_CARE_PROVIDER_SITE_OTHER): Admitting: Osteopathic Medicine

## 2021-03-19 ENCOUNTER — Other Ambulatory Visit: Payer: Self-pay

## 2021-03-19 ENCOUNTER — Encounter: Payer: Self-pay | Admitting: Osteopathic Medicine

## 2021-03-19 VITALS — BP 135/87 | HR 83 | Temp 98.2°F | Wt 190.1 lb

## 2021-03-19 DIAGNOSIS — O99345 Other mental disorders complicating the puerperium: Secondary | ICD-10-CM | POA: Diagnosis not present

## 2021-03-19 DIAGNOSIS — I10 Essential (primary) hypertension: Secondary | ICD-10-CM | POA: Diagnosis not present

## 2021-03-19 DIAGNOSIS — F53 Postpartum depression: Secondary | ICD-10-CM | POA: Diagnosis not present

## 2021-03-19 MED ORDER — OLMESARTAN MEDOXOMIL 20 MG PO TABS: 20.0000 mg | ORAL_TABLET | Freq: Every day | ORAL | 0 refills | Status: DC

## 2021-03-19 MED ORDER — SERTRALINE HCL 100 MG PO TABS: ORAL_TABLET | ORAL | 0 refills | Status: DC

## 2021-03-19 MED ORDER — HYDROCHLOROTHIAZIDE 25 MG PO TABS: 25.0000 mg | ORAL_TABLET | Freq: Every day | ORAL | 0 refills | Status: DC

## 2021-03-19 NOTE — Patient Instructions (Addendum)
Refilling HCTZ Starting Olmesartan 20 mg Refilled Zoloft Referral to Cardiology to discuss BP Order sleep study  Follow up here depending on cardiology

## 2021-03-19 NOTE — Progress Notes (Signed)
Bonnie White is a 35 y.o. female who presents to  [facility] at [facility]  today, 03/19/21, seeking care for the following:  HTN -longstanding history of significantly high blood pressure in the absence of an obvious etiology.  CTA was negative for renal artery pathology.  We had ordered sleep study but patient was never set up with this test.  No history of gestational high blood pressure or preeclampsia.  She reports occasionally will have bad headaches, checks her blood pressure and it is high with systolic up into the high 100s/low 200s.  No vision change or chest pain when this occurs.  She denies palpitations.  She does report a "odd feeling" in the chest but would not describe it as exertional chest pressure.  Denies fever, palpitations, syncope/LOC/presyncope, flushing, lightheadedness   BP Readings from Last 3 Encounters:  03/19/21 135/87  02/20/20 119/81  01/25/20 121/67     ASSESSMENT & PLAN with other pertinent findings:  The primary encounter diagnosis was Essential hypertension. A diagnosis of Postpartum depression was also pertinent to this visit.   Seems like it would be atypical for adrenal pathology but depending on response to medication/cardiology recommendations would certainly consider urine metanephrines, will refill HCTZ and start patient on olmesartan, she states valsartan was not really effective so she stopped taking this.  Given history of depression, would avoid beta-blocker unless we had to use this there does not seem to be a compelling reason to do beta-blocker in the absence of palpitations at this time   Patient Instructions  Refilling HCTZ Starting Olmesartan 20 mg Refilled Zoloft Referral to Cardiology to discuss BP Order sleep study  Follow up here depending on cardiology   Orders Placed This Encounter  Procedures   Ambulatory referral to Cardiology   Home sleep test    Meds ordered this  encounter  Medications   sertraline (ZOLOFT) 100 MG tablet    Sig: INCREASED DOSE 1 TABLET DAILY 2 WEEKS BEFORE ANTICIPATED PERIOD, DECREASE TO 1/2 TABLET DAILY ON 1ST DAY OF PERIOD.    Dispense:  30 tablet    Refill:  0   hydrochlorothiazide (HYDRODIURIL) 25 MG tablet    Sig: Take 1 tablet (25 mg total) by mouth daily.    Dispense:  90 tablet    Refill:  0   olmesartan (BENICAR) 20 MG tablet    Sig: Take 1 tablet (20 mg total) by mouth daily.    Dispense:  90 tablet    Refill:  0     See below for relevant physical exam findings  See below for recent lab and imaging results reviewed  Medications, allergies, PMH, PSH, SocH, FamH reviewed below    Follow-up instructions: Return for 2 weeks BP follow-up (can cancel w/ me if able to see cardiology sooner) .                                        Exam:  BP 135/87 (BP Location: Left Arm, Patient Position: Sitting, Cuff Size: Normal)   Pulse 83   Temp 98.2 F (36.8 C) (Oral)   Wt 190 lb 1.9 oz (86.2 kg)   BMI 32.63 kg/m  Constitutional: VS see above. General Appearance: alert, well-developed, well-nourished, NAD Neck: No masses, trachea midline.  Respiratory: Normal respiratory effort. no wheeze, no rhonchi, no rales Cardiovascular: S1/S2 normal, no murmur, no rub/gallop  auscultated. RRR.  Musculoskeletal: Gait normal. Symmetric and independent movement of all extremitie Neurological: Normal balance/coordination. No tremor. Skin: warm, dry, intact.  Psychiatric: Normal judgment/insight. Normal mood and affect. Oriented x3.   Current Meds  Medication Sig   Cyanocobalamin (B-12) 100 MCG TABS Take by mouth.   docusate sodium (COLACE) 100 MG capsule Take 1 capsule (100 mg total) by mouth daily.   olmesartan (BENICAR) 20 MG tablet Take 1 tablet (20 mg total) by mouth daily.   vitamin C (ASCORBIC ACID) 250 MG tablet Take 250 mg by mouth daily.   [DISCONTINUED] hydrochlorothiazide (HYDRODIURIL)  25 MG tablet Take 1 tablet (25 mg total) by mouth daily.   [DISCONTINUED] sertraline (ZOLOFT) 100 MG tablet TAKE 1 DAILY OR 1/2 DAILY, INCREASE TO 1 DAILY 2 WEEKS BEFORE PERIOD, DECREASE TO 1/2 DAILY ON 1ST DAY OF PERIOD.    No Known Allergies  Patient Active Problem List   Diagnosis Date Noted   Essential hypertension 11/22/2019   Abnormal uterine bleeding (AUB) 11/08/2019   Psychophysiological insomnia 08/08/2017   Postpartum depression 07/07/2017   Abdominal wall hematoma 04/30/2017   Status post tubal ligation 04/24/2017   Status post primary low transverse cesarean section 04/24/2017    Family History  Problem Relation Age of Onset   Ulcerative colitis Father    Seizures Sister    Breast cancer Paternal Aunt        mid-40's age of diagnosis   Crohn's disease Paternal Grandmother     Social History   Tobacco Use  Smoking Status Former   Packs/day: 1.00   Years: 8.00   Pack years: 8.00   Types: Cigarettes  Smokeless Tobacco Never    Past Surgical History:  Procedure Laterality Date   CESAREAN SECTION N/A 04/22/2017   Procedure: CESAREAN SECTION;  Surgeon: Levie Heritage, DO;  Location: [facility];  Service: Obstetrics;  Laterality: N/A;   DILITATION & CURRETTAGE/HYSTROSCOPY WITH NOVASURE ABLATION N/A 01/25/2020   Procedure: DILATATION & CURETTAGE/HYSTEROSCOPY WITH NOVASURE ABLATION;  Surgeon: Conan Bowens, MD;  Location: [facility];  Service: Gynecology;  Laterality: N/A;   HEMATOMA EVACUATION N/A 05/01/2017   Procedure: EVACUATION HEMATOMA;  Surgeon: Adam Phenix, MD;  Location: [facility];  Service: Gynecology;  Laterality: N/A;   TUBAL LIGATION     WISDOM TOOTH EXTRACTION      Immunization History  Administered Date(s) Administered   Influenza,inj,Quad PF,6+ Mos 09/25/2014, 05/22/2015, 04/24/2017   Influenza-Unspecified 07/04/2019   Tdap 03/10/2013, 02/12/2015, 02/18/2017    No results found for this or any previous visit (from the  past 2160 hour(s)).  No results found.     All questions at time of visit were answered - patient instructed to contact office with any additional concerns or updates. ER/RTC precautions were reviewed with the patient as applicable.   Please note: manual typing as well as voice recognition software may have been used to produce this document - typos may escape review. Please contact Dr. Lyn Hollingshead for any needed clarifications.

## 2021-03-20 ENCOUNTER — Inpatient Hospital Stay: Admitting: Osteopathic Medicine

## 2021-03-20 NOTE — Telephone Encounter (Signed)
Transition Care Management Follow-up Telephone Call Date of discharge and from where: 03/15/21 from [facility] How have you been since you were released from the hospital? Patient had OV with PCP on 03/19/21. Any questions or concerns? No

## 2021-04-03 ENCOUNTER — Encounter: Payer: Self-pay | Admitting: Osteopathic Medicine

## 2021-04-03 ENCOUNTER — Other Ambulatory Visit: Payer: Self-pay

## 2021-04-03 ENCOUNTER — Ambulatory Visit (INDEPENDENT_AMBULATORY_CARE_PROVIDER_SITE_OTHER): Admitting: Osteopathic Medicine

## 2021-04-03 VITALS — BP 144/95 | HR 66 | Temp 98.8°F | Wt 190.1 lb

## 2021-04-03 DIAGNOSIS — I10 Essential (primary) hypertension: Secondary | ICD-10-CM

## 2021-04-03 MED ORDER — LISINOPRIL 20 MG PO TABS
20.0000 mg | ORAL_TABLET | Freq: Every day | ORAL | 1 refills | Status: DC
Start: 2021-04-03 — End: 2021-04-18

## 2021-04-03 NOTE — Progress Notes (Signed)
Bonnie White is a 35 y.o. female who presents to  [facility] at [facility]  today, 04/03/21, seeking care for the following:  BP follow-up, was unable to start olmesartan d/t high cost w/ insurance. No CP/SOB, no HA/VC, has appt w/ cardiology in 05/2021. Has not heard back about sleep study to eval OSA.      ASSESSMENT & PLAN with other pertinent findings:  The encounter diagnosis was Essential hypertension.   BP Readings from Last 3 Encounters:  04/03/21 (!) 144/95  03/19/21 135/87  02/20/20 119/81    Patient Instructions  Continue HCTZ 25 mg Start Lisinopril 20 mg  Ask cardiology if another Dr/office can get you seen sooner  Will try to figure out what the deal is with the sleep study   Orders Placed This Encounter  Procedures   Home sleep test    Meds ordered this encounter  Medications   lisinopril (ZESTRIL) 20 MG tablet    Sig: Take 1 tablet (20 mg total) by mouth daily.    Dispense:  30 tablet    Refill:  1     See below for relevant physical exam findings  See below for recent lab and imaging results reviewed  Medications, allergies, PMH, PSH, SocH, FamH reviewed below    Follow-up instructions: Return in about 2 weeks (around 04/17/2021) for recheck BP (can canel if get in to caradiology sooner than that) .                                        Exam:  BP (!) 144/95 (BP Location: Left Arm, Patient Position: Sitting, Cuff Size: Normal)   Pulse 66   Temp 98.8 F (37.1 C) (Oral)   Wt 190 lb 1.9 oz (86.2 kg)   BMI 32.63 kg/m  Constitutional: VS see above. General Appearance: alert, well-developed, well-nourished, NAD Neck: No masses, trachea midline.  Respiratory: Normal respiratory effort. no wheeze, no rhonchi, no rales Cardiovascular: S1/S2 normal, no murmur, no rub/gallop auscultated. RRR.  Musculoskeletal: Gait normal.  Neurological: Normal balance/coordination.  No tremor. Skin: warm, dry, intact.  Psychiatric: Normal judgment/insight. Normal mood and affect. Oriented x3.   Current Meds  Medication Sig   Cyanocobalamin (B-12) 100 MCG TABS Take by mouth.   docusate sodium (COLACE) 100 MG capsule Take 1 capsule (100 mg total) by mouth daily.   hydrochlorothiazide (HYDRODIURIL) 25 MG tablet Take 1 tablet (25 mg total) by mouth daily.   lisinopril (ZESTRIL) 20 MG tablet Take 1 tablet (20 mg total) by mouth daily.   sertraline (ZOLOFT) 100 MG tablet INCREASED DOSE 1 TABLET DAILY 2 WEEKS BEFORE ANTICIPATED PERIOD, DECREASE TO 1/2 TABLET DAILY ON 1ST DAY OF PERIOD.   vitamin C (ASCORBIC ACID) 250 MG tablet Take 250 mg by mouth daily.    No Known Allergies  Patient Active Problem List   Diagnosis Date Noted   Essential hypertension 11/22/2019   Abnormal uterine bleeding (AUB) 11/08/2019   Psychophysiological insomnia 08/08/2017   Postpartum depression 07/07/2017   Abdominal wall hematoma 04/30/2017   Status post tubal ligation 04/24/2017   Status post primary low transverse cesarean section 04/24/2017    Family History  Problem Relation Age of Onset   Ulcerative colitis Father    Seizures Sister    Breast cancer Paternal Aunt        mid-95's age of diagnosis  Crohn's disease Paternal Grandmother     Social History   Tobacco Use  Smoking Status Former   Packs/day: 1.00   Years: 8.00   Pack years: 8.00   Types: Cigarettes  Smokeless Tobacco Never    Past Surgical History:  Procedure Laterality Date   CESAREAN SECTION N/A 04/22/2017   Procedure: CESAREAN SECTION;  Surgeon: Levie Heritage, DO;  Location: [facility];  Service: Obstetrics;  Laterality: N/A;   DILITATION & CURRETTAGE/HYSTROSCOPY WITH NOVASURE ABLATION N/A 01/25/2020   Procedure: DILATATION & CURETTAGE/HYSTEROSCOPY WITH NOVASURE ABLATION;  Surgeon: Conan Bowens, MD;  Location: [facility];  Service: Gynecology;  Laterality: N/A;   HEMATOMA  EVACUATION N/A 05/01/2017   Procedure: EVACUATION HEMATOMA;  Surgeon: Adam Phenix, MD;  Location: [facility];  Service: Gynecology;  Laterality: N/A;   TUBAL LIGATION     WISDOM TOOTH EXTRACTION      Immunization History  Administered Date(s) Administered   Influenza,inj,Quad PF,6+ Mos 09/25/2014, 05/22/2015, 04/24/2017   Influenza-Unspecified 07/04/2019   Tdap 03/10/2013, 02/12/2015, 02/18/2017    No results found for this or any previous visit (from the past 2160 hour(s)).  No results found.     All questions at time of visit were answered - patient instructed to contact office with any additional concerns or updates. ER/RTC precautions were reviewed with the patient as applicable.   Please note: manual typing as well as voice recognition software may have been used to produce this document - typos may escape review. Please contact Dr. Lyn Hollingshead for any needed clarifications.

## 2021-04-03 NOTE — Patient Instructions (Addendum)
Continue HCTZ 25 mg Start Lisinopril 20 mg  Ask cardiology if another Dr/office can get you seen sooner  Will try to figure out what the deal is with the sleep study

## 2021-04-18 ENCOUNTER — Encounter: Payer: Self-pay | Admitting: Family Medicine

## 2021-04-18 ENCOUNTER — Ambulatory Visit (INDEPENDENT_AMBULATORY_CARE_PROVIDER_SITE_OTHER): Admitting: Family Medicine

## 2021-04-18 VITALS — BP 153/98 | HR 79

## 2021-04-18 DIAGNOSIS — I10 Essential (primary) hypertension: Secondary | ICD-10-CM

## 2021-04-18 MED ORDER — LISINOPRIL 20 MG PO TABS: 30.0000 mg | ORAL_TABLET | Freq: Every day | ORAL | 1 refills | Status: DC

## 2021-04-18 NOTE — Progress Notes (Signed)
Established Patient Office Visit  Subjective:  Patient ID: Bonnie White, female    DOB: 06-27-1986  Age: 35 y.o. MRN: [id number]  CC:  Chief Complaint  Patient presents with   Hypertension    HPI [name] presents for hypertension.  Patient has been on HCTZ for about a month.  Lisinopril was added about 2 weeks ago.  Patient reports she is taking both medications every day as prescribed without any significant side effects.  States she has been checking her blood pressure at home and has been getting numbers around 150s/90-100.  Reports her headaches that she was experiencing have significantly improved since starting blood pressure medications.  States when she was initially diagnosed with hypertension her blood pressure was greater than 200/100 in the emergency department.  States she has never had any chest pain, shortness of breath, edema, vision changes, palpitations.   She drinks 2-3 cups of coffee every morning. She is not a smoker. Drinks maybe 3-4 beers per week. Typically eats avocado toast for breakfast, grilled chicken salad or sandwich for lunch, dinner usually baked, chicken, spaghetti, tacos, etc. Feels like she seasons everything with salt when cooking.    Sleep study appointment on 05/14/21. Cardiology appointment on 05/20/21.    Past Medical History:  Diagnosis Date   Depression    Essential hypertension 11/22/2019   Home BP monitor verified and reasonably accurate as of 11/22/19.     Past Surgical History:  Procedure Laterality Date   CESAREAN SECTION N/A 04/22/2017   Procedure: CESAREAN SECTION;  Surgeon: Levie Heritage, DO;  Location: [facility];  Service: Obstetrics;  Laterality: N/A;   DILITATION & CURRETTAGE/HYSTROSCOPY WITH NOVASURE ABLATION N/A 01/25/2020   Procedure: DILATATION & CURETTAGE/HYSTEROSCOPY WITH NOVASURE ABLATION;  Surgeon: Conan Bowens, MD;  Location: [facility];  Service: Gynecology;  Laterality: N/A;    HEMATOMA EVACUATION N/A 05/01/2017   Procedure: EVACUATION HEMATOMA;  Surgeon: Adam Phenix, MD;  Location: [facility];  Service: Gynecology;  Laterality: N/A;   TUBAL LIGATION     WISDOM TOOTH EXTRACTION      Family History  Problem Relation Age of Onset   Ulcerative colitis Father    Seizures Sister    Breast cancer Paternal Aunt        mid-40's age of diagnosis   Crohn's disease Paternal Grandmother     Social History   Socioeconomic History   Marital status: Married    Spouse name: Not on file   Number of children: Not on file   Years of education: Not on file   Highest education level: Not on file  Occupational History   Not on file  Tobacco Use   Smoking status: Former    Packs/day: 1.00    Years: 8.00    Pack years: 8.00    Types: Cigarettes   Smokeless tobacco: Never  Vaping Use   Vaping Use: Never used  Substance and Sexual Activity   Alcohol use: No    Alcohol/week: 0.0 standard drinks    Comment: rarely, maybe 2 per week    Drug use: No   Sexual activity: Yes    Partners: Male    Birth control/protection: Surgical  Other Topics Concern   Not on file  Social History Narrative   Not on file   Social Determinants of Health   Financial Resource Strain: Not on file  Food Insecurity: Not on file  Transportation Needs: Not on file  Physical Activity: Not  on file  Stress: Not on file  Social Connections: Not on file  Intimate Partner Violence: Not on file    Outpatient Medications Prior to Visit  Medication Sig Dispense Refill   Cyanocobalamin (B-12) 100 MCG TABS Take by mouth.     docusate sodium (COLACE) 100 MG capsule Take 1 capsule (100 mg total) by mouth daily. 30 capsule 1   hydrochlorothiazide (HYDRODIURIL) 25 MG tablet Take 1 tablet (25 mg total) by mouth daily. 90 tablet 0   sertraline (ZOLOFT) 100 MG tablet INCREASED DOSE 1 TABLET DAILY 2 WEEKS BEFORE ANTICIPATED PERIOD, DECREASE TO 1/2 TABLET DAILY ON 1ST DAY OF PERIOD. 30 tablet 0    vitamin C (ASCORBIC ACID) 250 MG tablet Take 250 mg by mouth daily.     lisinopril (ZESTRIL) 20 MG tablet Take 1 tablet (20 mg total) by mouth daily. 30 tablet 1   No facility-administered medications prior to visit.    No Known Allergies  ROS Review of Systems All review of systems negative except what is listed in the HPI    Objective:    Physical Exam Constitutional:      Appearance: Normal appearance. She is well-developed.  HENT:     Head: Normocephalic and atraumatic.  Cardiovascular:     Rate and Rhythm: Normal rate and regular rhythm.     Heart sounds: Normal heart sounds.  Pulmonary:     Effort: Pulmonary effort is normal.     Breath sounds: Normal breath sounds.  Musculoskeletal:     Right lower leg: No edema.     Left lower leg: No edema.  Skin:    General: Skin is warm and dry.  Neurological:     Mental Status: She is alert and oriented to person, place, and time.  Psychiatric:        Behavior: Behavior normal.    BP (!) 153/98   Pulse 79   SpO2 95%  Wt Readings from Last 3 Encounters:  04/03/21 190 lb 1.9 oz (86.2 kg)  03/19/21 190 lb 1.9 oz (86.2 kg)  02/20/20 183 lb (83 kg)     Health Maintenance Due  Topic Date Due   Hepatitis C Screening  Never done    There are no preventive care reminders to display for this patient.  Lab Results  Component Value Date   TSH 0.52 11/14/2019   Lab Results  Component Value Date   WBC 5.6 01/23/2020   HGB 13.0 01/23/2020   HCT 38.4 01/23/2020   MCV 93.4 01/23/2020   PLT 325 01/23/2020   Lab Results  Component Value Date   NA 138 01/23/2020   K 4.5 01/23/2020   CO2 24 01/23/2020   GLUCOSE 94 01/23/2020   BUN 9 01/23/2020   CREATININE 0.68 01/23/2020   BILITOT 0.4 11/14/2019   ALKPHOS 107 04/30/2017   AST 19 11/14/2019   ALT 15 11/14/2019   PROT 7.7 11/14/2019   ALBUMIN 3.1 (L) 04/30/2017   CALCIUM 9.0 01/23/2020   ANIONGAP 9 01/23/2020   Lab Results  Component Value Date   CHOL 208 (H)  02/15/2018   Lab Results  Component Value Date   HDL 63 02/15/2018   Lab Results  Component Value Date   LDLCALC 131 (H) 02/15/2018   Lab Results  Component Value Date   TRIG 45 02/15/2018   Lab Results  Component Value Date   CHOLHDL 3.3 02/15/2018   Lab Results  Component Value Date   HGBA1C 4.9 09/16/2016  Assessment & Plan:   Problem List Items Addressed This Visit       Cardiovascular and Mediastinum   Essential hypertension - Primary    BP remains significantly elevated at home and at office.  No alarm findings today.  We will recheck BMP and increase lisinopril to 1.5 tablets (30 mg) daily for the next 2 weeks.  Educated patient on significance of low sodium, heart healthy diet as well as cutting back caffeine.  Would like patient to return in about 2 weeks to recheck blood pressure and BMP after lisinopril increase.  Keep follow-up appointments for sleep study and cardiology.      Relevant Medications   lisinopril (ZESTRIL) 20 MG tablet   Other Relevant Orders   BASIC METABOLIC PANEL WITH GFR    Meds ordered this encounter  Medications   lisinopril (ZESTRIL) 20 MG tablet    Sig: Take 1.5 tablets (30 mg total) by mouth daily.    Dispense:  30 tablet    Refill:  1    Patient aware of signs/symptoms requiring further/urgent evaluation.   Follow-up: Return in about 2 weeks (around 05/02/2021) for BP check, BMP.    Lollie Marrow Reola Calkins, DNP, FNP-C

## 2021-04-18 NOTE — Assessment & Plan Note (Signed)
BP remains significantly elevated at home and at office.  No alarm findings today.  We will recheck BMP and increase lisinopril to 1.5 tablets (30 mg) daily for the next 2 weeks.  Educated patient on significance of low sodium, heart healthy diet as well as cutting back caffeine.  Would like patient to return in about 2 weeks to recheck blood pressure and BMP after lisinopril increase.  Keep follow-up appointments for sleep study and cardiology.

## 2021-04-18 NOTE — Patient Instructions (Signed)
Increase your lisinopril to 1.5 tablets per day.  Follow-up in 2 weeks for blood pressure check and lab work

## 2021-04-19 LAB — BASIC METABOLIC PANEL WITH GFR
BUN: 14 mg/dL (ref 7–25)
CO2: 29 mmol/L (ref 20–32)
Calcium: 9.5 mg/dL (ref 8.6–10.2)
Chloride: 101 mmol/L (ref 98–110)
Creat: 0.75 mg/dL (ref 0.50–0.97)
Glucose, Bld: 96 mg/dL (ref 65–99)
Potassium: 4 mmol/L (ref 3.5–5.3)
Sodium: 137 mmol/L (ref 135–146)
eGFR: 106 mL/min/{1.73_m2} (ref >=60)

## 2021-04-25 ENCOUNTER — Telehealth: Payer: Self-pay

## 2021-04-25 NOTE — Telephone Encounter (Signed)
Medication: Olmesartan Medoxomil 20 mg Prior authorization determination received Medication has been approved Approval dates: 03/26/2021-08/03/2098  Patient aware via: MyChart Pharmacy aware: Yes Provider aware via this encounter

## 2021-04-25 NOTE — Telephone Encounter (Signed)
Medication: Olmesartan Medoxomil 20 mg Prior authorization submitted via CoverMyMeds on 04/25/2021 PA submission pending

## 2021-05-02 ENCOUNTER — Ambulatory Visit

## 2021-05-02 NOTE — Progress Notes (Deleted)
Patient presents today as a nurse visit for a blood pressure check.  Patient states {he/she/they:23295} is taking {his/her/their:21314} medication as prescribed without any side effects/adverse effects. Medication and allergy list reviewed with patient and the pharmacy has been verified.   HA: {yes/no:20286} Dizziness/lightheadedness: {yes/no:20286} Fever: {yes/no:20286} BA: {yes/no:20286} Weakness/Fatigue: {yes/no:20286}  Sinus pain/pressure: {yes/no:20286}  Runny nose: {yes/no:20286}  ST: {yes/no:20286}  ShOB: {yes/no:20286}  CP: {yes/no:20286}  Palps: {yes/no:20286} Abd pain: {yes/no:20286} Dysuria: {yes/no:20286}  N/V/C/D: {yes/no:20286}    Vital Signs at ***:*** ***M Blood Pressure: *** Pulse: *** SpO2: ***%  Vital Signs at ***:*** ***M Blood Pressure: *** Pulse: *** SpO2: ***%   Information shared with *** who instructed me to tell pt to ***, continue to monitor BP at home and keep a log, and to schedule a f/u ***V in *** months.   Pt aware and verbalized understanding. Pt escorted to check out for scheduling.  

## 2021-05-08 NOTE — Progress Notes (Signed)
Referring-Natalie Lyn Hollingshead, DO Reason for referral-hypertension  HPI: 35 year old female for evaluation of hypertension at request of Sunnie Nielsen, DO.  CTA April 2021 showed no renal artery stenosis.  Laboratories September 2022 showed sodium 137, potassium 4.0, BUN 14, creatinine 0.75.  Patient states that she has not had difficulties with hypertension in the past until several months ago when she developed a headache.  She was found to have hypertension.  CTA of the head and neck at that time showed no acute arterial abnormality; 5 mm nodule noted.  Her blood pressure has continued to be high despite addition of hydrochlorothiazide 25 mg daily and lisinopril.  Cardiology now asked to evaluate.  Note she denies dyspnea, chest pain, palpitations or syncope.  Current Outpatient Medications  Medication Sig Dispense Refill   Cyanocobalamin (B-12) 100 MCG TABS Take by mouth.     hydrochlorothiazide (HYDRODIURIL) 25 MG tablet Take 1 tablet (25 mg total) by mouth daily. 90 tablet 0   sertraline (ZOLOFT) 100 MG tablet INCREASED DOSE 1 TABLET DAILY 2 WEEKS BEFORE ANTICIPATED PERIOD, DECREASE TO 1/2 TABLET DAILY ON 1ST DAY OF PERIOD. 30 tablet 0   vitamin C (ASCORBIC ACID) 250 MG tablet Take 250 mg by mouth daily.     docusate sodium (COLACE) 100 MG capsule Take 1 capsule (100 mg total) by mouth daily. 30 capsule 1   lisinopril (ZESTRIL) 40 MG tablet Take 1 tablet (40 mg total) by mouth daily. 90 tablet 3   No current facility-administered medications for this visit.    No Known Allergies   Past Medical History:  Diagnosis Date   Depression    Essential hypertension 11/22/2019   Home BP monitor verified and reasonably accurate as of 11/22/19.     Past Surgical History:  Procedure Laterality Date   CESAREAN SECTION N/A 04/22/2017   Procedure: CESAREAN SECTION;  Surgeon: Levie Heritage, DO;  Location: [facility];  Service: Obstetrics;  Laterality: N/A;   DILITATION &  CURRETTAGE/HYSTROSCOPY WITH NOVASURE ABLATION N/A 01/25/2020   Procedure: DILATATION & CURETTAGE/HYSTEROSCOPY WITH NOVASURE ABLATION;  Surgeon: Conan Bowens, MD;  Location: [facility];  Service: Gynecology;  Laterality: N/A;   HEMATOMA EVACUATION N/A 05/01/2017   Procedure: EVACUATION HEMATOMA;  Surgeon: Adam Phenix, MD;  Location: [facility];  Service: Gynecology;  Laterality: N/A;   TUBAL LIGATION     WISDOM TOOTH EXTRACTION      Social History   Socioeconomic History   Marital status: Married    Spouse name: Not on file   Number of children: 4   Years of education: Not on file   Highest education level: Not on file  Occupational History   Not on file  Tobacco Use   Smoking status: Former    Packs/day: 1.00    Years: 8.00    Pack years: 8.00    Types: Cigarettes   Smokeless tobacco: Never  Vaping Use   Vaping Use: Never used  Substance and Sexual Activity   Alcohol use: No    Alcohol/week: 0.0 standard drinks    Comment: rarely, maybe 2 per week    Drug use: No   Sexual activity: Yes    Partners: Male    Birth control/protection: Surgical  Other Topics Concern   Not on file  Social History Narrative   Not on file   Social Determinants of Health   Financial Resource Strain: Not on file  Food Insecurity: Not on file  Transportation Needs: Not on  file  Physical Activity: Not on file  Stress: Not on file  Social Connections: Not on file  Intimate Partner Violence: Not on file    Family History  Problem Relation Age of Onset   Ulcerative colitis Father    Seizures Sister    Breast cancer Paternal Aunt        mid-56's age of diagnosis   Crohn's disease Paternal Grandmother     ROS: no fevers or chills, productive cough, hemoptysis, dysphasia, odynophagia, melena, hematochezia, dysuria, hematuria, rash, seizure activity, orthopnea, PND, pedal edema, claudication. Remaining systems are negative.  Physical Exam:   Blood pressure (!) 166/112,  pulse 86, height 5\' 4"  (1.626 m), weight 187 lb 6.4 oz (85 kg), SpO2 98 %.  General:  Well developed/well nourished in NAD Skin warm/dry; blood pressure right arm 170/108 and left arm 172/108. Patient not depressed No peripheral clubbing Back-normal HEENT-normal/normal eyelids Neck supple/normal carotid upstroke bilaterally; no bruits; no JVD; no thyromegaly chest - CTA/ normal expansion CV - RRR/normal S1 and S2; no murmurs, rubs or gallops;  PMI nondisplaced Abdomen -NT/ND, no HSM, no mass, + bowel sounds, no bruit 2+ femoral pulses, no bruits Ext-no edema, chords, 2+ DP Neuro-grossly nonfocal  ECG -normal sinus rhythm at a rate of 86, no ST changes.  Personally reviewed  A/P  1 hypertension-blood pressure is elevated today.  We will increase lisinopril to 40 mg daily and continue hydrochlorothiazide.  Check potassium and renal function in 1 week.  I will also check TSH at that time.  She will contact in 2 to 4 weeks and if her blood pressure remains elevated we will likely add labetalol at that time.  Note she is young and has relatively sudden onset of hypertension.  Previous CTA showed no renal artery stenosis but was 1-1/2 years ago.  We will schedule renal Dopplers to exclude renal artery stenosis.  Despite her potassium and sodium being normal we will check plasma renin activity and plasma aldosterone concentration.  We will also check 24-hour urine for metanephrines and catecholamines.  She has been diagnosed with sleep apnea and CPAP is to be initiated.  Note blood pressure was the same in both arms and therefore coarctation unlikely.  2 possible obstructive sleep apnea-she is scheduled to begin CPAP.  [place], MD

## 2021-05-14 ENCOUNTER — Ambulatory Visit (HOSPITAL_BASED_OUTPATIENT_CLINIC_OR_DEPARTMENT_OTHER): Attending: Osteopathic Medicine | Admitting: Internal Medicine

## 2021-05-14 ENCOUNTER — Other Ambulatory Visit: Payer: Self-pay

## 2021-05-14 VITALS — Ht 64.0 in | Wt 190.0 lb

## 2021-05-14 DIAGNOSIS — G4733 Obstructive sleep apnea (adult) (pediatric): Secondary | ICD-10-CM

## 2021-05-14 DIAGNOSIS — I10 Essential (primary) hypertension: Secondary | ICD-10-CM | POA: Diagnosis present

## 2021-05-19 DIAGNOSIS — I10 Essential (primary) hypertension: Secondary | ICD-10-CM | POA: Diagnosis not present

## 2021-05-19 NOTE — Procedures (Signed)
    Patient Name: Bonnie White, Bonnie White Date: 05/14/2021 Gender: Female D.O.B: Jul 28, 1986 Age (years): 35 Referring Provider: Sunnie Nielsen Height (inches): 64 Interpreting Physician: Jetty Duhamel MD, ABSM Weight (lbs): 190 RPSGT: Mayfair Sink BMI: 33 MRN: [id number] Neck Size: <br>  CLINICAL INFORMATION Sleep Study Type: HST Indication for sleep study: OSA Epworth Sleepiness Score: 4  SLEEP STUDY TECHNIQUE A multi-channel overnight portable sleep study was performed. The channels recorded were: nasal airflow, thoracic respiratory movement, and oxygen saturation with a pulse oximetry. Snoring was also monitored.  MEDICATIONS Patient self administered medications include: none reported.  SLEEP ARCHITECTURE Patient was studied for 445.5 minutes. The sleep efficiency was 100.0 % and the patient was supine for 33.5%. The arousal index was 0.0 per hour.  RESPIRATORY PARAMETERS The overall AHI was 11.3 per hour, with a central apnea index of 0 per hour. The oxygen nadir was 85% during sleep.  CARDIAC DATA Mean heart rate during sleep was 69.5 bpm.  IMPRESSIONS - Mild obstructive sleep apnea occurred during this study (AHI = 11.3/h). - Oxygen desaturation was noted during this study (Min O2 = 85%). Mean 96%. - Patient snored.  DIAGNOSIS - Obstructive Sleep Apnea (G47.33)  RECOMMENDATIONS - Suggest CPAP titration sleep study, autopap, a fitted oral appliance or ENT evaluation. - Be careful with alcohol, sedatives and other CNS depressants that may worsen sleep apnea and disrupt normal sleep architecture. - Sleep hygiene should be reviewed to assess factors that may improve sleep quality. - Weight management and regular exercise should be initiated or continued.  [Electronically signed] 05/19/2021 11:05 AM  Jetty Duhamel MD, ABSM Diplomate, American Board of Sleep Medicine   NPI: [id number]                         Jetty Duhamel Diplomate, American Board of Sleep Medicine  ELECTRONICALLY SIGNED ON:  05/19/2021, 11:03 AM [facility] PH: [phone]   FX: [phone] ACCREDITED BY THE AMERICAN ACADEMY OF SLEEP MEDICINE

## 2021-05-20 ENCOUNTER — Other Ambulatory Visit: Payer: Self-pay

## 2021-05-20 ENCOUNTER — Ambulatory Visit (INDEPENDENT_AMBULATORY_CARE_PROVIDER_SITE_OTHER): Admitting: Cardiology

## 2021-05-20 ENCOUNTER — Other Ambulatory Visit: Payer: Self-pay | Admitting: *Deleted

## 2021-05-20 ENCOUNTER — Encounter: Payer: Self-pay | Admitting: Cardiology

## 2021-05-20 VITALS — BP 166/112 | HR 86 | Ht 64.0 in | Wt 187.4 lb

## 2021-05-20 DIAGNOSIS — I10 Essential (primary) hypertension: Secondary | ICD-10-CM

## 2021-05-20 DIAGNOSIS — G4733 Obstructive sleep apnea (adult) (pediatric): Secondary | ICD-10-CM | POA: Diagnosis not present

## 2021-05-20 MED ORDER — LISINOPRIL 40 MG PO TABS
40.0000 mg | ORAL_TABLET | Freq: Every day | ORAL | 3 refills | Status: DC
Start: 2021-05-20 — End: 2022-03-11

## 2021-05-20 NOTE — Patient Instructions (Signed)
Medication Instructions:   INCREASE LISINOPRIL TO 40 MG ONCE DAILY=2 OF THE 20 MG TABLETS ONCE DAILY  *If you need a refill on your cardiac medications before your next appointment, please call your pharmacy*   Lab Work:  Your physician recommends that you return for lab work in: ONE WEEK  If you have labs (blood work) drawn today and your tests are completely normal, you will receive your results only by: [employer] (if you have MyChart) OR A paper copy in the mail If you have any lab test that is abnormal or we need to change your treatment, we will call you to review the results.   Testing/Procedures: Your physician has requested that you have a renal artery duplex. During this test, an ultrasound is used to evaluate blood flow to the kidneys. Allow one hour for this exam. Do not eat after midnight the day before and avoid carbonated beverages. Take your medications as you usually do. [address]   Follow-Up: At [facility], you and your health needs are our priority.  As part of our continuing mission to provide you with exceptional heart care, we have created designated Provider Care Teams.  These Care Teams include your primary Cardiologist (physician) and Advanced Practice Providers (APPs -  Physician Assistants and Nurse Practitioners) who all work together to provide you with the care you need, when you need it.  We recommend signing up for the patient portal called "MyChart".  Sign up information is provided on this After Visit Summary.  MyChart is used to connect with patients for Virtual Visits (Telemedicine).  Patients are able to view lab/test results, encounter notes, upcoming appointments, etc.  Non-urgent messages can be sent to your provider as well.   To learn more about what you can do with MyChart, go to [email or web address].    Your next appointment:   8-12 week(s)  The format for your next appointment:   In Person  Provider:    Olga Millers, MD

## 2021-05-21 ENCOUNTER — Encounter: Payer: Self-pay | Admitting: Family Medicine

## 2021-05-21 ENCOUNTER — Telehealth: Payer: Self-pay | Admitting: Family Medicine

## 2021-05-21 DIAGNOSIS — G4733 Obstructive sleep apnea (adult) (pediatric): Secondary | ICD-10-CM | POA: Insufficient documentation

## 2021-05-21 NOTE — Telephone Encounter (Signed)
Please call patient and let her know that we did receive her sleep study results.  I would like to schedule a appointment to go over those results with her we can either do a virtual or in person what ever she prefers.

## 2021-05-23 NOTE — Telephone Encounter (Signed)
Please contact the patient to schedule an appointment to discuss sleep study results. Thanks in advance.

## 2021-05-23 NOTE — Telephone Encounter (Signed)
Left voicemail for patient to call back to schedule a virtual or in person visit with Dr Linford Arnold to go over sleep study results. AM

## 2021-05-28 ENCOUNTER — Other Ambulatory Visit: Payer: Self-pay

## 2021-05-28 ENCOUNTER — Ambulatory Visit (HOSPITAL_COMMUNITY)
Admission: RE | Admit: 2021-05-28 | Discharge: 2021-05-28 | Disposition: A | Discharge status: Home / Self Care | Source: Ambulatory Visit | Attending: Cardiovascular Disease | Admitting: Cardiovascular Disease

## 2021-05-28 DIAGNOSIS — I10 Essential (primary) hypertension: Secondary | ICD-10-CM | POA: Diagnosis present

## 2021-06-05 ENCOUNTER — Telehealth: Payer: Self-pay | Admitting: *Deleted

## 2021-06-05 LAB — BASIC METABOLIC PANEL
BUN: 10 mg/dL (ref 7–25)
CO2: 28 mmol/L (ref 20–32)
Calcium: 9.4 mg/dL (ref 8.6–10.2)
Chloride: 101 mmol/L (ref 98–110)
Creat: 0.87 mg/dL (ref 0.50–0.97)
Glucose, Bld: 157 mg/dL — ABNORMAL HIGH (ref 65–99)
Potassium: 3.8 mmol/L (ref 3.5–5.3)
Sodium: 137 mmol/L (ref 135–146)

## 2021-06-05 LAB — TSH: TSH: 0.5 mIU/L

## 2021-06-05 NOTE — Telephone Encounter (Signed)
Pt called to go over her sleep study results. Will fwd to [employer] [facility]. She is covering for Dr. Lyn Hollingshead this week.

## 2021-06-06 NOTE — Telephone Encounter (Signed)
LVM for patient to call back to get virtual or in person visit scheduled with Hyman Hopes to discuss sleep study results. AM

## 2021-06-06 NOTE — Telephone Encounter (Signed)
Patient will need to schedule an appointment to discuss her results (in-person or virtual is fine).   Lollie Marrow Reola Calkins, DNP, FNP-C

## 2021-06-06 NOTE — Telephone Encounter (Signed)
Please contact the patient for scheduling to discuss sleep study results. Thanks in advance.

## 2021-06-14 ENCOUNTER — Other Ambulatory Visit: Payer: Self-pay | Admitting: Osteopathic Medicine

## 2021-06-15 LAB — CATECHOLAMINES, FRACTIONATED, URINE, 24 HOUR
Calc Total (E+NE): 47 mcg/24 h (ref 26–121)
Creatinine, Urine mg/day-CATEUR: 1.21 g/(24.h) (ref 0.50–2.15)
Dopamine 24 Hr Urine: 279 mcg/24 h (ref 52–480)
Epinephrine, 24H, Ur: 11 mcg/24 h (ref 2–24)
Norepinephrine, 24H, Ur: 36 mcg/24 h (ref 15–100)
Total Volume: 3000 mL

## 2021-06-15 LAB — METANEPHRINES, URINE, 24 HOUR
METANEPHRINE: 76 mcg/24 h (ref 36–190)
METANEPHRINES, TOTAL: 295 mcg/24 h (ref 115–695)
NORMETANEPHRINE: 219 mcg/24 h (ref 35–482)
Total Volume: 3000 mL

## 2021-06-26 NOTE — Progress Notes (Deleted)
HPI: FU hypertension.  CTA April 2021 showed no renal artery stenosis.  Laboratories September 2022 showed sodium 137, potassium 4.0, BUN 14, creatinine 0.75. CTA of the head and neck at that time showed no acute arterial abnormality; 5 mm nodule noted (no follow-up recommended if low risk).  Renal Dopplers October 2022 showed 1 to 59% bilateral stenosis.  Urine for metanephrines normal at 76, normetanephrines normal at 219, TSH 0.50.  Serum aldosterone and renin activity ratio pending.  Since last seen  Current Outpatient Medications  Medication Sig Dispense Refill   Cyanocobalamin (B-12) 100 MCG TABS Take by mouth.     hydrochlorothiazide (HYDRODIURIL) 25 MG tablet Take 1 tablet (25 mg total) by mouth daily. LAST REFILL. NEEDS TO TRANSITION CARE TO NEW PCP. 90 tablet 0   lisinopril (ZESTRIL) 40 MG tablet Take 1 tablet (40 mg total) by mouth daily. 90 tablet 3   sertraline (ZOLOFT) 100 MG tablet INCREASED DOSE 1 TABLET DAILY 2 WEEKS BEFORE ANTICIPATED PERIOD, DECREASE TO 1/2 TABLET DAILY ON 1ST DAY OF PERIOD. 30 tablet 0   vitamin C (ASCORBIC ACID) 250 MG tablet Take 250 mg by mouth daily.     No current facility-administered medications for this visit.     Past Medical History:  Diagnosis Date   Depression    Essential hypertension 11/22/2019   Home BP monitor verified and reasonably accurate as of 11/22/19.     Past Surgical History:  Procedure Laterality Date   CESAREAN SECTION N/A 04/22/2017   Procedure: CESAREAN SECTION;  Surgeon: Truett Mainland, DO;  Location: [facility];  Service: Obstetrics;  Laterality: N/A;   DILITATION & CURRETTAGE/HYSTROSCOPY WITH NOVASURE ABLATION N/A 01/25/2020   Procedure: DILATATION & CURETTAGE/HYSTEROSCOPY WITH NOVASURE ABLATION;  Surgeon: Sloan Leiter, MD;  Location: [facility];  Service: Gynecology;  Laterality: N/A;   HEMATOMA EVACUATION N/A 05/01/2017   Procedure: EVACUATION HEMATOMA;  Surgeon: Woodroe Mode, MD;   Location: [facility];  Service: Gynecology;  Laterality: N/A;   TUBAL LIGATION     WISDOM TOOTH EXTRACTION      Social History   Socioeconomic History   Marital status: Married    Spouse name: Not on file   Number of children: 4   Years of education: Not on file   Highest education level: Not on file  Occupational History   Not on file  Tobacco Use   Smoking status: Former    Packs/day: 1.00    Years: 8.00    Pack years: 8.00    Types: Cigarettes   Smokeless tobacco: Never  Vaping Use   Vaping Use: Never used  Substance and Sexual Activity   Alcohol use: No    Alcohol/week: 0.0 standard drinks    Comment: rarely, maybe 2 per week    Drug use: No   Sexual activity: Yes    Partners: Male    Birth control/protection: Surgical  Other Topics Concern   Not on file  Social History Narrative   Not on file   Social Determinants of Health   Financial Resource Strain: Not on file  Food Insecurity: Not on file  Transportation Needs: Not on file  Physical Activity: Not on file  Stress: Not on file  Social Connections: Not on file  Intimate Partner Violence: Not on file    Family History  Problem Relation Age of Onset   Ulcerative colitis Father    Seizures Sister    Breast cancer Paternal Aunt  mid-40's age of diagnosis   Crohn's disease Paternal Grandmother     ROS: no fevers or chills, productive cough, hemoptysis, dysphasia, odynophagia, melena, hematochezia, dysuria, hematuria, rash, seizure activity, orthopnea, PND, pedal edema, claudication. Remaining systems are negative.  Physical Exam: Well-developed well-nourished in no acute distress.  Skin is warm and dry.  HEENT is normal.  Neck is supple.  Chest is clear to auscultation with normal expansion.  Cardiovascular exam is regular rate and rhythm.  Abdominal exam nontender or distended. No masses palpated. Extremities show no edema. neuro grossly intact  ECG- personally reviewed  A/P  1  hypertension-at previous office visit lisinopril was increased and hydrochlorothiazide was continued.  Note she does not have renal artery stenosis on her studies.  Urine for catecholamines was normal.  Aldosterone/renin activity pending.  As outlined previously blood pressure equal in both upper extremities and therefore coarctation is less likely.  2 obstructive sleep apnea-continue CPAP.  3 Lung nodule-FU primary care.  Olga Millers, MD

## 2021-07-08 ENCOUNTER — Ambulatory Visit: Admitting: Cardiology

## 2021-09-15 ENCOUNTER — Other Ambulatory Visit: Payer: Self-pay

## 2021-09-15 ENCOUNTER — Emergency Department (INDEPENDENT_AMBULATORY_CARE_PROVIDER_SITE_OTHER)
Admission: EM | Admit: 2021-09-15 | Discharge: 2021-09-15 | Disposition: A | Discharge status: Home / Self Care | Source: Home / Self Care

## 2021-09-15 DIAGNOSIS — J0100 Acute maxillary sinusitis, unspecified: Secondary | ICD-10-CM

## 2021-09-15 DIAGNOSIS — K122 Cellulitis and abscess of mouth: Secondary | ICD-10-CM | POA: Diagnosis not present

## 2021-09-15 LAB — POC SARS CORONAVIRUS 2 AG -  ED: SARS Coronavirus 2 Ag: NEGATIVE

## 2021-09-15 LAB — POCT RAPID STREP A (OFFICE): Rapid Strep A Screen: NEGATIVE

## 2021-09-15 MED ORDER — PREDNISONE 20 MG PO TABS: ORAL_TABLET | ORAL | 0 refills | Status: DC

## 2021-09-15 MED ORDER — AMOXICILLIN-POT CLAVULANATE 875-125 MG PO TABS: 1.0000 | ORAL_TABLET | Freq: Two times a day (BID) | ORAL | 0 refills | Status: DC

## 2021-09-15 NOTE — ED Triage Notes (Signed)
Pt states that she has a sore throat, facial pressure, ear pain (both ears) and body aches. X2 days   Pt states that she isn't vaccinated for covid.  Pt states that she hasn't had flu vaccine.

## 2021-09-15 NOTE — ED Provider Notes (Signed)
[facility]    CSN: [id number] Arrival date & time: 09/15/21  1112      History   Chief Complaint Chief Complaint  Patient presents with   Sore Throat    Sore throat, facial pressure, ear pain (both ears) and body aches.x2 days    HPI Bonnie White is a 36 y.o. female.    HPI 36 year old female presents with sore throat, facial pressure, bilateral otalgia and body aches for 2 days.  Patient reports that she is unvaccinated for COVID-19 and influenza.  PMH significant for HTN.  Past Medical History:  Diagnosis Date   Depression    Essential hypertension 11/22/2019   Home BP monitor verified and reasonably accurate as of 11/22/19.     Patient Active Problem List   Diagnosis Date Noted   OSA (obstructive sleep apnea) 05/21/2021   Essential hypertension 11/22/2019   Abnormal uterine bleeding (AUB) 11/08/2019   Psychophysiological insomnia 08/08/2017   Postpartum depression 07/07/2017   Abdominal wall hematoma 04/30/2017   Status post tubal ligation 04/24/2017   Status post primary low transverse cesarean section 04/24/2017    Past Surgical History:  Procedure Laterality Date   CESAREAN SECTION N/A 04/22/2017   Procedure: CESAREAN SECTION;  Surgeon: Truett Mainland, DO;  Location: [facility];  Service: Obstetrics;  Laterality: N/A;   DILITATION & CURRETTAGE/HYSTROSCOPY WITH NOVASURE ABLATION N/A 01/25/2020   Procedure: DILATATION & CURETTAGE/HYSTEROSCOPY WITH NOVASURE ABLATION;  Surgeon: Sloan Leiter, MD;  Location: [facility];  Service: Gynecology;  Laterality: N/A;   HEMATOMA EVACUATION N/A 05/01/2017   Procedure: EVACUATION HEMATOMA;  Surgeon: Woodroe Mode, MD;  Location: [facility];  Service: Gynecology;  Laterality: N/A;   TUBAL LIGATION     WISDOM TOOTH EXTRACTION      OB History     Gravida  3   Para  3   Term  3   Preterm      AB      Living  3      SAB      IAB      Ectopic      Multiple  0   Live  Births  3            Home Medications    Prior to Admission medications   Medication Sig Start Date End Date Taking? Authorizing Provider  amoxicillin-clavulanate (AUGMENTIN) 875-125 MG tablet Take 1 tablet by mouth every 12 (twelve) hours. 09/15/21  Yes Eliezer Lofts, FNP  Cyanocobalamin (B-12) 100 MCG TABS Take by mouth.   Yes [provider]  predniSONE (DELTASONE) 20 MG tablet Take 3 tabs PO daily x 5 days. 09/15/21  Yes Eliezer Lofts, FNP  sertraline (ZOLOFT) 100 MG tablet INCREASED DOSE 1 TABLET DAILY 2 WEEKS BEFORE ANTICIPATED PERIOD, DECREASE TO 1/2 TABLET DAILY ON 1ST DAY OF PERIOD. 03/19/21  Yes Emeterio Reeve, DO  vitamin C (ASCORBIC ACID) 250 MG tablet Take 250 mg by mouth daily.   Yes [provider]  hydrochlorothiazide (HYDRODIURIL) 25 MG tablet Take 1 tablet (25 mg total) by mouth daily. LAST REFILL. NEEDS TO TRANSITION CARE TO NEW PCP. 06/17/21   Iran Planas L, PA-C  lisinopril (ZESTRIL) 40 MG tablet Take 1 tablet (40 mg total) by mouth daily. 05/20/21   Lelon Perla, MD    Family History Family History  Problem Relation Age of Onset   Ulcerative colitis Father    Seizures Sister    Breast cancer Paternal Aunt  mid-40's age of diagnosis   Crohn's disease Paternal Grandmother     Social History Social History   Tobacco Use   Smoking status: Former    Packs/day: 1.00    Years: 8.00    Pack years: 8.00    Types: Cigarettes   Smokeless tobacco: Never  Vaping Use   Vaping Use: Never used  Substance Use Topics   Alcohol use: Yes    Comment: rarely, maybe 2 per week    Drug use: No     Allergies   Patient has no known allergies.   Review of Systems Review of Systems  HENT:  Positive for congestion, ear pain and sore throat.   Musculoskeletal:  Positive for myalgias.  All other systems reviewed and are negative.   Physical Exam Triage Vital Signs ED Triage Vitals  Enc Vitals Group     BP 09/15/21 1207 (!)  144/103     Pulse Rate 09/15/21 1207 88     Resp 09/15/21 1207 18     Temp 09/15/21 1207 98.7 F (37.1 C)     Temp Source 09/15/21 1207 Oral     SpO2 09/15/21 1207 100 %     Weight 09/15/21 1205 190 lb (86.2 kg)     Height 09/15/21 1205 5\' 4"  (1.626 m)     Head Circumference --      Peak Flow --      Pain Score 09/15/21 1205 7     Pain Loc --      Pain Edu? --      Excl. in [place]? --    No data found.  Updated Vital Signs BP (!) 144/103 (BP Location: Left Arm)    Pulse 88    Temp 98.7 F (37.1 C) (Oral)    Resp 18    Ht 5\' 4"  (1.626 m)    Wt 190 lb (86.2 kg)    SpO2 100%    BMI 32.61 kg/m      Physical Exam Vitals and nursing note reviewed.  Constitutional:      General: She is not in acute distress.    Appearance: Normal appearance. She is obese. She is not ill-appearing.  HENT:     Head: Normocephalic and atraumatic.     Right Ear: Tympanic membrane, ear canal and external ear normal.     Left Ear: Tympanic membrane, ear canal and external ear normal.     Mouth/Throat:     Mouth: Mucous membranes are moist.     Pharynx: Oropharynx is clear. Uvula midline. Posterior oropharyngeal erythema and uvula swelling present.  Eyes:     Extraocular Movements: Extraocular movements intact.     Conjunctiva/sclera: Conjunctivae normal.     Pupils: Pupils are equal, round, and reactive to light.  Cardiovascular:     Rate and Rhythm: Normal rate and regular rhythm.     Pulses: Normal pulses.     Heart sounds: Normal heart sounds.  Pulmonary:     Effort: Pulmonary effort is normal.     Breath sounds: Normal breath sounds.  Musculoskeletal:     Cervical back: Normal range of motion and neck supple.  Skin:    General: Skin is warm and dry.  Neurological:     General: No focal deficit present.     Mental Status: She is alert and oriented to person, place, and time. Mental status is at baseline.     UC Treatments / Results  Labs (all labs ordered are listed, but only  abnormal  results are displayed) Labs Reviewed  POC SARS CORONAVIRUS 2 AG -  ED - Normal  POCT RAPID STREP A (OFFICE) - Normal    EKG   Radiology No results found.  Procedures Procedures (including critical care time)  Medications Ordered in UC Medications - No data to display  Initial Impression / Assessment and Plan / UC Course  I have reviewed the triage vital signs and the nursing notes.  Pertinent labs & imaging results that were available during my care of the patient were reviewed by me and considered in my medical decision making (see chart for details).     MDM: 1.  Uvulitis-Rx'd Prednisone and Augmentin; 2.  Subacute maxillary sinusitis-Rx'd Augmentin. Advised patient to take medication as directed with food to completion.  Advised patient to take prednisone with first dose of Augmentin for the next 5 of 7 days.  Encouraged patient to increase daily water intake while taking these medications.  Patient discharged home, hemodynamically stable. Final Clinical Impressions(s) / UC Diagnoses   Final diagnoses:  Uvulitis  Subacute maxillary sinusitis     Discharge Instructions      Advised patient to take medication as directed with food to completion.  Advised patient to take prednisone with first dose of Augmentin for the next 5 of 7 days.  Encouraged patient to increase daily water intake while taking these medications.     ED Prescriptions     Medication Sig Dispense Auth. Provider   amoxicillin-clavulanate (AUGMENTIN) 875-125 MG tablet Take 1 tablet by mouth every 12 (twelve) hours. 14 tablet Eliezer Lofts, FNP   predniSONE (DELTASONE) 20 MG tablet Take 3 tabs PO daily x 5 days. 15 tablet Eliezer Lofts, FNP      PDMP not reviewed this encounter.   Eliezer Lofts, [place] 09/15/21 1312

## 2021-09-15 NOTE — Discharge Instructions (Addendum)
Advised patient to take medication as directed with food to completion.  Advised patient to take prednisone with first dose of Augmentin for the next 5 of 7 days.  Encouraged patient to increase daily water intake while taking these medications.

## 2022-03-10 ENCOUNTER — Other Ambulatory Visit: Payer: Self-pay | Admitting: *Deleted

## 2022-03-10 ENCOUNTER — Encounter: Payer: Self-pay | Admitting: *Deleted

## 2022-03-10 DIAGNOSIS — R911 Solitary pulmonary nodule: Secondary | ICD-10-CM

## 2022-03-11 ENCOUNTER — Ambulatory Visit (INDEPENDENT_AMBULATORY_CARE_PROVIDER_SITE_OTHER): Admitting: Medical-Surgical

## 2022-03-11 ENCOUNTER — Encounter: Payer: Self-pay | Admitting: Medical-Surgical

## 2022-03-11 VITALS — BP 164/109 | HR 66 | Resp 20 | Ht 64.0 in | Wt 179.9 lb

## 2022-03-11 DIAGNOSIS — G8929 Other chronic pain: Secondary | ICD-10-CM | POA: Diagnosis not present

## 2022-03-11 DIAGNOSIS — Z7689 Persons encountering health services in other specified circumstances: Secondary | ICD-10-CM | POA: Diagnosis not present

## 2022-03-11 DIAGNOSIS — M5441 Lumbago with sciatica, right side: Secondary | ICD-10-CM

## 2022-03-11 DIAGNOSIS — I10 Essential (primary) hypertension: Secondary | ICD-10-CM

## 2022-03-11 MED ORDER — AMLODIPINE BESYLATE 5 MG PO TABS: 5.0000 mg | ORAL_TABLET | Freq: Every day | ORAL | 1 refills | Status: DC

## 2022-03-11 MED ORDER — HYDROCHLOROTHIAZIDE 25 MG PO TABS: 25.0000 mg | ORAL_TABLET | Freq: Every day | ORAL | 1 refills | Status: DC

## 2022-03-11 NOTE — Progress Notes (Signed)
Established Patient Office Visit  Subjective   Patient ID: Bonnie White, female   DOB: Aug 09, 1985 Age: 36 y.o. MRN: [id number]   Chief Complaint  Patient presents with   Transitions Of Care    HPI Pleasant 36 year old female presenting to transfer care to a new PCP  HTN: Currently taking hydrochlorothiazide 25 mg daily but notes that she misses doses at times.  She has been off of the lisinopril 40 mg daily for several months.  Notes that she stopped it because she got a letter stating that there was a recall on all lisinopril doses.  She did not reach out to have something else prescribed in its place.  She checks her blood pressure occasionally but not regularly.  Notes that when she has very high blood pressure, she has significant headaches.  No other symptoms.    Back pain: Had a CT showing some issues with L5-S1 with resulting right low back pain and right-sided sciatica.  Is requesting a referral to neurosurgery to see her husband's provider for next steps and treatment recommendations.  Mood: Reports that her last child had a traumatic birth and she experiencing postpartum depression.  During that time she was put on sertraline.  She has been off of it for the last 3 months and feels like she is doing well and that her symptoms are well-controlled.  Denies SI/HI.   Objective:    Vitals:   03/11/22 1607 03/11/22 1645  BP: (!) 184/107 (!) 164/109  Pulse: 88 66  Resp: 20 20  Height: 5\' 4"  (1.626 m)   Weight: 179 lb 14.4 oz (81.6 kg)   SpO2: 98% 98%  BMI (Calculated): 30.86    Physical Exam Vitals and nursing note reviewed.  Constitutional:      General: She is not in acute distress.    Appearance: Normal appearance. She is obese. She is not ill-appearing.  HENT:     Head: Normocephalic and atraumatic.  Cardiovascular:     Rate and Rhythm: Normal rate and regular rhythm.     Pulses: Normal pulses.     Heart sounds: Normal heart sounds.  Pulmonary:     Effort:  Pulmonary effort is normal. No respiratory distress.     Breath sounds: Normal breath sounds. No wheezing, rhonchi or rales.  Skin:    General: Skin is warm and dry.  Neurological:     Mental Status: She is alert and oriented to person, place, and time.  Psychiatric:        Mood and Affect: Mood normal.        Behavior: Behavior normal.        Thought Content: Thought content normal.        Judgment: Judgment normal.   No results found for this or any previous visit (from the past 24 hour(s)).     The ASCVD Risk score (Arnett DK, et al., 2019) failed to calculate for the following reasons:   The 2019 ASCVD risk score is only valid for ages 35 to 31   Assessment & Plan:   1. Encounter to establish care Reviewed available information and discussed care concerns with patient.   2. Essential hypertension Checking labs as below.  Continue [place] or chlorothiazide 25 mg daily.  She has previously had lisinopril and valsartan which were not helpful in controlling her blood pressure adequately.  Starting amlodipine 5 mg daily.  Recommend checking blood pressure at home with a goal of 130/80 or less.  If consistently  higher, we will need to make changes.  Recommend low-sodium diet and regular intentional exercise with a goal for weight loss. - hydrochlorothiazide (HYDRODIURIL) 25 MG tablet; Take 1 tablet (25 mg total) by mouth daily.  Dispense: 90 tablet; Refill: 1 - amLODipine (NORVASC) 5 MG tablet; Take 1 tablet (5 mg total) by mouth daily.  Dispense: 90 tablet; Refill: 1 - CBC with Differential/Platelet - COMPLETE METABOLIC PANEL WITH GFR - Lipid panel  3. Chronic right-sided low back pain with right-sided sciatica Referring to neurosurgery per patient request.  She has printed out reports as well as her CD with images to take with her to her appointment. - Ambulatory referral to Neurosurgery  Return in about 2 weeks (around 03/25/2022) for nurse visit for BP  check.  ___________________________________________ Thayer Ohm, DNP, APRN, FNP-BC Primary Care and Sports Medicine [facility] [place]

## 2022-03-14 ENCOUNTER — Ambulatory Visit (INDEPENDENT_AMBULATORY_CARE_PROVIDER_SITE_OTHER)

## 2022-03-14 DIAGNOSIS — R911 Solitary pulmonary nodule: Secondary | ICD-10-CM | POA: Diagnosis not present

## 2022-03-25 ENCOUNTER — Ambulatory Visit (INDEPENDENT_AMBULATORY_CARE_PROVIDER_SITE_OTHER): Admitting: Medical-Surgical

## 2022-03-25 VITALS — BP 151/97 | HR 85

## 2022-03-25 DIAGNOSIS — I10 Essential (primary) hypertension: Secondary | ICD-10-CM

## 2022-03-25 MED ORDER — AMLODIPINE BESYLATE 10 MG PO TABS: 10.0000 mg | ORAL_TABLET | Freq: Every day | ORAL | 0 refills | Status: DC

## 2022-03-25 NOTE — Progress Notes (Signed)
Pt informed of medication change and recommendations.  Pt scheduled for NV to recheck BP in 2 weeks.  Tiajuana Amass, CMA

## 2022-03-25 NOTE — Progress Notes (Signed)
Blood pressures are still above goal of 151/97 for her last check.  Since she is still having headaches, recommend increasing amlodipine to 10 mg daily.  New prescription sent to the pharmacy for 10 mg tablets however if she would like to use her current supply, she can use two 5 mg tablets once daily.  Continue hydrochlorothiazide 25 mg daily.  Continue to monitor blood pressures at home with goal of 130/80 or less.  Low-sodium diet, regular intentional exercise, and maintenance of a healthy weight recommended for better blood pressure control.  Return in 2 weeks for a nurse visit for blood pressure check. ___________________________________________ Thayer Ohm, DNP, APRN, FNP-BC Primary Care and Sports Medicine [facility] [place]

## 2022-03-25 NOTE — Progress Notes (Signed)
Pt here for nurse BP check.  Pt denies CP, SOB, dizziness or missed doses of medications.  Pt states that she has been having headaches and was actually awakened this morning at 6AM with a headache.  Tiajuana Amass, CMA

## 2022-03-26 LAB — CBC WITH DIFFERENTIAL/PLATELET
Absolute Monocytes: 345 cells/uL (ref 200–950)
Basophils Absolute: 21 cells/uL (ref 0–200)
Basophils Relative: 0.4 %
Eosinophils Absolute: 58 cells/uL (ref 15–500)
Eosinophils Relative: 1.1 %
HCT: 43 % (ref 35.0–45.0)
Hemoglobin: 14.4 g/dL (ref 11.7–15.5)
Lymphs Abs: 1818 cells/uL (ref 850–3900)
MCH: 31.2 pg (ref 27.0–33.0)
MCHC: 33.5 g/dL (ref 32.0–36.0)
MCV: 93.1 fL (ref 80.0–100.0)
MPV: 11 fL (ref 7.5–12.5)
Monocytes Relative: 6.5 %
Neutro Abs: 3058 cells/uL (ref 1500–7800)
Neutrophils Relative %: 57.7 %
Platelets: 277 10*3/uL (ref 140–400)
RBC: 4.62 10*6/uL (ref 3.80–5.10)
RDW: 11.8 % (ref 11.0–15.0)
Total Lymphocyte: 34.3 %
WBC: 5.3 10*3/uL (ref 3.8–10.8)

## 2022-03-26 LAB — LIPID PANEL
Cholesterol: 192 mg/dL (ref <200)
HDL: 55 mg/dL (ref >=50)
LDL Cholesterol (Calc): 120 mg/dL (calc) — ABNORMAL HIGH
Non-HDL Cholesterol (Calc): 137 mg/dL (calc) — ABNORMAL HIGH (ref <130)
Total CHOL/HDL Ratio: 3.5 (calc) (ref <5.0)
Triglycerides: 75 mg/dL (ref <150)

## 2022-03-26 LAB — COMPLETE METABOLIC PANEL WITH GFR
AG Ratio: 1.5 (calc) (ref 1.0–2.5)
ALT: 10 U/L (ref 6–29)
AST: 15 U/L (ref 10–30)
Albumin: 4.8 g/dL (ref 3.6–5.1)
Alkaline phosphatase (APISO): 49 U/L (ref 31–125)
BUN: 20 mg/dL (ref 7–25)
CO2: 27 mmol/L (ref 20–32)
Calcium: 10.2 mg/dL (ref 8.6–10.2)
Chloride: 99 mmol/L (ref 98–110)
Creat: 0.75 mg/dL (ref 0.50–0.97)
Globulin: 3.3 g/dL (calc) (ref 1.9–3.7)
Glucose, Bld: 105 mg/dL (ref 65–139)
Potassium: 4.6 mmol/L (ref 3.5–5.3)
Sodium: 137 mmol/L (ref 135–146)
Total Bilirubin: 0.6 mg/dL (ref 0.2–1.2)
Total Protein: 8.1 g/dL (ref 6.1–8.1)
eGFR: 106 mL/min/{1.73_m2} (ref >=60)

## 2022-04-03 ENCOUNTER — Ambulatory Visit: Admitting: Medical-Surgical

## 2022-04-08 ENCOUNTER — Ambulatory Visit (INDEPENDENT_AMBULATORY_CARE_PROVIDER_SITE_OTHER): Admitting: Medical-Surgical

## 2022-04-08 VITALS — BP 131/71 | HR 60

## 2022-04-08 DIAGNOSIS — I10 Essential (primary) hypertension: Secondary | ICD-10-CM | POA: Diagnosis not present

## 2022-04-08 NOTE — Progress Notes (Signed)
Agree with documentation as below.  ___________________________________________ Viet Kemmerer L. Jezreel Justiniano, DNP, APRN, FNP-BC Primary Care and Sports Medicine [facility] [place]  

## 2022-04-08 NOTE — Progress Notes (Signed)
   Established Patient Office Visit  Subjective   Patient ID: Bonnie White, female    DOB: October 07, 1985  Age: 36 y.o. MRN: [id number]  Chief Complaint  Patient presents with   Hypertension    HPI  Bonnie White is here for blood pressure check. Denies chest pain, shortness of breath or dizziness.   ROS    Objective:     BP 131/71   Pulse 60   SpO2 100%    Physical Exam   No results found for any visits on 04/08/22.    The ASCVD Risk score (Arnett DK, et al., 2019) failed to calculate for the following reasons:   The 2019 ASCVD risk score is only valid for ages 61 to 48    Assessment & Plan:  Hypertension - Patient advised to continue medications as directed. Follow up in 3 months with Joy for hypertension.   Problem List Items Addressed This Visit       Unprioritized   Essential hypertension - Primary    Return in about 3 months (around 07/08/2022) for HTN with Joy. Earna Coder, Janalyn Harder, CMA

## 2022-07-13 ENCOUNTER — Encounter: Payer: Self-pay | Admitting: Medical-Surgical

## 2022-07-13 NOTE — Progress Notes (Unsigned)
   Established Patient Office Visit  Subjective   Patient ID: KASHAYLA UNGERER, female   DOB: 11/03/1985 Age: 36 y.o. MRN: [id number]   No chief complaint on file.   HPI Pleasant 36 year old female presenting today for follow up on:   Hypertension: Medication: Amlodipine 10mg  daily, HCTZ 25mg  daily Compliant: *** Side effects: *** Checking BP at home: ***, readings *** Low sodium diet: *** Exercise: *** Concerning symptoms: ***    Objective:    There were no vitals filed for this visit.  Physical Exam   No results found for this or any previous visit (from the past 24 hour(s)).   {Labs (Optional):23779}  The ASCVD Risk score (Arnett DK, et al., 2019) failed to calculate for the following reasons:   The 2019 ASCVD risk score is only valid for ages 16 to 78   Assessment & Plan:   No problem-specific Assessment & Plan notes found for this encounter.   No follow-ups on file.  ___________________________________________ 41, DNP, APRN, FNP-BC Primary Care and Sports Medicine [facility] [place]

## 2022-07-14 ENCOUNTER — Encounter: Payer: Self-pay | Admitting: Medical-Surgical

## 2022-07-14 ENCOUNTER — Ambulatory Visit: Admitting: Medical-Surgical

## 2022-07-14 VITALS — BP 119/77 | HR 62 | Resp 20 | Ht 64.0 in | Wt 173.3 lb

## 2022-07-14 DIAGNOSIS — I10 Essential (primary) hypertension: Secondary | ICD-10-CM

## 2022-07-14 DIAGNOSIS — F418 Other specified anxiety disorders: Secondary | ICD-10-CM | POA: Diagnosis not present

## 2022-07-14 MED ORDER — HYDROCHLOROTHIAZIDE 25 MG PO TABS: 25.0000 mg | ORAL_TABLET | Freq: Every day | ORAL | 1 refills | Status: DC

## 2022-07-14 MED ORDER — AMLODIPINE BESYLATE 10 MG PO TABS: 10.0000 mg | ORAL_TABLET | Freq: Every day | ORAL | 1 refills | Status: DC

## 2022-08-26 ENCOUNTER — Other Ambulatory Visit: Payer: Self-pay | Admitting: *Deleted

## 2022-08-26 ENCOUNTER — Encounter: Payer: Self-pay | Admitting: *Deleted

## 2022-08-26 DIAGNOSIS — R911 Solitary pulmonary nodule: Secondary | ICD-10-CM

## 2022-08-27 ENCOUNTER — Ambulatory Visit (INDEPENDENT_AMBULATORY_CARE_PROVIDER_SITE_OTHER)

## 2022-08-27 DIAGNOSIS — R911 Solitary pulmonary nodule: Secondary | ICD-10-CM

## 2022-09-15 ENCOUNTER — Ambulatory Visit
Admission: EM | Admit: 2022-09-15 | Discharge: 2022-09-15 | Disposition: A | Discharge status: Home / Self Care | Attending: Family Medicine | Admitting: Family Medicine

## 2022-09-15 ENCOUNTER — Encounter: Payer: Self-pay | Admitting: Emergency Medicine

## 2022-09-15 DIAGNOSIS — B084 Enteroviral vesicular stomatitis with exanthem: Secondary | ICD-10-CM

## 2022-09-15 LAB — POCT RAPID STREP A (OFFICE): Rapid Strep A Screen: NEGATIVE

## 2022-09-15 MED ORDER — LIDOCAINE VISCOUS HCL 2 % MT SOLN: 5.0000 mL | Freq: Four times a day (QID) | OROMUCOSAL | 0 refills | Status: DC | PRN

## 2022-09-15 NOTE — ED Provider Notes (Addendum)
[facility]    CSN: [id number] Arrival date & time: 09/15/22  1428      History   Chief Complaint Chief Complaint  Patient presents with   Sore Throat    HPI Bonnie White is a 37 y.o. female.   HPI She has a sore throat, rash in her mouth, rash on her hands that is been going on for couple of days.  Tmax 102 degrees.  Both children have just been treated for hand-foot-and-mouth disease.  They are improving.  Her symptoms started over the weekend.  She is here for confirmation.  She wants to make sure she does not have strep because her throat is so painful.  Requests a prescription for Magic mouthwash  I explained that there is a [facility] surgeon of viscous lidocaine and this may not be available, however will prescribe it   Past Medical History:  Diagnosis Date   Depression    Essential hypertension 11/22/2019   Home BP monitor verified and reasonably accurate as of 11/22/19.    Status post primary low transverse cesarean section 04/24/2017   Status post tubal ligation 04/24/2017    Patient Active Problem List   Diagnosis Date Noted   OSA (obstructive sleep apnea) 05/21/2021   Essential hypertension 11/22/2019   Abnormal uterine bleeding (AUB) 11/08/2019   Psychophysiological insomnia 08/08/2017   Postpartum depression 07/07/2017   Abdominal wall hematoma 04/30/2017    Past Surgical History:  Procedure Laterality Date   CESAREAN SECTION N/A 04/22/2017   Procedure: CESAREAN SECTION;  Surgeon: Truett Mainland, DO;  Location: [facility];  Service: Obstetrics;  Laterality: N/A;   DILITATION & CURRETTAGE/HYSTROSCOPY WITH NOVASURE ABLATION N/A 01/25/2020   Procedure: DILATATION & CURETTAGE/HYSTEROSCOPY WITH NOVASURE ABLATION;  Surgeon: Sloan Leiter, MD;  Location: [facility];  Service: Gynecology;  Laterality: N/A;   HEMATOMA EVACUATION N/A 05/01/2017   Procedure: EVACUATION HEMATOMA;  Surgeon: Woodroe Mode, MD;  Location: [facility];  Service: Gynecology;  Laterality: N/A;   TUBAL LIGATION     WISDOM TOOTH EXTRACTION      OB History     Gravida  3   Para  3   Term  3   Preterm      AB      Living  3      SAB      IAB      Ectopic      Multiple  0   Live Births  3            Home Medications    Prior to Admission medications   Medication Sig Start Date End Date Taking? Authorizing Provider  magic mouthwash (lidocaine, diphenhydrAMINE, alum & mag hydroxide) suspension Swish and spit 5 mLs 4 (four) times daily as needed for mouth pain. 09/15/22  Yes Raylene Everts, MD  amLODipine (NORVASC) 10 MG tablet Take 1 tablet (10 mg total) by mouth daily. 07/14/22   Samuel Bouche, NP  Cyanocobalamin (B-12) 100 MCG TABS Take by mouth.    [provider]  hydrochlorothiazide (HYDRODIURIL) 25 MG tablet Take 1 tablet (25 mg total) by mouth daily. 07/14/22   Samuel Bouche, NP  vitamin C (ASCORBIC ACID) 250 MG tablet Take 250 mg by mouth daily.    [provider]    Family History Family History  Problem Relation Age of Onset   Ulcerative colitis Father    Seizures Sister    Breast cancer Paternal Aunt  mid-40's age of diagnosis   Crohn's disease Paternal Grandmother     Social History Social History   Tobacco Use   Smoking status: Former    Packs/day: 1.00    Years: 8.00    Total pack years: 8.00    Types: Cigarettes   Smokeless tobacco: Never  Vaping Use   Vaping Use: Never used  Substance Use Topics   Alcohol use: Yes    Comment: rarely, maybe 2 per week    Drug use: No     Allergies   Patient has no known allergies.   Review of Systems Review of Systems See HPI  Physical Exam Triage Vital Signs ED Triage Vitals  Enc Vitals Group     BP 09/15/22 1445 (!) 145/94     Pulse Rate 09/15/22 1445 (!) 54     Resp 09/15/22 1445 14     Temp 09/15/22 1445 98.7 F (37.1 C)     Temp Source 09/15/22 1445 Oral     SpO2 09/15/22 1445 98 %     Weight  09/15/22 1448 170 lb (77.1 kg)     Height 09/15/22 1448 5' 4"$  (1.626 m)     Head Circumference --      Peak Flow --      Pain Score 09/15/22 1447 6     Pain Loc --      Pain Edu? --      Excl. in [place]? --    No data found.  Updated Vital Signs BP (!) 145/94 (BP Location: Left Arm)   Pulse (!) 54   Temp 98.7 F (37.1 C) (Oral)   Resp 14   Ht 5' 4"$  (1.626 m)   Wt 77.1 kg   SpO2 98%   BMI 29.18 kg/m      Physical Exam Constitutional:      General: She is not in acute distress.    Appearance: She is well-developed. She is ill-appearing.  HENT:     Head: Normocephalic and atraumatic.     Right Ear: Tympanic membrane and ear canal normal.     Left Ear: Tympanic membrane and ear canal normal.     Nose: No congestion.     Mouth/Throat:     Mouth: Mucous membranes are moist. Oral lesions present.     Pharynx: Uvula midline. Pharyngeal swelling and posterior oropharyngeal erythema present.     Tonsils: 0 on the right. 0 on the left.  Eyes:     Conjunctiva/sclera: Conjunctivae normal.     Pupils: Pupils are equal, round, and reactive to light.  Cardiovascular:     Rate and Rhythm: Normal rate and regular rhythm.  Pulmonary:     Effort: Pulmonary effort is normal. No respiratory distress.     Breath sounds: Normal breath sounds.  Abdominal:     General: There is no distension.     Palpations: Abdomen is soft.  Musculoskeletal:        General: Normal range of motion.     Cervical back: Normal range of motion.  Lymphadenopathy:     Cervical: Cervical adenopathy present.  Skin:    General: Skin is warm and dry.     Findings: Rash present.  Neurological:     Mental Status: She is alert.      UC Treatments / Results  Labs (all labs ordered are listed, but only abnormal results are displayed) Labs Reviewed  POCT RAPID STREP A (OFFICE)    EKG   Radiology No results  found.  Procedures Procedures (including critical care time)  Medications Ordered in  UC Medications - No data to display  Initial Impression / Assessment and Plan / UC Course  I have reviewed the triage vital signs and the nursing notes.  Pertinent labs & imaging results that were available during my care of the patient were reviewed by me and considered in my medical decision making (see chart for details).     Scattered rash on hands.  Few vesicles around lips.  Posterior pharynx is full of vesicles.  Clearly she has coxsackievirus/hand-foot-and-mouth disease. Final Clinical Impressions(s) / UC Diagnoses   Final diagnoses:  Hand, foot and mouth disease (HFMD)     Discharge Instructions      I have ordered Magic mouthwash for your mouth and throat pain.  Only certain pharmacies will fill this Drink lots of fluids Take Tylenol or ibuprofen for your throat pain May use Chloraseptic spray or lozenge Salt water gargles Expect improvement in 7 to 10 days   ED Prescriptions     Medication Sig Dispense Auth. Provider   magic mouthwash (lidocaine, diphenhydrAMINE, alum & mag hydroxide) suspension Swish and spit 5 mLs 4 (four) times daily as needed for mouth pain. 360 mL Raylene Everts, MD      PDMP not reviewed this encounter.   Raylene Everts, MD 09/15/22 1524    Raylene Everts, MD 09/15/22 1630

## 2022-09-15 NOTE — Discharge Instructions (Signed)
I have ordered Magic mouthwash for your mouth and throat pain.  Only certain pharmacies will fill this Drink lots of fluids Take Tylenol or ibuprofen for your throat pain May use Chloraseptic spray or lozenge Salt water gargles Expect improvement in 7 to 10 days

## 2022-09-15 NOTE — ED Triage Notes (Signed)
Sore throat since Saturday  Right sided  ear & throat pain Tmax 102 Rash  on hands and arms since this am  Both kids had hand, foot & mouth last week  Here w/ kids Tylenol 1 gm at 1100

## 2023-01-13 ENCOUNTER — Encounter: Payer: Self-pay | Admitting: Medical-Surgical

## 2023-01-13 ENCOUNTER — Ambulatory Visit (INDEPENDENT_AMBULATORY_CARE_PROVIDER_SITE_OTHER): Admitting: Medical-Surgical

## 2023-01-13 VITALS — BP 147/86 | HR 61 | Resp 20 | Ht 64.0 in | Wt 167.5 lb

## 2023-01-13 DIAGNOSIS — N939 Abnormal uterine and vaginal bleeding, unspecified: Secondary | ICD-10-CM

## 2023-01-13 DIAGNOSIS — I10 Essential (primary) hypertension: Secondary | ICD-10-CM | POA: Diagnosis not present

## 2023-01-13 MED ORDER — AMLODIPINE BESYLATE 10 MG PO TABS: 10.0000 mg | ORAL_TABLET | Freq: Every day | ORAL | 1 refills | Status: DC

## 2023-01-13 MED ORDER — HYDROCHLOROTHIAZIDE 25 MG PO TABS: 25.0000 mg | ORAL_TABLET | Freq: Every day | ORAL | 1 refills | Status: DC

## 2023-01-13 NOTE — Progress Notes (Signed)
        Established patient visit  History, exam, impression, and plan:  1. Essential hypertension Very pleasant 37 year old female presenting today to follow-up on hypertension.  She has been taking amlodipine 10 mg daily and hydrochlorothiazide 25 mg daily.  Admits to occasional missed doses but this is infrequent.  Going to the gym 5 days a week.  Following low-sodium heart healthy diet.  Notes that her home blood pressures are higher in the morning as she tends to drink a couple of cups of coffee along with the stress of getting everyone ready to go for the day.  Home readings noted 128-140/70-82 on average.  Denies CP, SOB, LE edema, HA, dizziness, palpitations, and syncopal episodes.  On exam, HRRR, S1/S2 normal.  No peripheral edema.  Lungs CTA, respirations even and unlabored.  Continue working to limit caffeine intake.  Continue regular intentional exercise and low-sodium heart healthy diet.  Monitor blood pressure at home with a goal of 130/80 or less.  If consistently higher than this, we will need to make changes but for now continue amlodipine and HCTZ as prescribed. - amLODipine (NORVASC) 10 MG tablet; Take 1 tablet (10 mg total) by mouth daily.  Dispense: 90 tablet; Refill: 1 - hydrochlorothiazide (HYDRODIURIL) 25 MG tablet; Take 1 tablet (25 mg total) by mouth daily.  Dispense: 90 tablet; Refill: 1  2. Abnormal uterine bleeding (AUB) Continues to have issues with abnormal uterine bleeding.  Had a uterine ablation a couple of years ago which worked well for about 1 year.  After that, had significant stress over a period of approximately 9 months.  Restarted with irregular bleeding, sometimes heavy, sometimes spotting.  Due for a Pap smear next month.  Has not seen OB/GYN since her ablation.  Referring to OB/GYN to get her back in their system and discuss further options to manage uterine bleeding. - Ambulatory referral to Obstetrics / Gynecology  Procedures performed this  visit: None.  Return in about 6 months (around 07/15/2023) for HTN follow up or sooner if needed.  __________________________________ Thayer Ohm, DNP, APRN, FNP-BC Primary Care and Sports Medicine [facility] [place]

## 2023-03-01 ENCOUNTER — Other Ambulatory Visit: Payer: Self-pay

## 2023-03-01 ENCOUNTER — Encounter: Payer: Self-pay | Admitting: Emergency Medicine

## 2023-03-01 ENCOUNTER — Ambulatory Visit
Admission: EM | Admit: 2023-03-01 | Discharge: 2023-03-01 | Disposition: A | Discharge status: Home / Self Care | Attending: Family Medicine | Admitting: Family Medicine

## 2023-03-01 DIAGNOSIS — H5789 Other specified disorders of eye and adnexa: Secondary | ICD-10-CM | POA: Diagnosis not present

## 2023-03-01 DIAGNOSIS — R220 Localized swelling, mass and lump, head: Secondary | ICD-10-CM

## 2023-03-01 MED ORDER — PREDNISONE 20 MG PO TABS: ORAL_TABLET | ORAL | 0 refills | Status: DC

## 2023-03-01 MED ORDER — MOXIFLOXACIN HCL 0.5 % OP SOLN: 1.0000 [drp] | Freq: Three times a day (TID) | OPHTHALMIC | 0 refills | Status: AC

## 2023-03-01 NOTE — Discharge Instructions (Addendum)
Advised patient to take medication as directed with food to completion.  Advised patient may use OTC blink tears daily as needed for right/left eye irritation.  Advised patient if right eye irritation worsens may use Vigamox eyedrops as directed.  Advised if symptoms worsen and/or unresolved please follow-up with your optometrist/ophthalmologist for further evaluation.

## 2023-03-01 NOTE — ED Triage Notes (Signed)
Patient presents to Urgent Care with complaints of right eye swelling since 1 day ago. She states it felt like something was in the eye and irritated. Thought it was an eyelash. Then the eye started swelling. Still some swelling under the eye. Took Ibuprofen and used Clear eye drops. Denies any vision changes of the right eye.

## 2023-03-01 NOTE — ED Provider Notes (Signed)
[facility]    CSN: [id number] Arrival date & time: 03/01/23  1032      History   Chief Complaint Chief Complaint  Patient presents with   Facial Swelling    Right    HPI Bonnie White is a 37 y.o. female.   HPI 37 year old female presents with right eye swelling for 1 day believes that she may have irritated her eye in someway either with eyelash or other.  Patient reports using OTC ibuprofen and clear eye eyedrops.  Denies any vision changes to right eye.  Patient is traveling tomorrow morning on vacation and is concerned that right eye irritation could worsen PMH significant for obesity, HTN, and depression.  Past Medical History:  Diagnosis Date   Depression    Essential hypertension 11/22/2019   Home BP monitor verified and reasonably accurate as of 11/22/19.    Psychophysiological insomnia 08/08/2017   Status post primary low transverse cesarean section 04/24/2017   Status post tubal ligation 04/24/2017    Patient Active Problem List   Diagnosis Date Noted   OSA (obstructive sleep apnea) 05/21/2021   Essential hypertension 11/22/2019   Abnormal uterine bleeding (AUB) 11/08/2019    Past Surgical History:  Procedure Laterality Date   CESAREAN SECTION N/A 04/22/2017   Procedure: CESAREAN SECTION;  Surgeon: Levie Heritage, DO;  Location: [facility];  Service: Obstetrics;  Laterality: N/A;   DILITATION & CURRETTAGE/HYSTROSCOPY WITH NOVASURE ABLATION N/A 01/25/2020   Procedure: DILATATION & CURETTAGE/HYSTEROSCOPY WITH NOVASURE ABLATION;  Surgeon: Conan Bowens, MD;  Location: [facility];  Service: Gynecology;  Laterality: N/A;   HEMATOMA EVACUATION N/A 05/01/2017   Procedure: EVACUATION HEMATOMA;  Surgeon: Adam Phenix, MD;  Location: [facility];  Service: Gynecology;  Laterality: N/A;   TUBAL LIGATION     WISDOM TOOTH EXTRACTION      OB History     Gravida  3   Para  3   Term  3   Preterm      AB      Living  3       SAB      IAB      Ectopic      Multiple  0   Live Births  3            Home Medications    Prior to Admission medications   Medication Sig Start Date End Date Taking? Authorizing Provider  amLODipine (NORVASC) 10 MG tablet Take 1 tablet (10 mg total) by mouth daily. 01/13/23  Yes Christen Butter, NP  hydrochlorothiazide (HYDRODIURIL) 25 MG tablet Take 1 tablet (25 mg total) by mouth daily. 01/13/23  Yes Christen Butter, NP  moxifloxacin (VIGAMOX) 0.5 % ophthalmic solution Place 1 drop into the right eye 3 (three) times daily for 5 days. 03/01/23 03/06/23 Yes Trevor Iha, FNP  predniSONE (DELTASONE) 20 MG tablet Take 3 tabs PO daily x 5 days. 03/01/23  Yes Trevor Iha, FNP  Cyanocobalamin (B-12) 100 MCG TABS Take by mouth.    [provider]  vitamin C (ASCORBIC ACID) 250 MG tablet Take 250 mg by mouth daily.    [provider]    Family History Family History  Problem Relation Age of Onset   Ulcerative colitis Father    Seizures Sister    Breast cancer Paternal Aunt        mid-40's age of diagnosis   Crohn's disease Paternal Grandmother     Social History Social History  Tobacco Use   Smoking status: Former    Current packs/day: 1.00    Average packs/day: 1 pack/day for 8.0 years (8.0 ttl pk-yrs)    Types: Cigarettes   Smokeless tobacco: Never  Vaping Use   Vaping status: Never Used  Substance Use Topics   Alcohol use: Yes    Comment: rarely, maybe 2 per week    Drug use: No     Allergies   Patient has no known allergies.   Review of Systems Review of Systems  Eyes:  Positive for pain and redness.     Physical Exam Triage Vital Signs ED Triage Vitals  Encounter Vitals Group     BP 03/01/23 1047 (!) 156/96     Systolic BP Percentile --      Diastolic BP Percentile --      Pulse Rate 03/01/23 1047 64     Resp 03/01/23 1047 18     Temp 03/01/23 1047 99 F (37.2 C)     Temp Source 03/01/23 1047 Oral     SpO2 03/01/23 1047 98 %      Weight --      Height --      Head Circumference --      Peak Flow --      Pain Score 03/01/23 1046 2     Pain Loc --      Pain Education --      Exclude from Growth Chart --    No data found.  Updated Vital Signs BP (!) 156/96 (BP Location: Left Arm)   Pulse 64   Temp 99 F (37.2 C) (Oral)   Resp 18   SpO2 98%      Physical Exam Vitals and nursing note reviewed.  Constitutional:      Appearance: Normal appearance. She is normal weight.  HENT:     Head: Normocephalic and atraumatic.     Mouth/Throat:     Mouth: Mucous membranes are moist.     Pharynx: Oropharynx is clear.  Eyes:     Extraocular Movements: Extraocular movements intact.     Conjunctiva/sclera: Conjunctivae normal.     Pupils: Pupils are equal, round, and reactive to light.     Comments: Right eye: Sclera with +1 injection; right inferior orbit moderate soft tissue swelling noted  Cardiovascular:     Rate and Rhythm: Normal rate and regular rhythm.     Pulses: Normal pulses.     Heart sounds: Normal heart sounds.  Pulmonary:     Effort: Pulmonary effort is normal.     Breath sounds: Normal breath sounds. No wheezing, rhonchi or rales.  Musculoskeletal:        General: Normal range of motion.     Cervical back: Normal range of motion and neck supple.  Skin:    General: Skin is warm and dry.  Neurological:     General: No focal deficit present.     Mental Status: She is alert and oriented to person, place, and time. Mental status is at baseline.  Psychiatric:        Mood and Affect: Mood normal.        Behavior: Behavior normal.      UC Treatments / Results  Labs (all labs ordered are listed, but only abnormal results are displayed) Labs Reviewed - No data to display  EKG   Radiology No results found.  Procedures Procedures (including critical care time)  Medications Ordered in UC Medications - No data to display  Initial Impression / Assessment and Plan / UC Course  I have  reviewed the triage vital signs and the nursing notes.  Pertinent labs & imaging results that were available during my care of the patient were reviewed by me and considered in my medical decision making (see chart for details).     MDM: 1.  Right facial swelling-right inferior orbit specifically presumably from previous irritation by patient Rx'd prednisone 60 mg daily x 5 days; 2.  Irritation of right eye-Rx'd Vigamox 0.5% ophthalmic solution: Instill 1 drop 3 times daily x 5 days if needed while traveling. Advised patient to take medication as directed with food to completion.  Advised patient may use OTC blink tears daily as needed for right/left eye irritation.  Advised patient if right eye irritation worsens may use Vigamox eyedrops as directed.  Advised if symptoms worsen and/or unresolved please follow-up with your optometrist/ophthalmologist for further evaluation.  Patient discharged home, hemodynamically stable.  Final Clinical Impressions(s) / UC Diagnoses   Final diagnoses:  Irritation of right eye  Right facial swelling     Discharge Instructions      Advised patient to take medication as directed with food to completion.  Advised patient may use OTC blink tears daily as needed for right/left eye irritation.  Advised patient if right eye irritation worsens may use Vigamox eyedrops as directed.  Advised if symptoms worsen and/or unresolved please follow-up with your optometrist/ophthalmologist for further evaluation.     ED Prescriptions     Medication Sig Dispense Auth. Provider   predniSONE (DELTASONE) 20 MG tablet Take 3 tabs PO daily x 5 days. 15 tablet Trevor Iha, FNP   moxifloxacin (VIGAMOX) 0.5 % ophthalmic solution Place 1 drop into the right eye 3 (three) times daily for 5 days. 3 mL Trevor Iha, FNP      PDMP not reviewed this encounter.   Trevor Iha, FNP 03/01/23 1130

## 2023-03-18 ENCOUNTER — Encounter: Payer: Self-pay | Admitting: Medical-Surgical

## 2023-03-24 ENCOUNTER — Ambulatory Visit
Admission: EM | Admit: 2023-03-24 | Discharge: 2023-03-24 | Disposition: A | Discharge status: Home / Self Care | Attending: Family Medicine | Admitting: Family Medicine

## 2023-03-24 ENCOUNTER — Ambulatory Visit

## 2023-03-24 DIAGNOSIS — R1032 Left lower quadrant pain: Secondary | ICD-10-CM | POA: Diagnosis not present

## 2023-03-24 LAB — POCT URINALYSIS DIP (MANUAL ENTRY)
Bilirubin, UA: NEGATIVE
Blood, UA: NEGATIVE
Glucose, UA: NEGATIVE mg/dL
Ketones, POC UA: NEGATIVE mg/dL
Leukocytes, UA: NEGATIVE
Nitrite, UA: NEGATIVE
Protein Ur, POC: NEGATIVE mg/dL
Spec Grav, UA: 1.015 (ref 1.010–1.025)
Urobilinogen, UA: 0.2 E.U./dL
pH, UA: 6 (ref 5.0–8.0)

## 2023-03-24 MED ORDER — PREDNISONE 10 MG (21) PO TBPK: ORAL_TABLET | Freq: Every day | ORAL | 0 refills | Status: AC

## 2023-03-24 MED ORDER — IOHEXOL 300 MG/ML  SOLN
100.0000 mL | Freq: Once | INTRAMUSCULAR | Status: AC | PRN
  Administered 2023-03-24: 100 mL via INTRAVENOUS

## 2023-03-24 MED ORDER — IOHEXOL 9 MG/ML PO SOLN: 500.0000 mL | ORAL | Status: AC

## 2023-03-24 NOTE — Discharge Instructions (Addendum)
Advised patient of CT of abdomen and pelvis with IV and oral contrast results with hardcopy results provided.  Advised patient to take medication as directed with food to completion.  Encouraged increase daily water intake to 64 ounces per day while taking this medication.  Advised if symptoms worsen and/or unresolved please follow-up with PCP, GI or here for further evaluation.

## 2023-03-24 NOTE — ED Triage Notes (Signed)
Pt c/o LLQ pain since Sunday. Worse last night. Some worsening pain with urination.

## 2023-03-24 NOTE — ED Provider Notes (Signed)
[name]    CSN: [id number] Arrival date & time: 03/24/23  8413      History   Chief Complaint Chief Complaint  Patient presents with   Abdominal Pain    LLQ    HPI Bonnie White is a 37 y.o. female.   HPI 71-year-old female presents with left lower quadrant pain for 2 days, worse last night worsening with pain and urination.  PMH significant for HTN, abnormal uterine bleeding, and OSA.  Past Medical History:  Diagnosis Date   Depression    Essential hypertension 11/22/2019   Home BP monitor verified and reasonably accurate as of 11/22/19.    Psychophysiological insomnia 08/08/2017   Status post primary low transverse cesarean section 04/24/2017   Status post tubal ligation 04/24/2017    Patient Active Problem List   Diagnosis Date Noted   OSA (obstructive sleep apnea) 05/21/2021   Essential hypertension 11/22/2019   Abnormal uterine bleeding (AUB) 11/08/2019    Past Surgical History:  Procedure Laterality Date   CESAREAN SECTION N/A 04/22/2017   Procedure: CESAREAN SECTION;  Surgeon: Levie Heritage, DO;  Location: [facility];  Service: Obstetrics;  Laterality: N/A;   DILITATION & CURRETTAGE/HYSTROSCOPY WITH NOVASURE ABLATION N/A 01/25/2020   Procedure: DILATATION & CURETTAGE/HYSTEROSCOPY WITH NOVASURE ABLATION;  Surgeon: Conan Bowens, MD;  Location: [facility];  Service: Gynecology;  Laterality: N/A;   HEMATOMA EVACUATION N/A 05/01/2017   Procedure: EVACUATION HEMATOMA;  Surgeon: Adam Phenix, MD;  Location: [facility];  Service: Gynecology;  Laterality: N/A;   TUBAL LIGATION     WISDOM TOOTH EXTRACTION      OB History     Gravida  3   Para  3   Term  3   Preterm      AB      Living  3      SAB      IAB      Ectopic      Multiple  0   Live Births  3            Home Medications    Prior to Admission medications   Medication Sig Start Date End Date Taking? Authorizing Provider  predniSONE  (STERAPRED UNI-PAK 21 TAB) 10 MG (21) TBPK tablet Take by mouth daily. Take 6 tabs by mouth daily  for 2 days, then 5 tabs for 2 days, then 4 tabs for 2 days, then 3 tabs for 2 days, 2 tabs for 2 days, then 1 tab by mouth daily for 2 days 03/24/23  Yes Trevor Iha, FNP  amLODipine (NORVASC) 10 MG tablet Take 1 tablet (10 mg total) by mouth daily. 01/13/23   Christen Butter, NP  Cyanocobalamin (B-12) 100 MCG TABS Take by mouth.    [provider]  hydrochlorothiazide (HYDRODIURIL) 25 MG tablet Take 1 tablet (25 mg total) by mouth daily. 01/13/23   Christen Butter, NP  vitamin C (ASCORBIC ACID) 250 MG tablet Take 250 mg by mouth daily.    [provider]    Family History Family History  Problem Relation Age of Onset   Ulcerative colitis Father    Seizures Sister    Breast cancer Paternal Aunt        mid-40's age of diagnosis   Crohn's disease Paternal Grandmother     Social History Social History   Tobacco Use   Smoking status: Former    Current packs/day: 1.00    Average packs/day: 1 pack/day  for 8.0 years (8.0 ttl pk-yrs)    Types: Cigarettes   Smokeless tobacco: Never  Vaping Use   Vaping status: Never Used  Substance Use Topics   Alcohol use: Yes    Comment: rarely, maybe 2 per week    Drug use: No     Allergies   Patient has no known allergies.   Review of Systems Review of Systems   Physical Exam Triage Vital Signs ED Triage Vitals  Encounter Vitals Group     BP 03/24/23 0926 (!) 177/113     Systolic BP Percentile --      Diastolic BP Percentile --      Pulse Rate 03/24/23 0926 67     Resp 03/24/23 0926 17     Temp 03/24/23 0926 98.2 F (36.8 C)     Temp Source 03/24/23 0926 Oral     SpO2 03/24/23 0926 99 %     Weight --      Height --      Head Circumference --      Peak Flow --      Pain Score 03/24/23 0925 5     Pain Loc --      Pain Education --      Exclude from Growth Chart --    No data found.  Updated Vital Signs BP (!)  177/113 (BP Location: Right Arm)   Pulse 67   Temp 98.2 F (36.8 C) (Oral)   Resp 17   SpO2 99%    Physical Exam Vitals and nursing note reviewed.  Constitutional:      Appearance: She is well-developed and normal weight.  HENT:     Head: Normocephalic and atraumatic.     Mouth/Throat:     Mouth: Mucous membranes are moist.     Pharynx: Oropharynx is clear.  Eyes:     Extraocular Movements: Extraocular movements intact.     Pupils: Pupils are equal, round, and reactive to light.  Cardiovascular:     Rate and Rhythm: Normal rate and regular rhythm.     Heart sounds: Normal heart sounds. No murmur heard.    Comments: Hypertensive Pulmonary:     Effort: Pulmonary effort is normal.     Breath sounds: Normal breath sounds. No wheezing, rhonchi or rales.  Abdominal:     General: Abdomen is flat. Bowel sounds are absent. There is no distension or abdominal bruit.     Palpations: There is no shifting dullness, fluid wave, hepatomegaly, splenomegaly, mass or pulsatile mass.     Tenderness: There is abdominal tenderness in the left lower quadrant. There is no right CVA tenderness, left CVA tenderness, guarding or rebound. Negative signs include Murphy's sign, Rovsing's sign, McBurney's sign and obturator sign.     Hernia: No hernia is present.  Skin:    General: Skin is warm and dry.  Neurological:     General: No focal deficit present.     Mental Status: She is alert and oriented to person, place, and time.  Psychiatric:        Mood and Affect: Mood normal.        Behavior: Behavior normal.      UC Treatments / Results  Labs (all labs ordered are listed, but only abnormal results are displayed) Labs Reviewed  POCT URINALYSIS DIP (MANUAL ENTRY)    EKG   Radiology CT ABDOMEN PELVIS W CONTRAST  Result Date: 03/24/2023 CLINICAL DATA:  Left lower quadrant pain for 3 days. Worse last night.  Some dysuria. Diarrhea. EXAM: CT ABDOMEN AND PELVIS WITH CONTRAST TECHNIQUE:  Multidetector CT imaging of the abdomen and pelvis was performed using the standard protocol following bolus administration of intravenous contrast. RADIATION DOSE REDUCTION: This exam was performed according to the departmental dose-optimization program which includes automated exposure control, adjustment of the mA and/or kV according to patient size and/or use of iterative reconstruction technique. CONTRAST:  OMNIPAQUE IOHEXOL 300 MG/ML  SOLN COMPARISON:  11/23/2019 FINDINGS: Lower chest: 4 mm pleural-based right lower lobe nodule on 12/3 was present on the prior and can be presumed benign. Normal heart size without pericardial or pleural effusion. Hepatobiliary: Inferior right hepatic lobe too small to characterize lesion is similar on 37/2 and presumed benign. The pericholecystic liver lesion on the prior is less distinct today, also considered benign. No suspicious liver lesion or biliary abnormality. Pancreas: Normal, without mass or ductal dilatation. Spleen: Normal in size, without focal abnormality. Adrenals/Urinary Tract: Normal adrenal glands. Normal kidneys, without hydronephrosis. Normal urinary bladder. Stomach/Bowel: Normal stomach, without wall thickening. Normal colon, appendix, and terminal ileum. Normal small bowel. Vascular/Lymphatic: Normal caliber of the aorta and branch vessels. No abdominopelvic adenopathy. Reproductive: 4.2 cm fluid density right ovarian lesion on 60/2. 3.3 cm left ovarian lesion on 58/2. Right ovarian 7 mm calcification is likely dystrophic. Subtly heterogeneous myometrium is nonspecific. Other: No significant free fluid.  No free intraperitoneal air. Musculoskeletal: Degenerative disc disease at the lumbosacral junction. IMPRESSION: 1.  No acute process or explanation for patient's symptoms. 2. Right larger than left simple appearing ovarian cysts which per consensus criteria no follow-up imaging is recommended. This recommendation follows ACR consensus guidelines:  White Paper of the ACR Incidental Findings Committee II on Adnexal Findings. J Am Coll Radiol [phone]. Electronically Signed   By: Jeronimo Greaves M.D.   On: 03/24/2023 14:17    Procedures Procedures (including critical care time)  Medications Ordered in UC Medications - No data to display  Initial Impression / Assessment and Plan / UC Course  I have reviewed the triage vital signs and the nursing notes.  Pertinent labs & imaging results that were available during my care of the patient were reviewed by me and considered in my medical decision making (see chart for details).     MDM: 1.  Abdominal pain, left lower quadrant-UA unremarkable, CT of abdomen and pelvis with IV and oral contrast revealed above.  Patient advised with final hardcopy read provided to patient prior to discharge.  Rx'd Sterapred Unipak (tapering from 60 mg to 10 mg over 10 days. Advised patient of CT of abdomen and pelvis with IV and oral contrast results with hardcopy results provided.  Advised patient to take medication as directed with food to completion.  Encouraged increase daily water intake to 64 ounces per day while taking this medication.  Advised if symptoms worsen and/or unresolved please follow-up with PCP, GI or here for further evaluation.  Patient discharged home, hemodynamically stable. Final Clinical Impressions(s) / UC Diagnoses   Final diagnoses:  Abdominal pain, left lower quadrant     Discharge Instructions      Advised patient of CT of abdomen and pelvis with IV and oral contrast results with hardcopy results provided.  Advised patient to take medication as directed with food to completion.  Encouraged increase daily water intake to 64 ounces per day while taking this medication.  Advised if symptoms worsen and/or unresolved please follow-up with PCP, GI or here for further evaluation.     ED  Prescriptions     Medication Sig Dispense Auth. Provider   predniSONE (STERAPRED UNI-PAK 21  TAB) 10 MG (21) TBPK tablet Take by mouth daily. Take 6 tabs by mouth daily  for 2 days, then 5 tabs for 2 days, then 4 tabs for 2 days, then 3 tabs for 2 days, 2 tabs for 2 days, then 1 tab by mouth daily for 2 days 42 tablet Trevor Iha, FNP      PDMP not reviewed this encounter.   Trevor Iha, FNP 03/24/23 1441

## 2023-07-14 ENCOUNTER — Other Ambulatory Visit: Payer: Self-pay | Admitting: *Deleted

## 2023-07-14 DIAGNOSIS — R911 Solitary pulmonary nodule: Secondary | ICD-10-CM

## 2023-07-15 ENCOUNTER — Ambulatory Visit: Admitting: Medical-Surgical

## 2023-07-22 ENCOUNTER — Telehealth: Admitting: Nurse Practitioner

## 2023-07-22 DIAGNOSIS — H60311 Diffuse otitis externa, right ear: Secondary | ICD-10-CM

## 2023-07-22 MED ORDER — NAPROXEN 500 MG PO TABS: 500.0000 mg | ORAL_TABLET | Freq: Two times a day (BID) | ORAL | 0 refills | Status: AC

## 2023-07-22 MED ORDER — CIPROFLOXACIN-DEXAMETHASONE 0.3-0.1 % OT SUSP: 4.0000 [drp] | Freq: Two times a day (BID) | OTIC | 0 refills | Status: AC

## 2023-07-22 NOTE — Progress Notes (Signed)
E Visit for Ear Pain   We are sorry that you are not feeling well. Here is how we plan to help!  Based on what you have shared with me it looks like you have an external ear infection. This is a redness or swelling, irritation, or infection of your outer ear canal. Your ear canal is a tube that goes from the opening of the ear to the eardrum.  When water stays in your ear canal, germs can grow.  This is a painful condition that often happens to children and swimmers of all ages.  It is not contagious and oral antibiotics are not required to treat typically  The usual symptoms include:    Itchiness inside the ear  Redness or a sense of swelling in the ear  Pain when the ear is tugged on when pressure is placed on the ear  Pus draining from the infected ear   We will prescribe; Meds ordered this encounter  Medications   naproxen (NAPROSYN) 500 MG tablet    Sig: Take 1 tablet (500 mg total) by mouth 2 (two) times daily with a meal for 5 days.    Dispense:  10 tablet    Refill:  0   ciprofloxacin-dexamethasone (CIPRODEX) OTIC suspension    Sig: Place 4 drops into the right ear 2 (two) times daily for 7 days.    Dispense:  7.5 mL    Refill:  0     In certain cases, this may progress to a more serious bacterial infection of the middle or inner ear.  If you have a fever 102 and up and significantly worsening symptoms, this could indicate a more serious infection moving to the middle/inner and needs face to face evaluation in an office by a provider.  Your symptoms should improve over the next 3 days and should resolve in about 7 days.  Be sure to complete ALL of your prescription.  HOME CARE: Wash your hands frequently. If you are prescribed an ear drop, do not place the tip of the bottle on your ear or touch it with your fingers. You can take Acetaminophen 650 mg every 4-6 hours as needed for pain.  If pain is severe or moderate, you can apply a heating pad (set on low) or hot water bottle  (wrapped in a towel) to outer ear for 20 minutes.  This will also increase drainage. Avoid ear plugs Do not go swimming until the symptoms are gone Do not use Q-tips After showers, help the water run out by tilting your head to one side.   GET HELP RIGHT AWAY IF: Fever is over 102.2 degrees. You develop progressive ear pain or hearing loss. Ear symptoms persist longer than 3 days after treatment.  MAKE SURE YOU: Understand these instructions. Will watch your condition. Will get help right away if you are not doing well or get worse.    Thank you for choosing an e-visit.  Your e-visit answers were reviewed by a board certified advanced clinical practitioner to complete your personal care plan. Depending upon the condition, your plan could have included both over the counter or prescription medications.  Please review your pharmacy choice. Make sure the pharmacy is open so you can pick up the prescription now. If there is a problem, you may contact your provider through [employer] and have the prescription routed to another pharmacy.  Your safety is important to [place]. If you have drug allergies check your prescription carefully.   For  the next 24 hours you can use MyChart to ask questions about today's visit, request a non-urgent call back, or ask for a work or school excuse. You will get an email with a survey after your eVisit asking about your experience. We would appreciate your feedback. I hope that your e-visit has been valuable and will aid in your recovery.   I spent approximately 5 minutes reviewing the patient's history, current symptoms and coordinating their care today.

## 2023-08-23 ENCOUNTER — Other Ambulatory Visit: Payer: Self-pay | Admitting: Medical-Surgical

## 2023-08-23 DIAGNOSIS — I10 Essential (primary) hypertension: Secondary | ICD-10-CM

## 2023-08-25 ENCOUNTER — Other Ambulatory Visit: Payer: Self-pay | Admitting: Medical-Surgical

## 2023-08-25 DIAGNOSIS — I10 Essential (primary) hypertension: Secondary | ICD-10-CM

## 2023-09-28 ENCOUNTER — Other Ambulatory Visit

## 2023-10-05 ENCOUNTER — Ambulatory Visit

## 2023-10-05 DIAGNOSIS — R918 Other nonspecific abnormal finding of lung field: Secondary | ICD-10-CM

## 2023-10-05 DIAGNOSIS — R911 Solitary pulmonary nodule: Secondary | ICD-10-CM

## 2023-10-16 ENCOUNTER — Encounter: Payer: Self-pay | Admitting: *Deleted
